# Patient Record
Sex: Male | Born: 1965 | Race: Black or African American | Hispanic: No | Marital: Single | Smoking: Light tobacco smoker
Health system: Southern US, Community
[De-identification: ages and names within clinical notes are randomized; demographics above are authoritative.]

## PROBLEM LIST (undated history)

## (undated) DIAGNOSIS — M5430 Sciatica, unspecified side: Secondary | ICD-10-CM

---

## 2018-11-02 ENCOUNTER — Other Ambulatory Visit: Payer: Self-pay

## 2018-11-02 ENCOUNTER — Emergency Department (HOSPITAL_BASED_OUTPATIENT_CLINIC_OR_DEPARTMENT_OTHER)
Admission: EM | Admit: 2018-11-02 | Discharge: 2018-11-02 | Disposition: A | Discharge status: Home / Self Care | Payer: Self-pay | Attending: Emergency Medicine | Admitting: Emergency Medicine

## 2018-11-02 ENCOUNTER — Encounter (HOSPITAL_BASED_OUTPATIENT_CLINIC_OR_DEPARTMENT_OTHER): Payer: Self-pay | Admitting: Emergency Medicine

## 2018-11-02 DIAGNOSIS — M5431 Sciatica, right side: Secondary | ICD-10-CM | POA: Insufficient documentation

## 2018-11-02 DIAGNOSIS — M5432 Sciatica, left side: Secondary | ICD-10-CM | POA: Insufficient documentation

## 2018-11-02 DIAGNOSIS — Z720 Tobacco use: Secondary | ICD-10-CM | POA: Insufficient documentation

## 2018-11-02 HISTORY — DX: Sciatica, unspecified side: M54.30

## 2018-11-02 MED ORDER — OXYCODONE-ACETAMINOPHEN 5-325 MG PO TABS: 1.0000 | ORAL_TABLET | ORAL | 0 refills | Status: DC | PRN

## 2018-11-02 MED ORDER — KETOROLAC TROMETHAMINE 30 MG/ML IJ SOLN
30.0000 mg | Freq: Once | INTRAMUSCULAR | Status: AC
  Administered 2018-11-02: 30 mg via INTRAMUSCULAR
  Filled 2018-11-02: qty 1

## 2018-11-02 MED ORDER — MELOXICAM 15 MG PO TABS: ORAL_TABLET | ORAL | 0 refills | Status: DC

## 2018-11-02 NOTE — ED Provider Notes (Signed)
[facility] Provider Note: Lowella Dell, MD, FACEP  CSN: [id number] MRN: [id number] ARRIVAL: 11/02/18 at 0607 ROOM: [facility]   CHIEF COMPLAINT  Back Pain   HISTORY OF PRESENT ILLNESS  11/02/18 6:37 AM Joseph Reeves is a 53 y.o. male with a history of sciatica.  He is here with low back pain that began about a week ago.  The onset was gradual but it is acutely worsened over the past 2 days.  He denies any specific injury or lifting that precipitated this.  The pain is located in his lumbar region bilaterally radiating into his buttocks bilaterally.  He rates his pain as a 10 out of 10 at its worst.  Pain is exacerbated with movement but not with palpation.  He denies any associated numbness, weakness, bowel or bladder changes.  He has been taking ibuprofen without adequate relief.   Past Medical History:  Diagnosis Date  . Sciatica     History reviewed. No pertinent surgical history.  History reviewed. No pertinent family history.  Social History   Tobacco Use  . Smoking status: Light Tobacco Smoker  . Smokeless tobacco: Never Used  Substance Use Topics  . Alcohol use: Never    Frequency: Never  . Drug use: Never    Prior to Admission medications   Not on File    Allergies Patient has no known allergies.   REVIEW OF SYSTEMS  Negative except as noted here or in the History of Present Illness.   PHYSICAL EXAMINATION  Initial Vital Signs Blood pressure (!) 143/82, pulse 76, temperature 99 F (37.2 C), temperature source Oral, resp. rate 16, height 6\' 1"  (1.854 m), weight 106.6 kg, SpO2 100 %.  Examination General: Well-developed, well-nourished male in no acute distress; appearance consistent with age of record HENT: normocephalic; atraumatic Eyes: pupils equal, round and reactive to light; extraocular muscles intact; mild arcus senilis bilaterally Neck: supple Heart: regular rate and rhythm Lungs: clear to auscultation bilaterally  Abdomen: soft; nondistended; nontender; bowel sounds present Back: Nontender; positive straight leg raise bilaterally at 35 degrees Extremities: No deformity; full range of motion; pulses normal Neurologic: Awake, alert and oriented; motor function intact in all extremities and symmetric; sensation intact and symmetrical in lower extremities; no saddle anesthesia; no facial droop Skin: Warm and dry Psychiatric: Normal mood and affect   RESULTS  Summary of this visit's results, reviewed by myself:   EKG Interpretation  Date/Time:    Ventricular Rate:    PR Interval:    QRS Duration:   QT Interval:    QTC Calculation:   R Axis:     Text Interpretation:        Laboratory Studies: No results found for this or any previous visit (from the past 24 hour(s)). Imaging Studies: No results found.  ED COURSE and MDM  Nursing notes and initial vitals signs, including pulse oximetry, reviewed.  Vitals:   11/02/18 0619 11/02/18 0622  BP:  (!) 143/82  Pulse:  76  Resp:  16  Temp:  99 F (37.2 C)  TempSrc:  Oral  SpO2:  100%  Weight: 106.6 kg   Height: 6\' 1"  (1.854 m)    Will treat with an NSAID and narcotic and refer to sports medicine if symptoms persist.  Examination and history consistent with acute sciatica.  He has had this before and symptoms were similar.  He has no history of prostate cancer.  PROCEDURES    ED DIAGNOSES     ICD-10-CM  1. Bilateral sciatica  M54.31    M54.32        Joseph Rosser, MD 11/02/18 [phone]

## 2018-11-02 NOTE — ED Triage Notes (Signed)
Patient complains of lower back pain radiating into bilateral legs; more so on right side. States onset 2 days ago; worse this am; Difficulty with ambulation due to pain.

## 2018-11-04 ENCOUNTER — Encounter (HOSPITAL_BASED_OUTPATIENT_CLINIC_OR_DEPARTMENT_OTHER): Payer: Self-pay | Admitting: Emergency Medicine

## 2018-11-04 ENCOUNTER — Other Ambulatory Visit: Payer: Self-pay

## 2018-11-04 ENCOUNTER — Emergency Department (HOSPITAL_BASED_OUTPATIENT_CLINIC_OR_DEPARTMENT_OTHER): Payer: Self-pay

## 2018-11-04 ENCOUNTER — Emergency Department (HOSPITAL_BASED_OUTPATIENT_CLINIC_OR_DEPARTMENT_OTHER)
Admission: EM | Admit: 2018-11-04 | Discharge: 2018-11-04 | Disposition: A | Discharge status: Home / Self Care | Payer: Self-pay | Attending: Emergency Medicine | Admitting: Emergency Medicine

## 2018-11-04 DIAGNOSIS — M5416 Radiculopathy, lumbar region: Secondary | ICD-10-CM | POA: Insufficient documentation

## 2018-11-04 DIAGNOSIS — Z720 Tobacco use: Secondary | ICD-10-CM | POA: Insufficient documentation

## 2018-11-04 DIAGNOSIS — M48061 Spinal stenosis, lumbar region without neurogenic claudication: Secondary | ICD-10-CM | POA: Insufficient documentation

## 2018-11-04 IMAGING — MR MR LUMBAR SPINE W/O CM
4 of 5 series · 24 of 48 positions shown · non-contrast
Comparison: None.

CLINICAL DATA: Severe low back pain with bilateral lower extremity
numbness

EXAM:
MRI LUMBAR SPINE WITHOUT CONTRAST
TECHNIQUE: Multiplanar, multisequence MR imaging of the lumbar spine was
performed. No intravenous contrast was administered.

[Series 2: T1 · sagittal · 4.0mm · 0.88mm/px · 5 of 16 slices shown (1 of 2)]
[im 1/16]
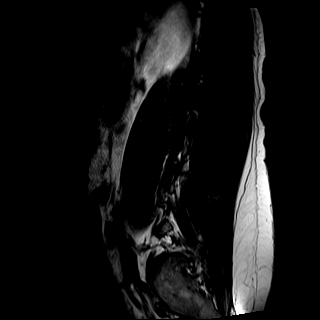
[im 4/16]
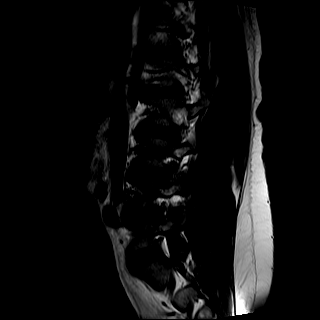
[im 8/16]
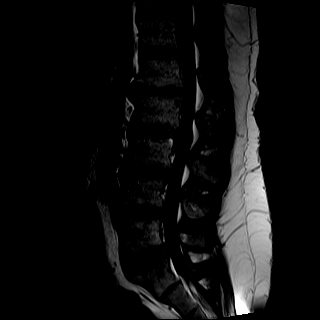
[im 12/16]
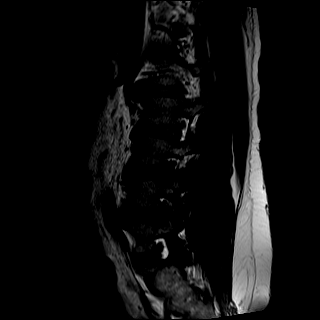
[im 16/16]
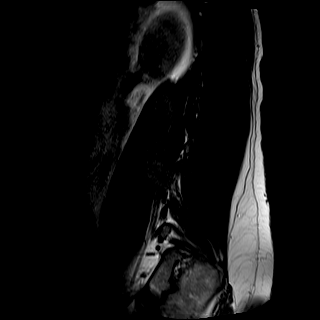

[Series 3: T2 · sagittal · 4.0mm · 0.88mm/px · 5 of 16 slices shown (1 of 2)]
[im 1/16]
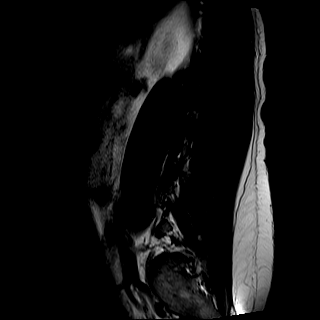
[im 4/16]
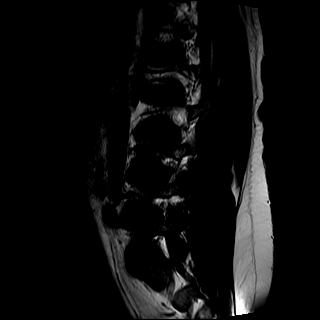
[im 8/16]
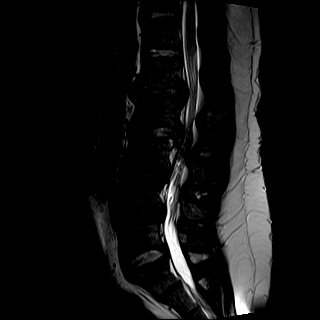
[im 12/16]
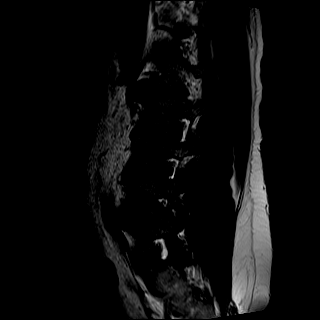
[im 16/16]
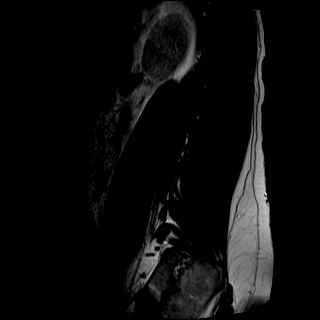

[Series 5: T2 · axial · 4.0mm · 0.39mm/px · z∈[-104,+100]mm · 10 of 44 slices shown (2 of 2)]
[im 3/44]
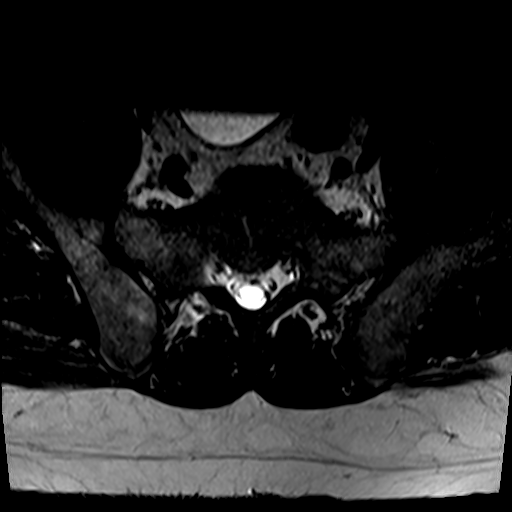
[im 6/44]
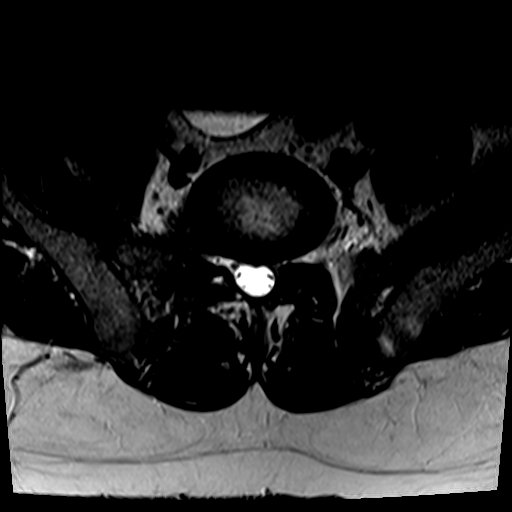
[im 9/44]
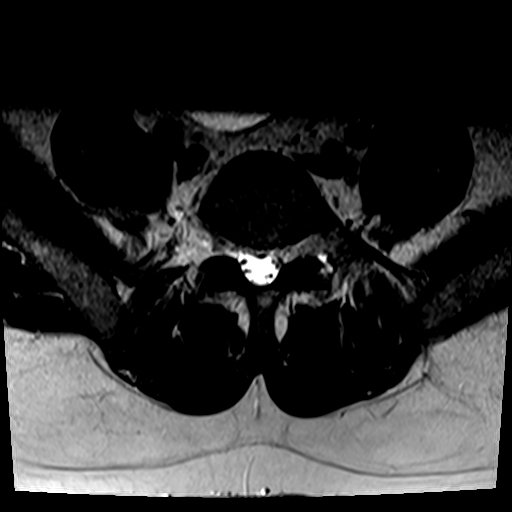
[im 15/44]
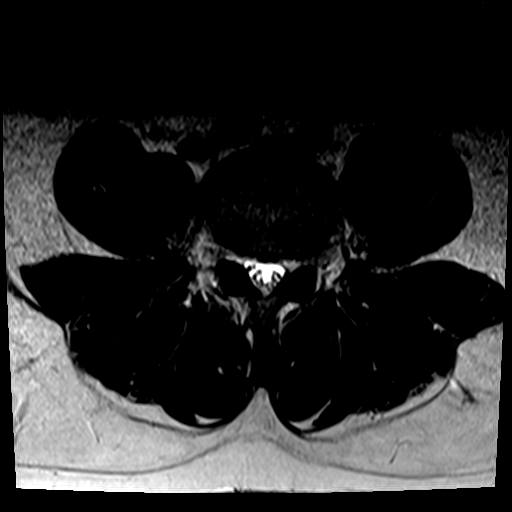
[im 21/44]
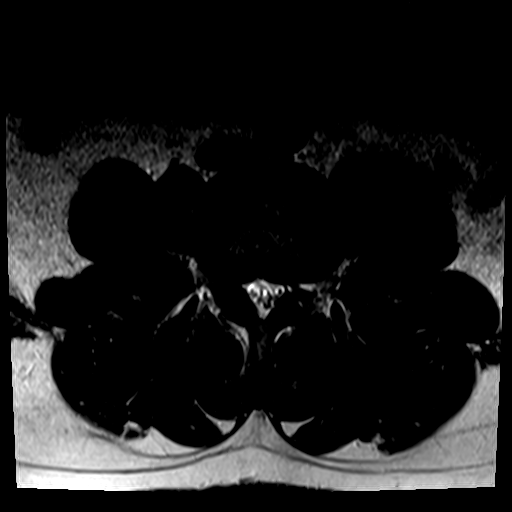
[im 23/44]
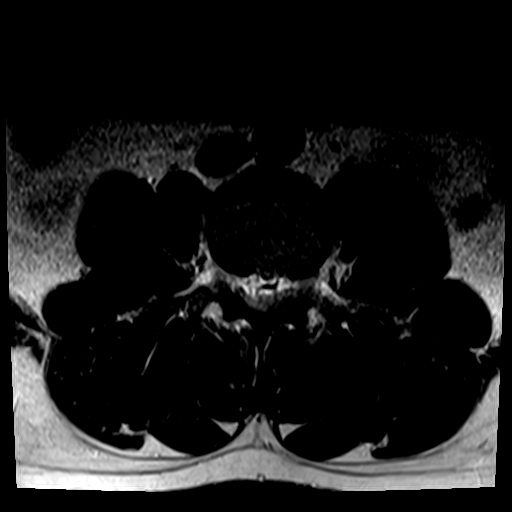
[im 26/44]
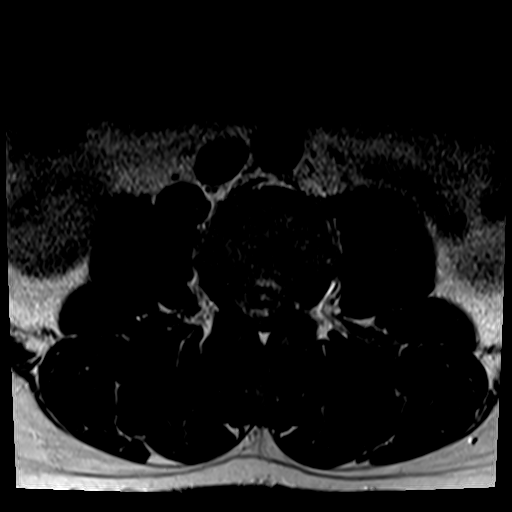
[im 32/44]
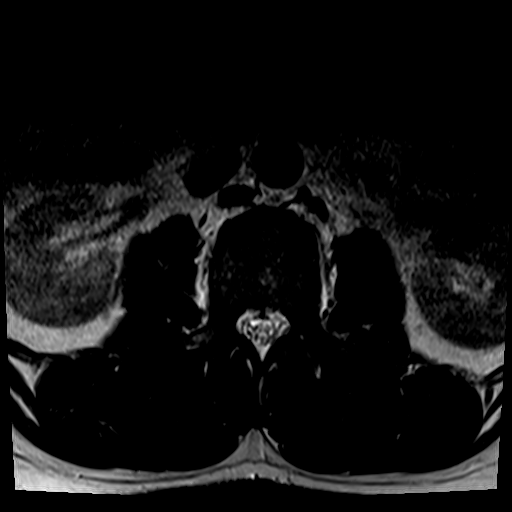
[im 38/44]
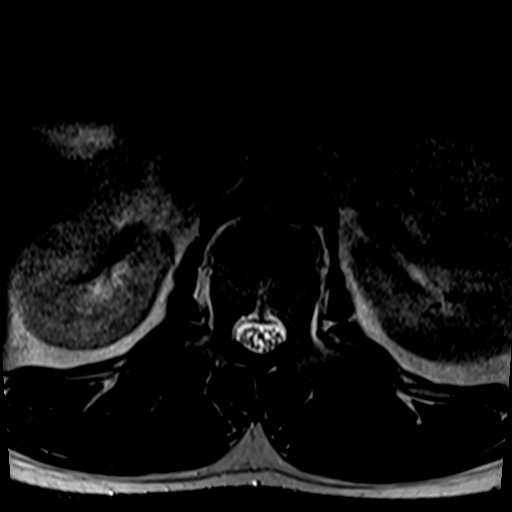
[im 44/44]
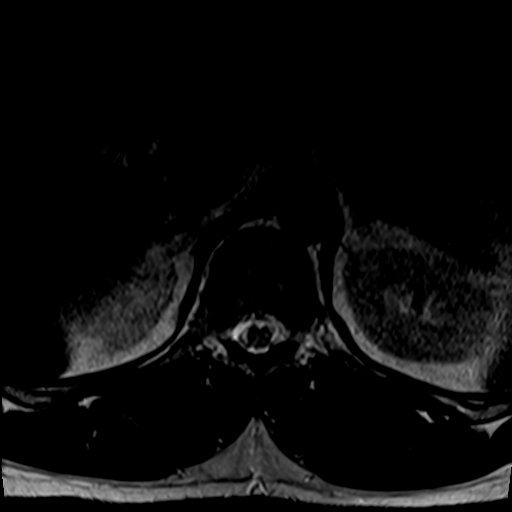

[Series 6: T1 · axial · 4.0mm · 0.39mm/px · z∈[-104,+70]mm · 4 of 44 slices shown (2 of 2)]
[im 3/44]
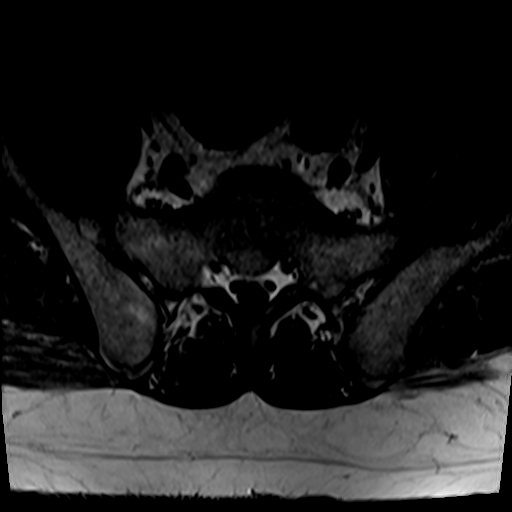
[im 6/44]
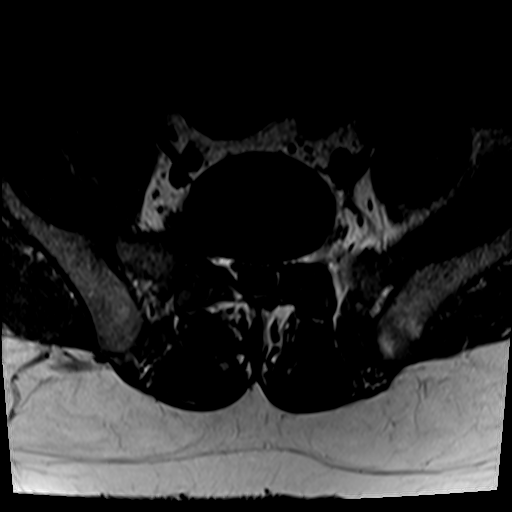
[im 23/44]
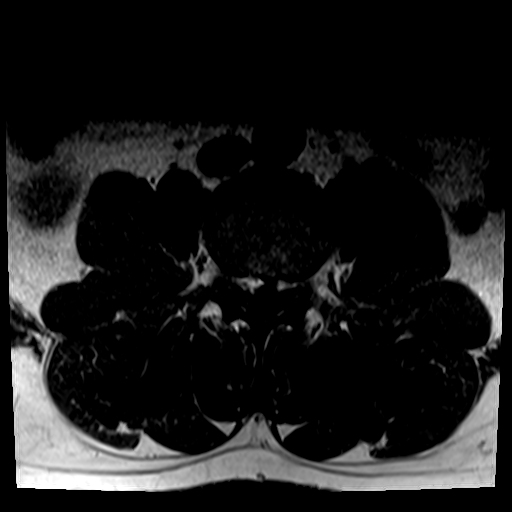
[im 38/44]
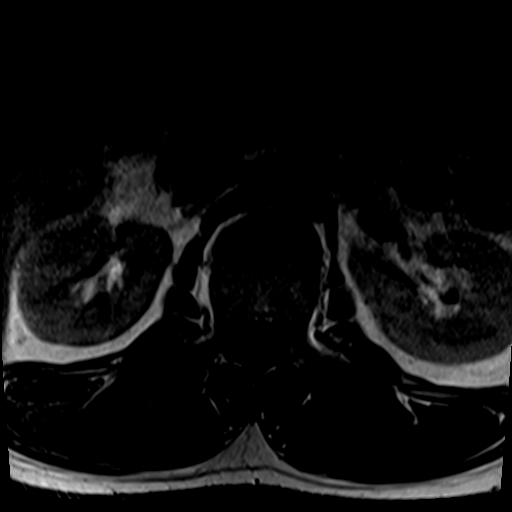

[24 of 48 positions shown; findings below may reference images not displayed]

FINDINGS: Segmentation:  Standard.

Alignment: Straightening of the lumbar lordosis without static
listhesis.

Vertebrae: No fracture, evidence of discitis, or bone lesion. Mild
diffuse intrinsic canal narrowing on the basis of congenitally short
pedicles. Multilevel degenerative endplate spurring.

Conus medullaris and cauda equina: Conus extends to the L1 level.
Conus and cauda equina appear normal.

Paraspinal and other soft tissues: Partially visualized large T2
hyperintense structure within the pelvis, likely a distended urinary
bladder.

Disc levels:

T12-L1: Mild bilateral facet arthrosis. No disc protrusion. No
foraminal or canal stenosis.

L1-L2: Diffuse disc bulge with endplate spurring and mild bilateral
facet arthrosis results in mild canal stenosis and minimal bilateral
foraminal narrowing without evidence of neural impingement.

L2-L3: Central disc extrusion with 6 mm caudal extension.
Degenerative endplate spurring and mild bilateral facet arthrosis
contribute to severe canal stenosis and moderate right foraminal
stenosis.

L3-L4: Diffuse disc bulge with endplate spurring and mild bilateral
facet arthrosis resulting in severe right and moderate left
foraminal stenosis. Mild canal stenosis.

L4-L5: Diffuse disc bulge and degenerative endplate spurring with
mild bilateral facet arthrosis result in severe bilateral foraminal
stenosis with mild canal stenosis.

L5-S1: Small central disc protrusion and mild bilateral facet
arthrosis no significant foraminal or canal stenosis.
IMPRESSION: 1. Congenitally narrow canal with superimposed lumbar spondylosis
resulting in severe canal stenosis at L2-L3 and mild canal stenosis
at L1-2, L3-L4, and L4-L5.
2. Multilevel foraminal stenosis, severe on the right at L3-4 and
bilaterally at L4-5.
3. Partially visualized large T2 hyperintense structure within the
pelvis, likely a distended urinary bladder.

## 2018-11-04 MED ORDER — DIAZEPAM 5 MG PO TABS
5.0000 mg | ORAL_TABLET | Freq: Once | ORAL | Status: AC
  Administered 2018-11-04: 5 mg via ORAL
  Filled 2018-11-04: qty 1

## 2018-11-04 MED ORDER — PREDNISONE 20 MG PO TABS: ORAL_TABLET | ORAL | 0 refills | Status: DC

## 2018-11-04 MED ORDER — PREDNISONE 50 MG PO TABS
60.0000 mg | ORAL_TABLET | Freq: Once | ORAL | Status: AC
  Administered 2018-11-04: 60 mg via ORAL
  Filled 2018-11-04: qty 1

## 2018-11-04 MED ORDER — DIAZEPAM 5 MG PO TABS: 5.0000 mg | ORAL_TABLET | Freq: Four times a day (QID) | ORAL | 0 refills | Status: DC | PRN

## 2018-11-04 MED ORDER — HYDROMORPHONE HCL 1 MG/ML IJ SOLN
1.0000 mg | Freq: Once | INTRAMUSCULAR | Status: AC
  Administered 2018-11-04: 14:00:00 1 mg via INTRAVENOUS
  Filled 2018-11-04: qty 1

## 2018-11-04 NOTE — ED Notes (Signed)
Patient transported to MRI 

## 2018-11-04 NOTE — Discharge Instructions (Signed)
Continue oxycodone for pain as prescribed previously   Take prednisone as prescribed   Take valium for muscle spasms   See neurosurgery for follow up. You may need some procedures to help with your back problems.   Return to ER if you have worse back pain, numbness, weakness, trouble urinating

## 2018-11-04 NOTE — ED Provider Notes (Signed)
  Physical Exam  BP 136/73 (BP Location: Left Arm)   Pulse 67   Temp 98.4 F (36.9 C) (Oral)   Resp 18   Ht 6\' 1"  (1.854 m)   Wt 104.3 kg   SpO2 100%   BMI 30.34 kg/m   Physical Exam  ED Course/Procedures   Clinical Course as of Nov 04 1626  Sat Nov 03, 8448  [id number] 53 year old male with progressive worsening of low back pain radiating to his legs with associated numbness and tingling.  I have put him in for an MRI because he has some concerning symptoms.   [MB]    Clinical Course User Index [MB] Joseph Rasmussen, MD    Procedures  MDM  Care assumed at 3:30 pm. Patient here with worse back pain, numbness and tingling. Sign out pending MRI lumbar spine.   4:29 PM MRi lumbar spine showed multi level stenosis. No cord compression. Patient's pain improved with pain meds. He has oxycodone prescribed already. Will dc home with valium prn and prednisone. Will refer to neurosurgery for further management.    Drenda Freeze, MD 11/04/18 [phone]

## 2018-11-04 NOTE — ED Provider Notes (Signed)
[facility] EMERGENCY DEPARTMENT Provider Note   CSN: [id number] Arrival date & time: 11/04/18  1051     History   Chief Complaint No chief complaint on file.   HPI Joseph Reeves is a 53 y.o. male.  He has no significant past medical history.  He said he started with some low back pain radiating into his legs on Monday and has been getting progressively worse.  He was seen here 2 days ago for same and prescribed some pain medication and NSAIDs.  He says his symptoms are getting worse and he is having twitching in his legs along with numbness in his right foot and more difficulty walking.  He has had no bowel or bladder incontinence but he says he feels numb through that area.  Pins-and-needles in his buttocks.  No fevers or chills no abdominal pain.  Denies any IV drug use and not immune suppressed.  No trauma.     The history is provided by the patient.  Back Pain Location:  Gluteal region Quality:  Burning Radiates to:  L thigh, R thigh and R foot Pain severity:  Moderate Pain is:  Same all the time Onset quality:  Gradual Timing:  Constant Progression:  Worsening Chronicity:  Recurrent Context: not falling, not recent illness and not recent injury   Relieved by:  Nothing Worsened by:  Ambulation Ineffective treatments:  Ibuprofen and narcotics Associated symptoms: leg pain, numbness, paresthesias, perianal numbness and tingling   Associated symptoms: no abdominal pain, no bladder incontinence, no bowel incontinence, no chest pain, no dysuria, no fever and no headaches   Risk factors: no hx of cancer and no steroid use     Past Medical History:  Diagnosis Date  . Sciatica     There are no active problems to display for this patient.   History reviewed. No pertinent surgical history.      Home Medications    Prior to Admission medications   Medication Sig Start Date End Date Taking? Authorizing Provider  meloxicam (MOBIC) 15 MG tablet Take 1  tablet daily for back pain. 11/02/18  Yes Molpus, John, MD  oxyCODONE-acetaminophen (PERCOCET) 5-325 MG tablet Take 1 tablet by mouth every 4 (four) hours as needed for severe pain. 11/02/18  Yes Molpus, John, MD    Family History History reviewed. No pertinent family history.  Social History Social History   Tobacco Use  . Smoking status: Light Tobacco Smoker  . Smokeless tobacco: Never Used  Substance Use Topics  . Alcohol use: Never    Frequency: Never  . Drug use: Never     Allergies   Patient has no known allergies.   Review of Systems Review of Systems  Constitutional: Negative for fever.  HENT: Negative for sore throat.   Eyes: Negative for visual disturbance.  Respiratory: Negative for shortness of breath.   Cardiovascular: Negative for chest pain.  Gastrointestinal: Negative for abdominal pain and bowel incontinence.  Genitourinary: Negative for bladder incontinence and dysuria.  Musculoskeletal: Positive for back pain.  Skin: Negative for rash.  Neurological: Positive for tingling, numbness and paresthesias. Negative for headaches.     Physical Exam Updated Vital Signs BP (!) 143/97 (BP Location: Right Arm)   Pulse 65   Temp 98.8 F (37.1 C) (Oral)   Resp 20   Ht 6\' 1"  (1.854 m)   Wt 104.3 kg   SpO2 97%   BMI 30.34 kg/m   Physical Exam Vitals signs and nursing note reviewed.  Constitutional:  Appearance: He is well-developed.  HENT:     Head: Normocephalic and atraumatic.  Eyes:     Conjunctiva/sclera: Conjunctivae normal.  Neck:     Musculoskeletal: Neck supple.  Cardiovascular:     Rate and Rhythm: Normal rate and regular rhythm.     Heart sounds: No murmur.  Pulmonary:     Effort: Pulmonary effort is normal. No respiratory distress.     Breath sounds: Normal breath sounds.  Abdominal:     Palpations: Abdomen is soft.     Tenderness: There is no abdominal tenderness.  Musculoskeletal: Normal range of motion.  Skin:    General:  Skin is warm and dry.     Capillary Refill: Capillary refill takes less than 2 seconds.  Neurological:     Mental Status: He is alert.     Motor: No weakness.      ED Treatments / Results  Labs (all labs ordered are listed, but only abnormal results are displayed) Labs Reviewed - No data to display  EKG None  Radiology Mr Lumbar Spine Wo Contrast  Result Date: 11/04/2018 CLINICAL DATA:  Severe low back pain with bilateral lower extremity numbness EXAM: MRI LUMBAR SPINE WITHOUT CONTRAST TECHNIQUE: Multiplanar, multisequence MR imaging of the lumbar spine was performed. No intravenous contrast was administered. COMPARISON:  None. FINDINGS: Segmentation:  Standard. Alignment: Straightening of the lumbar lordosis without static listhesis. Vertebrae: No fracture, evidence of discitis, or bone lesion. Mild diffuse intrinsic canal narrowing on the basis of congenitally short pedicles. Multilevel degenerative endplate spurring. Conus medullaris and cauda equina: Conus extends to the L1 level. Conus and cauda equina appear normal. Paraspinal and other soft tissues: Partially visualized large T2 hyperintense structure within the pelvis, likely a distended urinary bladder. Disc levels: T12-L1: Mild bilateral facet arthrosis. No disc protrusion. No foraminal or canal stenosis. L1-L2: Diffuse disc bulge with endplate spurring and mild bilateral facet arthrosis results in mild canal stenosis and minimal bilateral foraminal narrowing without evidence of neural impingement. L2-L3: Central disc extrusion with 6 mm caudal extension. Degenerative endplate spurring and mild bilateral facet arthrosis contribute to severe canal stenosis and moderate right foraminal stenosis. L3-L4: Diffuse disc bulge with endplate spurring and mild bilateral facet arthrosis resulting in severe right and moderate left foraminal stenosis. Mild canal stenosis. L4-L5: Diffuse disc bulge and degenerative endplate spurring with mild  bilateral facet arthrosis result in severe bilateral foraminal stenosis with mild canal stenosis. L5-S1: Small central disc protrusion and mild bilateral facet arthrosis no significant foraminal or canal stenosis. IMPRESSION: 1. 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 2. Multilevel foraminal stenosis, severe on the right at L3-4 and bilaterally at L4-5. 3. Partially visualized large T2 hyperintense structure within the pelvis, likely a distended urinary bladder. Electronically Signed   By: Duanne Guess M.D.   On: 11/04/2018 16:03    Procedures Procedures (including critical care time)  Medications Ordered in ED Medications  diazepam (VALIUM) tablet 5 mg (5 mg Oral Given 11/04/18 1114)  HYDROmorphone (DILAUDID) injection 1 mg (1 mg Intravenous Given 11/04/18 1422)  predniSONE (DELTASONE) tablet 60 mg (60 mg Oral Given 11/04/18 1636)     Initial Impression / Assessment and Plan / ED Course  I have reviewed the triage vital signs and the nursing notes.  Pertinent labs & imaging results that were available during my care of the patient were reviewed by me and considered in my  medical decision making (see chart for details).  Clinical Course as of Nov 03 1701  Sat Nov 04, 2018  [id number] 53 year old male with progressive worsening of low back pain radiating to his legs with associated numbness and tingling.  I have put him in for an MRI because he has some concerning symptoms.   [MB]    Clinical Course User Index [MB] Terrilee FilesButler,  C, MD      Signed out to Dr Silverio LayYao with MRI to followup on. Dispo per results of MRI.   Final Clinical Impressions(s) / ED Diagnoses   Final diagnoses:  Spinal stenosis of lumbar region, unspecified whether neurogenic claudication present  Lumbar radiculopathy    ED Discharge Orders         Ordered    diazepam (VALIUM) 5 MG tablet  Every 6  hours PRN     11/04/18 1631    predniSONE (DELTASONE) 20 MG tablet     11/04/18 1633           Terrilee FilesButler,  C, MD 11/04/18 1705

## 2018-11-04 NOTE — ED Notes (Signed)
ED Provider at bedside. 

## 2018-11-04 NOTE — ED Notes (Signed)
Pt in MRI, family updated

## 2018-11-04 NOTE — ED Triage Notes (Signed)
Pt here with recurring leg cramping and heaviness x 2 days. Muscles in thighs are visibly twitching in triage.

## 2018-11-06 ENCOUNTER — Emergency Department (HOSPITAL_BASED_OUTPATIENT_CLINIC_OR_DEPARTMENT_OTHER): Payer: Self-pay

## 2018-11-06 ENCOUNTER — Other Ambulatory Visit: Payer: Self-pay

## 2018-11-06 ENCOUNTER — Encounter (HOSPITAL_BASED_OUTPATIENT_CLINIC_OR_DEPARTMENT_OTHER): Payer: Self-pay | Admitting: *Deleted

## 2018-11-06 ENCOUNTER — Inpatient Hospital Stay (HOSPITAL_BASED_OUTPATIENT_CLINIC_OR_DEPARTMENT_OTHER)
Admission: EM | Admit: 2018-11-06 | Discharge: 2018-11-13 | DRG: 454 | Disposition: A | Discharge status: Rehabilitation Facility | Payer: Self-pay | Attending: Internal Medicine | Admitting: Internal Medicine

## 2018-11-06 ENCOUNTER — Emergency Department (HOSPITAL_COMMUNITY): Payer: Self-pay

## 2018-11-06 DIAGNOSIS — R202 Paresthesia of skin: Secondary | ICD-10-CM | POA: Diagnosis present

## 2018-11-06 DIAGNOSIS — M5124 Other intervertebral disc displacement, thoracic region: Secondary | ICD-10-CM | POA: Diagnosis present

## 2018-11-06 DIAGNOSIS — Z8249 Family history of ischemic heart disease and other diseases of the circulatory system: Secondary | ICD-10-CM

## 2018-11-06 DIAGNOSIS — R509 Fever, unspecified: Secondary | ICD-10-CM | POA: Diagnosis not present

## 2018-11-06 DIAGNOSIS — R338 Other retention of urine: Secondary | ICD-10-CM | POA: Diagnosis present

## 2018-11-06 DIAGNOSIS — K592 Neurogenic bowel, not elsewhere classified: Secondary | ICD-10-CM

## 2018-11-06 DIAGNOSIS — M48062 Spinal stenosis, lumbar region with neurogenic claudication: Principal | ICD-10-CM | POA: Diagnosis present

## 2018-11-06 DIAGNOSIS — M5186 Other intervertebral disc disorders, lumbar region: Secondary | ICD-10-CM | POA: Diagnosis present

## 2018-11-06 DIAGNOSIS — N312 Flaccid neuropathic bladder, not elsewhere classified: Secondary | ICD-10-CM

## 2018-11-06 DIAGNOSIS — R29898 Other symptoms and signs involving the musculoskeletal system: Secondary | ICD-10-CM | POA: Diagnosis present

## 2018-11-06 DIAGNOSIS — D62 Acute posthemorrhagic anemia: Secondary | ICD-10-CM | POA: Diagnosis not present

## 2018-11-06 DIAGNOSIS — R200 Anesthesia of skin: Secondary | ICD-10-CM | POA: Diagnosis present

## 2018-11-06 DIAGNOSIS — Z419 Encounter for procedure for purposes other than remedying health state, unspecified: Secondary | ICD-10-CM

## 2018-11-06 DIAGNOSIS — G834 Cauda equina syndrome: Secondary | ICD-10-CM

## 2018-11-06 DIAGNOSIS — Z20828 Contact with and (suspected) exposure to other viral communicable diseases: Secondary | ICD-10-CM | POA: Diagnosis present

## 2018-11-06 DIAGNOSIS — M47816 Spondylosis without myelopathy or radiculopathy, lumbar region: Secondary | ICD-10-CM | POA: Diagnosis present

## 2018-11-06 DIAGNOSIS — F17290 Nicotine dependence, other tobacco product, uncomplicated: Secondary | ICD-10-CM | POA: Diagnosis present

## 2018-11-06 DIAGNOSIS — M5126 Other intervertebral disc displacement, lumbar region: Secondary | ICD-10-CM | POA: Diagnosis present

## 2018-11-06 LAB — CBC WITH DIFFERENTIAL/PLATELET
Abs Immature Granulocytes: 0.03 10*3/uL (ref 0.00–0.07)
Basophils Absolute: 0 10*3/uL (ref 0.0–0.1)
Basophils Relative: 0 %
Eosinophils Absolute: 0 10*3/uL (ref 0.0–0.5)
Eosinophils Relative: 0 %
HCT: 39.4 % (ref 39.0–52.0)
Hemoglobin: 12.7 g/dL — ABNORMAL LOW (ref 13.0–17.0)
Immature Granulocytes: 0 %
Lymphocytes Relative: 10 %
Lymphs Abs: 1.4 10*3/uL (ref 0.7–4.0)
MCH: 31.5 pg (ref 26.0–34.0)
MCHC: 32.2 g/dL (ref 30.0–36.0)
MCV: 97.8 fL (ref 80.0–100.0)
Monocytes Absolute: 0.9 10*3/uL (ref 0.1–1.0)
Monocytes Relative: 7 %
Neutro Abs: 11.2 10*3/uL — ABNORMAL HIGH (ref 1.7–7.7)
Neutrophils Relative %: 83 %
Platelets: 261 10*3/uL (ref 150–400)
RBC: 4.03 MIL/uL — ABNORMAL LOW (ref 4.22–5.81)
RDW: 12.6 % (ref 11.5–15.5)
WBC: 13.5 10*3/uL — ABNORMAL HIGH (ref 4.0–10.5)
nRBC: 0 % (ref 0.0–0.2)

## 2018-11-06 LAB — COMPREHENSIVE METABOLIC PANEL
ALT: 22 U/L (ref 0–44)
AST: 26 U/L (ref 15–41)
Albumin: 4.4 g/dL (ref 3.5–5.0)
Alkaline Phosphatase: 35 U/L — ABNORMAL LOW (ref 38–126)
Anion gap: 12 (ref 5–15)
BUN: 27 mg/dL — ABNORMAL HIGH (ref 6–20)
CO2: 27 mmol/L (ref 22–32)
Calcium: 9.1 mg/dL (ref 8.9–10.3)
Chloride: 102 mmol/L (ref 98–111)
Creatinine, Ser: 1.11 mg/dL (ref 0.61–1.24)
GFR calc Af Amer: >60 mL/min (ref >60)
GFR calc non Af Amer: >60 mL/min (ref >60)
Glucose, Bld: 130 mg/dL — ABNORMAL HIGH (ref 70–99)
Potassium: 3.7 mmol/L (ref 3.5–5.1)
Sodium: 141 mmol/L (ref 135–145)
Total Bilirubin: 0.5 mg/dL (ref 0.3–1.2)
Total Protein: 7.7 g/dL (ref 6.5–8.1)

## 2018-11-06 LAB — LIPASE, BLOOD: Lipase: 22 U/L (ref 11–51)

## 2018-11-06 LAB — URINALYSIS, ROUTINE W REFLEX MICROSCOPIC
Bilirubin Urine: NEGATIVE
Glucose, UA: NEGATIVE mg/dL
Ketones, ur: NEGATIVE mg/dL
Leukocytes,Ua: NEGATIVE
Nitrite: NEGATIVE
Protein, ur: NEGATIVE mg/dL
Specific Gravity, Urine: >1.03 — ABNORMAL HIGH (ref 1.005–1.030)
pH: 5.5 (ref 5.0–8.0)

## 2018-11-06 LAB — RAPID URINE DRUG SCREEN, HOSP PERFORMED
Amphetamines: NOT DETECTED
Barbiturates: NOT DETECTED
Benzodiazepines: POSITIVE — AB
Cocaine: NOT DETECTED
Opiates: NOT DETECTED
Tetrahydrocannabinol: NOT DETECTED

## 2018-11-06 LAB — SEDIMENTATION RATE: Sed Rate: 15 mm/hr (ref 0–16)

## 2018-11-06 LAB — URINALYSIS, MICROSCOPIC (REFLEX)

## 2018-11-06 LAB — C-REACTIVE PROTEIN: CRP: <0.8 mg/dL (ref <1.0)

## 2018-11-06 LAB — HIV ANTIBODY (ROUTINE TESTING W REFLEX): HIV Screen 4th Generation wRfx: NONREACTIVE

## 2018-11-06 LAB — SARS CORONAVIRUS 2 BY RT PCR (HOSPITAL ORDER, PERFORMED IN ~~LOC~~ HOSPITAL LAB): SARS Coronavirus 2: NEGATIVE

## 2018-11-06 IMAGING — DX DG CHEST 2V
3 series · 3 of 3 positions shown · non-contrast
Comparison: None

CLINICAL DATA: Leg weakness, light smoker

EXAM:
CHEST - 2 VIEW

[chest lat (1 of 2)]
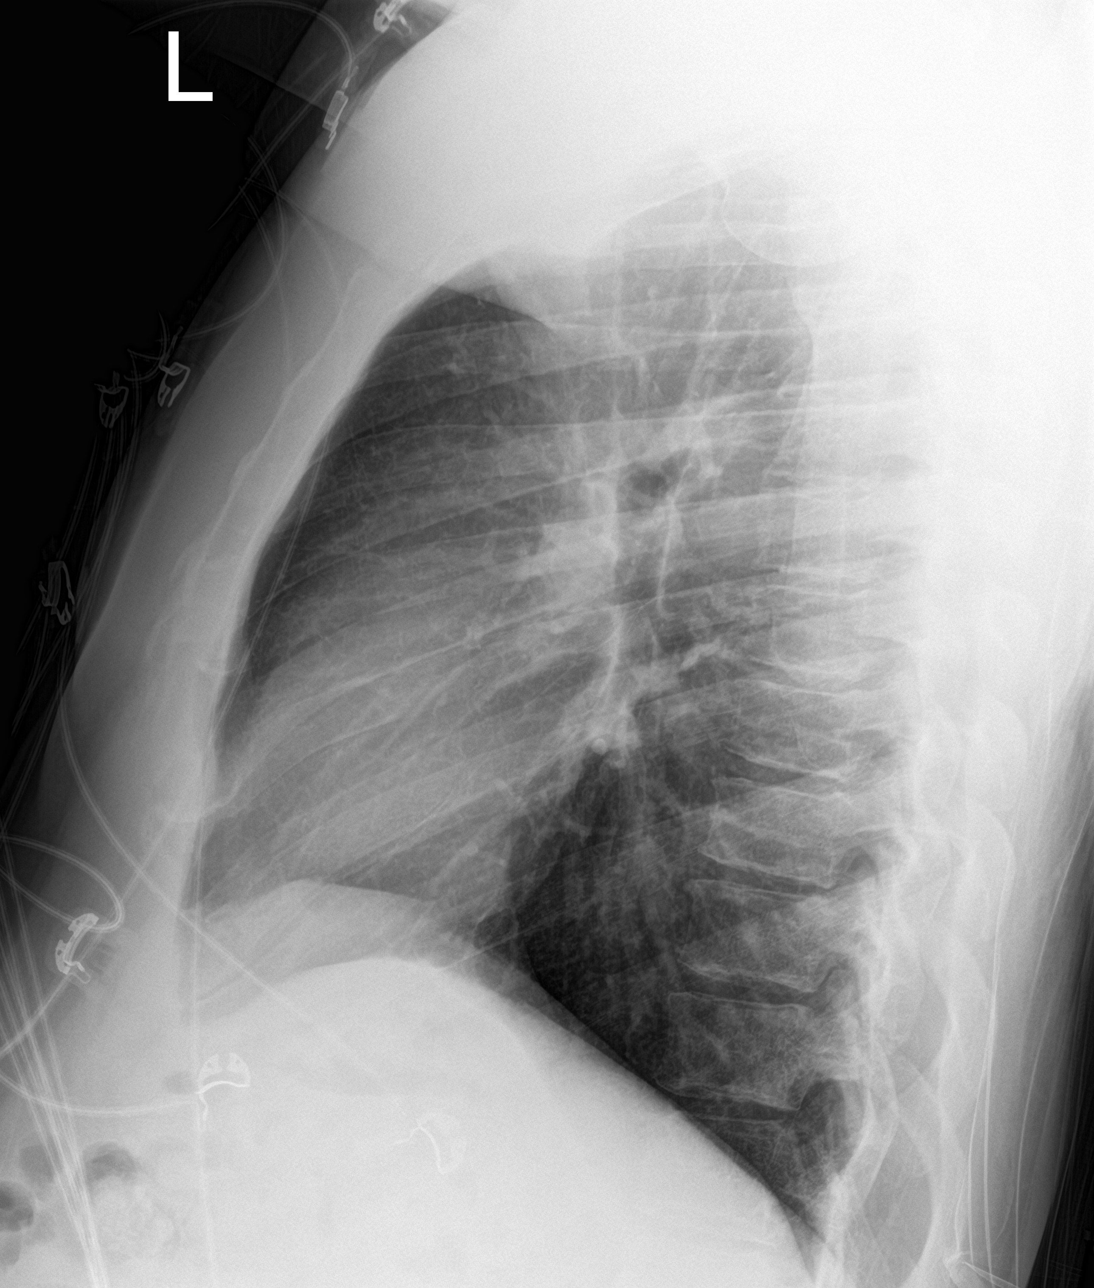

[chest ap strecther]
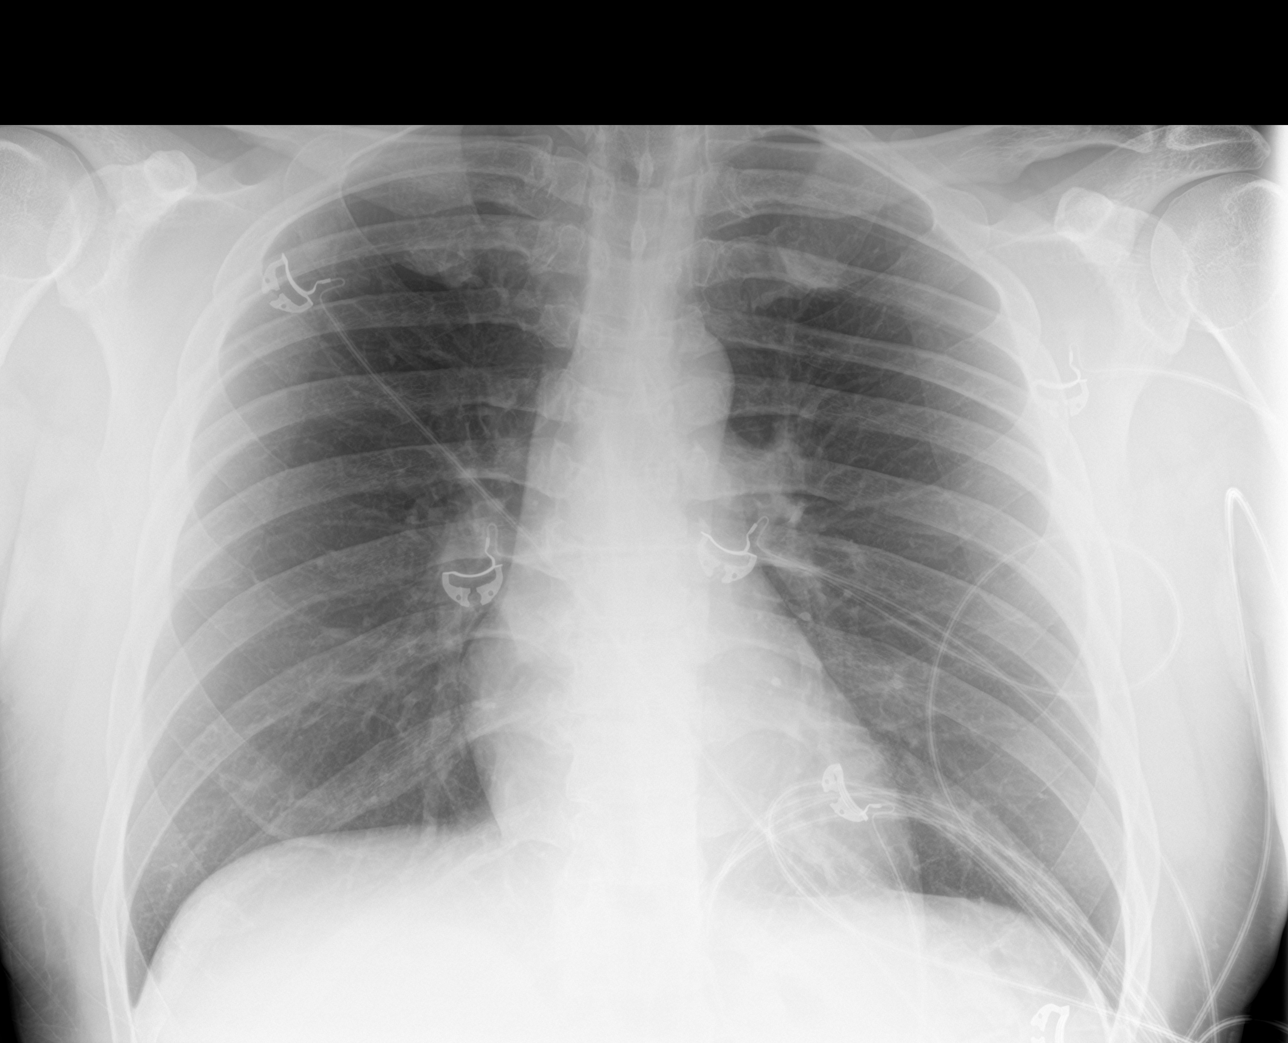

[chest lat (2 of 2)]
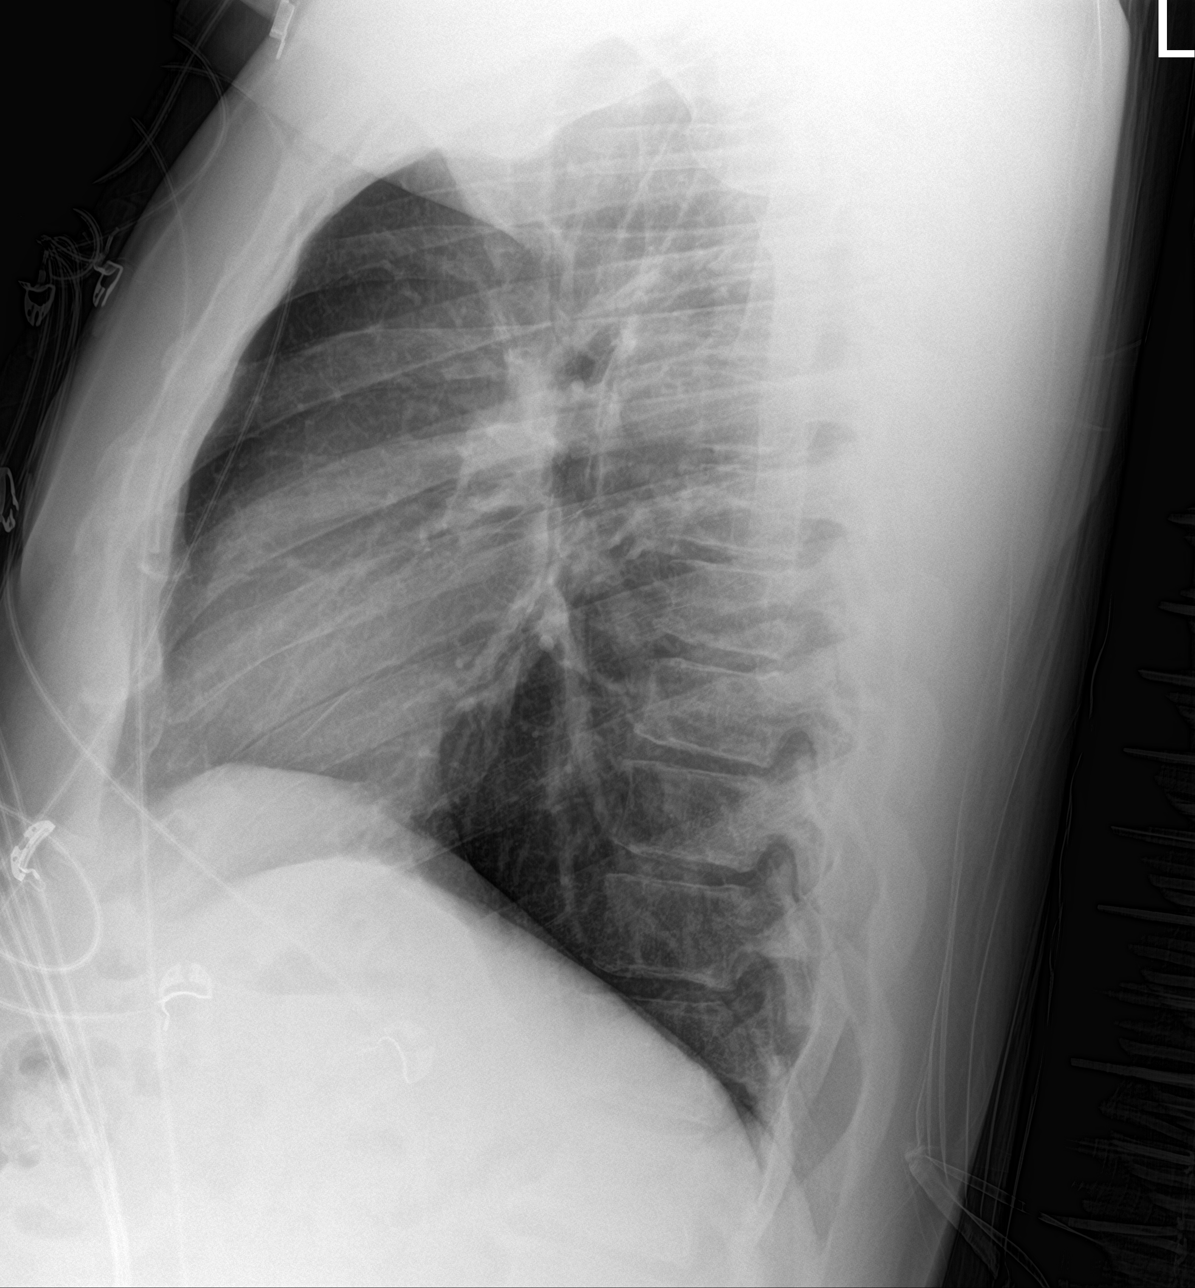

[3 of 3 positions shown; findings below may reference images not displayed]

FINDINGS: Normal heart size, mediastinal contours, and pulmonary vascularity.

Lungs clear.

No pleural effusion or pneumothorax.

Bones unremarkable.
IMPRESSION: Normal exam.  A

## 2018-11-06 IMAGING — CT CT HEAD W/O CM
3 series · 15 of 47 positions shown, 18 images · non-contrast
Comparison: MRI [DATE]

CLINICAL DATA: Lower extremity weakness

EXAM:
CT HEAD WITHOUT CONTRAST
TECHNIQUE: Contiguous axial images were obtained from the base of the skull
through the vertex without intravenous contrast.

[Series 2: head wo · axial · 0.46mm/px · z∈[+834,+960]mm · 9 of 31 slices shown, 12 images]
[im 3/31  brain]
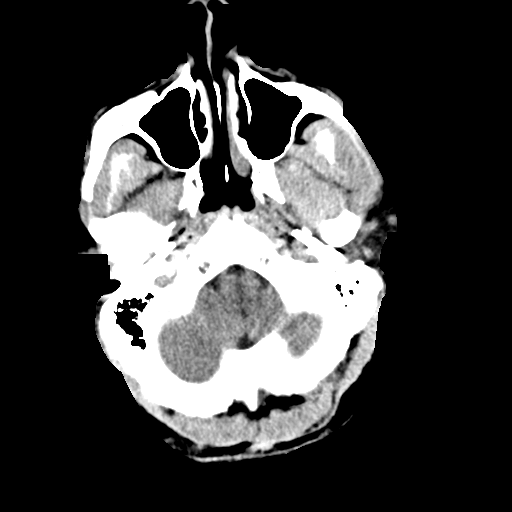
[im 3/31  bone]
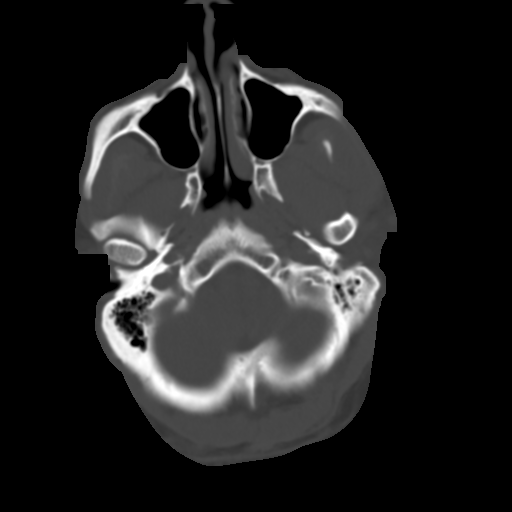
[im 6/31  brain]
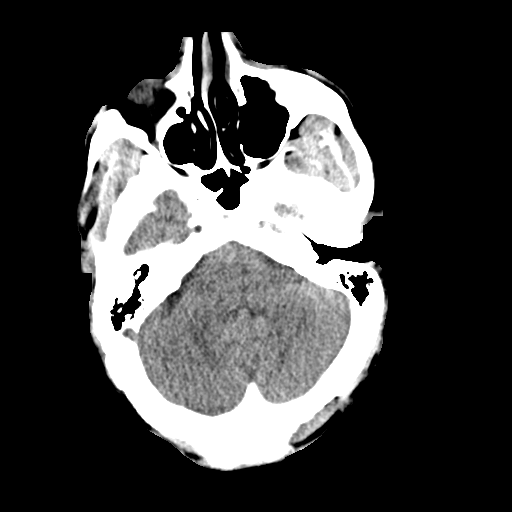
[im 9/31  brain]
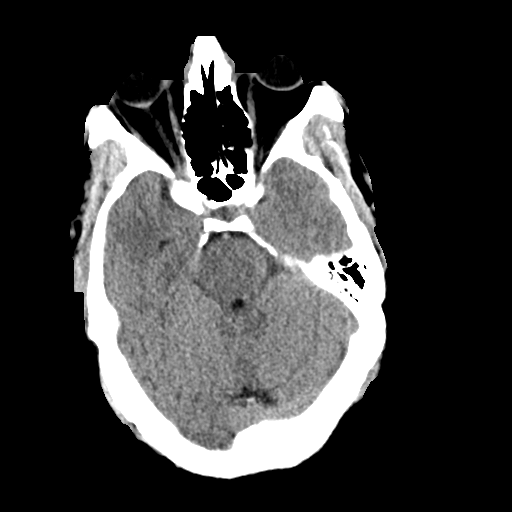
[im 12/31  brain]
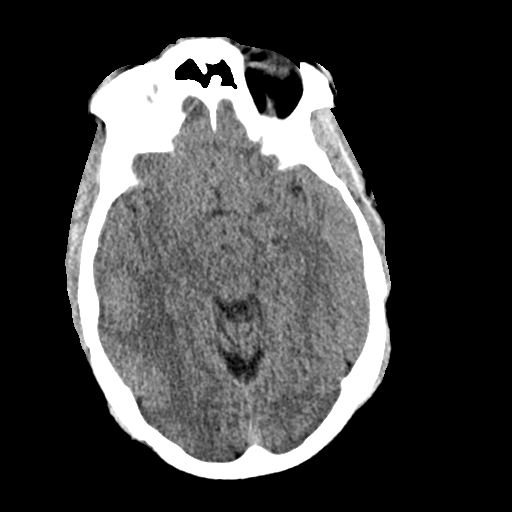
[im 16/31  brain]
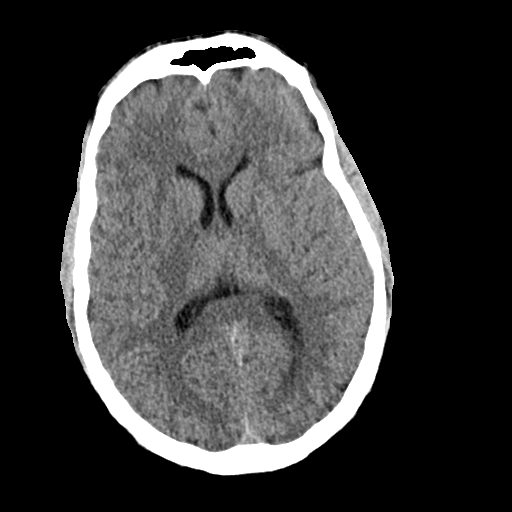
[im 16/31  bone]
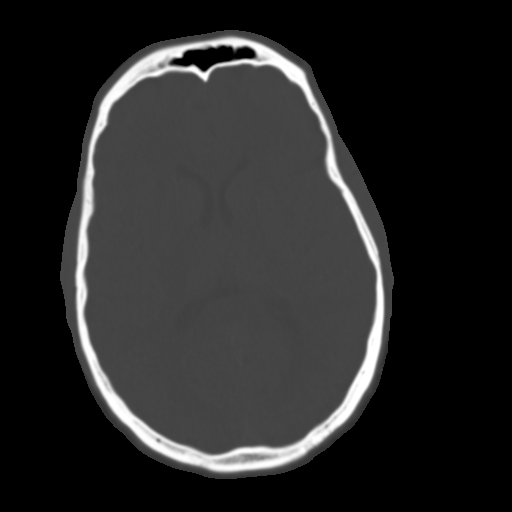
[im 19/31  brain]
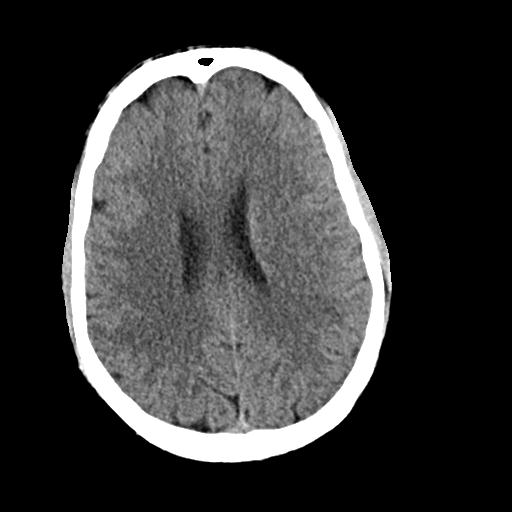
[im 22/31  brain]
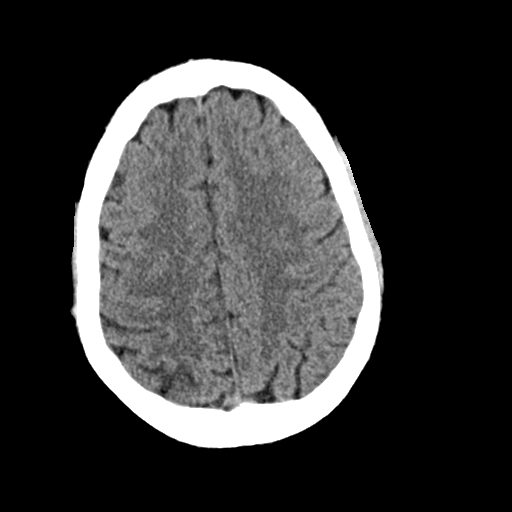
[im 25/31  brain]
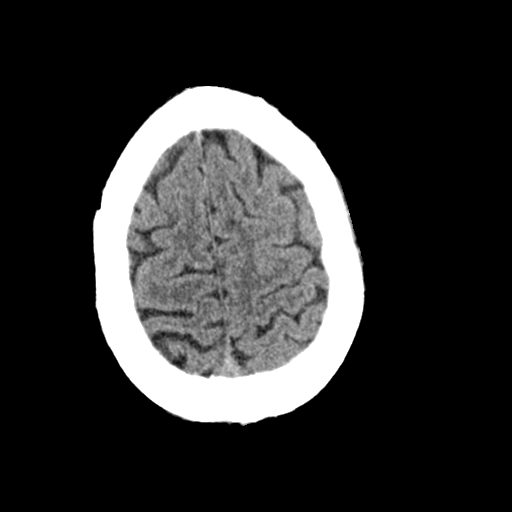
[im 28/31  brain]
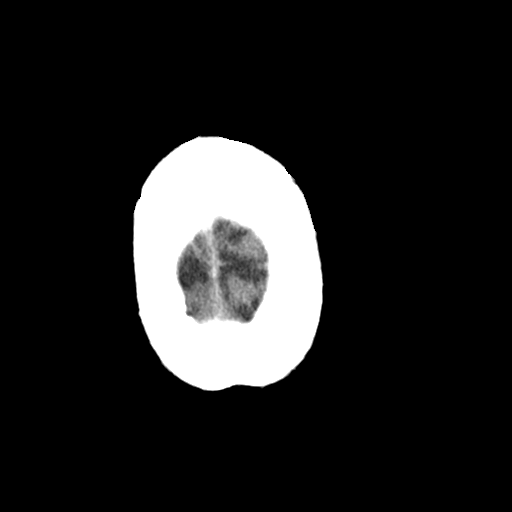
[im 28/31  bone]
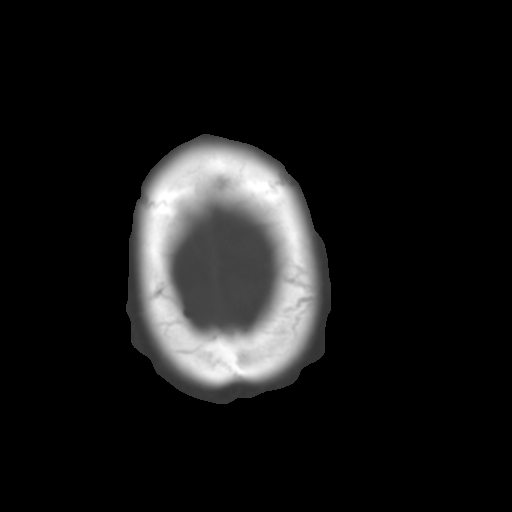

[Series 4: coronal soft · coronal · 0.33mm/px · 3 of 76 slices shown]
[im 26/76  brain]
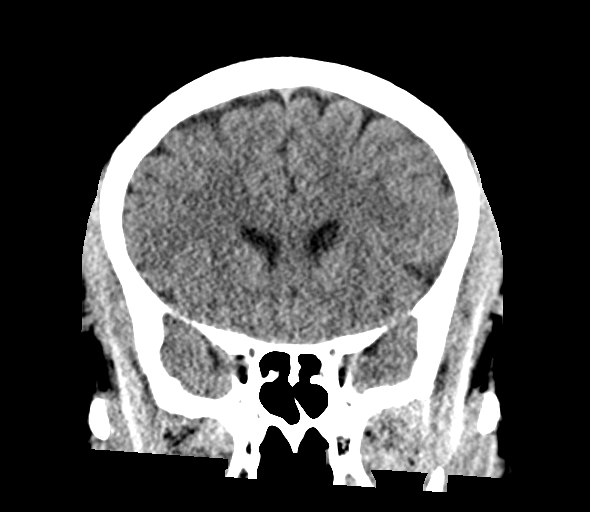
[im 34/76  brain]
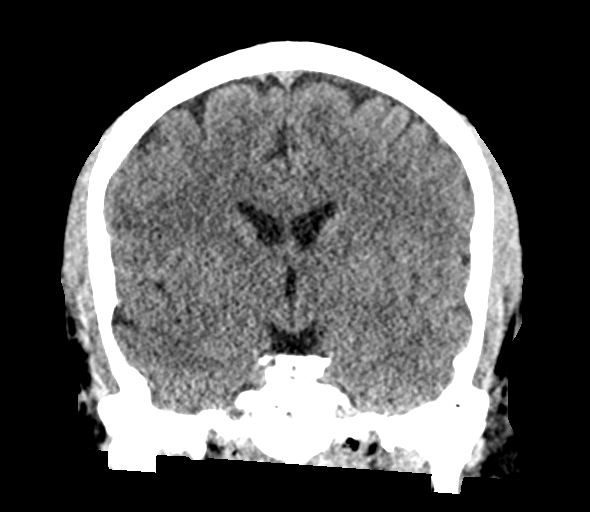
[im 42/76  brain]
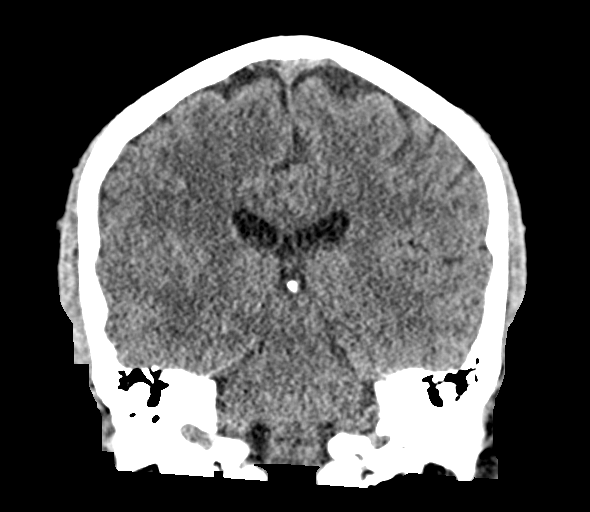

[Series 5: sag soft · sagittal · 0.30mm/px · 3 of 63 slices shown]
[im 21/63  brain]
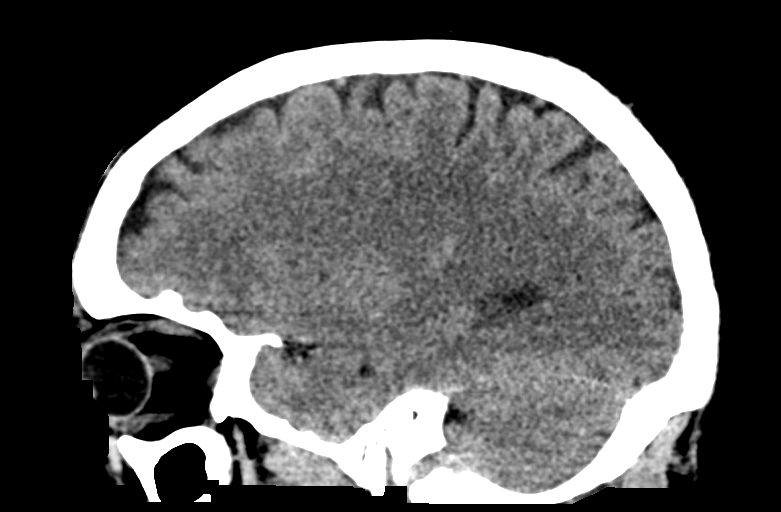
[im 32/63  brain]
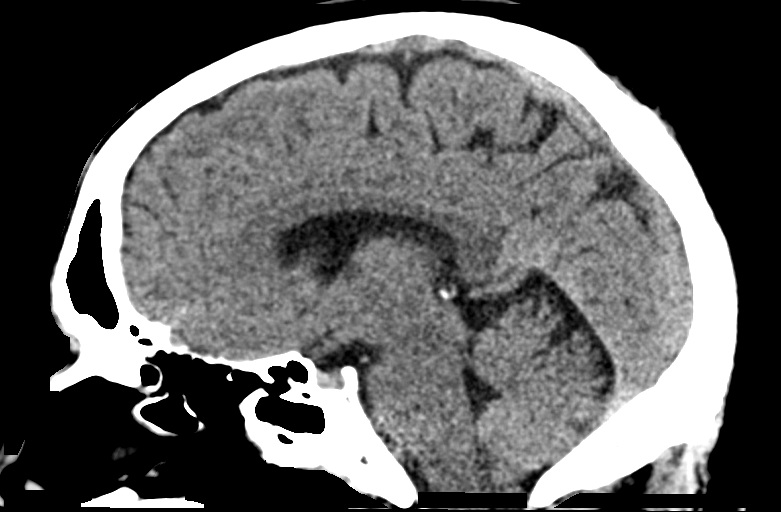
[im 42/63  brain]
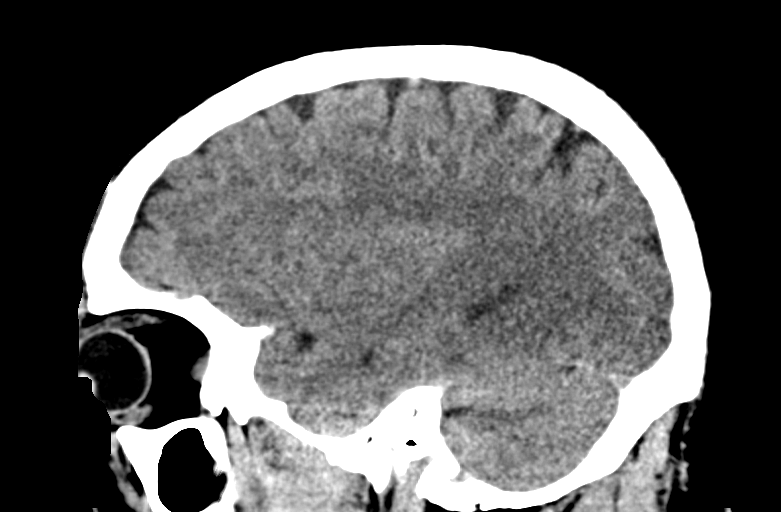

[15 of 47 positions shown; findings below may reference images not displayed]

FINDINGS: Brain: No acute territorial infarction, hemorrhage or intracranial
mass. The ventricles are nonenlarged.

Vascular: No hyperdense vessel or unexpected calcification.

Skull: Normal. Negative for fracture or focal lesion.

Sinuses/Orbits: No acute finding.

Other: None
IMPRESSION: Negative non contrasted CT appearance of the brain.

## 2018-11-06 MED ORDER — LORAZEPAM 2 MG/ML IJ SOLN
1.0000 mg | Freq: Once | INTRAMUSCULAR | Status: AC
  Administered 2018-11-06: 1 mg via INTRAVENOUS
  Filled 2018-11-06: qty 1

## 2018-11-06 MED ORDER — HYDROMORPHONE HCL 1 MG/ML IJ SOLN
1.0000 mg | Freq: Once | INTRAMUSCULAR | Status: AC
  Administered 2018-11-06: 1 mg via INTRAVENOUS
  Filled 2018-11-06: qty 1

## 2018-11-06 MED ORDER — MORPHINE SULFATE (PF) 4 MG/ML IV SOLN
4.0000 mg | Freq: Once | INTRAVENOUS | Status: AC
  Administered 2018-11-06: 21:00:00 4 mg via INTRAVENOUS
  Filled 2018-11-06: qty 1

## 2018-11-06 MED ORDER — KETOROLAC TROMETHAMINE 30 MG/ML IJ SOLN
15.0000 mg | Freq: Once | INTRAMUSCULAR | Status: AC
  Administered 2018-11-06: 23:00:00 15 mg via INTRAVENOUS
  Filled 2018-11-06: qty 1

## 2018-11-06 MED ORDER — HALOPERIDOL LACTATE 5 MG/ML IJ SOLN
2.0000 mg | Freq: Once | INTRAMUSCULAR | Status: AC
  Administered 2018-11-06: 2 mg via INTRAVENOUS
  Filled 2018-11-06: qty 1

## 2018-11-06 MED ORDER — HALOPERIDOL LACTATE 5 MG/ML IJ SOLN: 1.0000 mg | Freq: Once | INTRAMUSCULAR | Status: DC

## 2018-11-06 NOTE — ED Notes (Signed)
No vaccines no tick bites per Pt. Nothing has changed in Pt.

## 2018-11-06 NOTE — ED Provider Notes (Signed)
[facility] EMERGENCY DEPARTMENT Provider Note   CSN: [id number] Arrival date & time: 11/06/18  1339     History   Chief Complaint Chief Complaint  Patient presents with   Back Pain    HPI Joseph Reeves is a 53 y.o. male with no significant past medical history who presents today for evaluation of lower back pain.  He states that over that time it has been getting progressively worse.  He was seen here 2 days ago on 11/04/2018 and 2 days before that on 11/02/2018.  At that time he had reported the pain had been worsening along with numbness in his right foot and difficulty walking with twitching in his legs.  He reports that since his last visit he has become unable to walk due to weakness.  He states that the pain has started it has become less of a concern and he is mostly concerned about the weakness.  States that he has been unable to walk to the bathroom and had to crawl and pull himself across the floor.  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  He denies any IV drug use and is not immunosuppressed.  He denies any abdominal pain.  He denies any tick bites, recent vaccines, illness or other abnormalities.  He denies striking his head or passing out.  He does not have a history of similar.  He denies any headaches or neck pain.  No recent fevers.  He denies any changes to bowel or bladder function.  No trauma.        HPI  Past Medical History:  Diagnosis Date   Sciatica     There are no active problems to display for this patient.   History reviewed. No pertinent surgical history.      Home Medications    Prior to Admission medications   Medication Sig Start Date End Date Taking? Authorizing Provider  diazepam (VALIUM) 5 MG tablet Take 1 tablet (5 mg total) by mouth every 6 (six) hours as needed  for anxiety (spasms). 11/04/18   Drenda Freeze, MD  meloxicam (MOBIC) 15 MG tablet Take 1 tablet daily for back pain. 11/02/18   Molpus, John, MD  oxyCODONE-acetaminophen (PERCOCET) 5-325 MG tablet Take 1 tablet by mouth every 4 (four) hours as needed for severe pain. 11/02/18   Molpus, Jenny Reichmann, MD  predniSONE (DELTASONE) 20 MG tablet Take 60 mg daily x 2 days then 40 mg daily x 2 days then 20 mg daily x 2 days 11/04/18   Drenda Freeze, MD    Family History No family history on file.  Social History Social History   Tobacco Use   Smoking status: Light Tobacco Smoker   Smokeless tobacco: Never Used  Substance Use Topics   Alcohol use: Never    Frequency: Never   Drug use: Never     Allergies   Patient has no known allergies.   Review of Systems Review of Systems  Constitutional: Negative for chills and fever.  HENT: Negative for congestion, ear pain, postnasal drip and sore throat.   Eyes: Negative for visual disturbance.  Respiratory: Negative for cough, chest tightness and shortness of breath.   Cardiovascular: Negative for chest pain.  Gastrointestinal: Negative for abdominal pain, diarrhea, nausea and vomiting.  Genitourinary: Negative for difficulty urinating, dysuria, frequency, hematuria and urgency.  Musculoskeletal: Positive for  back pain. Negative for neck pain.  Skin: Negative for color change and rash.  Neurological: Positive for weakness and numbness. Negative for dizziness, syncope and headaches.  Psychiatric/Behavioral: Negative for confusion.  All other systems reviewed and are negative.    Physical Exam Updated Vital Signs BP (!) 154/62 (BP Location: Left Arm)    Pulse 65    Temp 98.5 F (36.9 C) (Oral)    Resp 20    Ht 6' 1"  (1.854 m)    Wt 104.3 kg    SpO2 99%    BMI 30.34 kg/m   Physical Exam Vitals signs and nursing note reviewed.  Constitutional:      General: He is not in acute distress.    Appearance: He is well-developed.  HENT:      Head: Normocephalic and atraumatic.     Mouth/Throat:     Pharynx: Oropharynx is clear.  Eyes:     Conjunctiva/sclera: Conjunctivae normal.  Neck:     Musculoskeletal: Normal range of motion and neck supple. No neck rigidity or muscular tenderness.  Cardiovascular:     Rate and Rhythm: Normal rate and regular rhythm.     Pulses: Normal pulses.     Heart sounds: No murmur.     Comments: 2+ DP/PT pulses bilaterally. Pulmonary:     Effort: Pulmonary effort is normal. No respiratory distress.     Breath sounds: Normal breath sounds.  Abdominal:     General: Abdomen is flat. Bowel sounds are normal. There is no distension.     Palpations: Abdomen is soft.     Tenderness: There is no abdominal tenderness.  Skin:    General: Skin is warm and dry.  Neurological:     Mental Status: He is alert and oriented to person, place, and time.     Deep Tendon Reflexes:     Reflex Scores:      Patellar reflexes are 0 on the right side and 0 on the left side.    Comments: Mental Status:  Alert, oriented, thought content appropriate, able to give a coherent history. Speech fluent without evidence of aphasia. Able to follow 2 step commands without difficulty.  Cranial Nerves:  II:  Peripheral visual fields grossly normal, pupils equal, round, reactive to light III,IV, VI: ptosis not present, extra-ocular motions intact bilaterally  V,VII: smile symmetric, facial light touch sensation equal VIII: hearing grossly normal to voice  X: uvula elevates symmetrically  XI: bilateral shoulder shrug symmetric and strong XII: midline tongue extension without fassiculations CV: distal pulses palpable throughout  Motor: 5/5 strength bilateral upper extremities. Left leg has 4/5 strength through leg raise, flexion extension at hip, knee, and ankle dorsiflexion plantar flexion. Right leg has 3/5 strength through hip flexion extension, knee flexion extension, and ankle dorsiflexion/plantarflexion. He is  generalized decreased station to light touch below the knees, right leg worse than left.   Sensation intact to bilateral upper legs to light touch. He is unable to stand without significant support due to weakness in his legs. Unable to test gait.   Psychiatric:        Mood and Affect: Mood normal.        Behavior: Behavior normal.      ED Treatments / Results  Labs (all labs ordered are listed, but only abnormal results are displayed) Labs Reviewed  COMPREHENSIVE METABOLIC PANEL - Abnormal; Notable for the following components:      Result Value   Glucose, Bld 130 (*)    BUN  27 (*)    Alkaline Phosphatase 35 (*)    All other components within normal limits  CBC WITH DIFFERENTIAL/PLATELET - Abnormal; Notable for the following components:   WBC 13.5 (*)    RBC 4.03 (*)    Hemoglobin 12.7 (*)    Neutro Abs 11.2 (*)    All other components within normal limits  RAPID URINE DRUG SCREEN, HOSP PERFORMED - Abnormal; Notable for the following components:   Benzodiazepines POSITIVE (*)    All other components within normal limits  URINALYSIS, ROUTINE W REFLEX MICROSCOPIC - Abnormal; Notable for the following components:   Specific Gravity, Urine >1.030 (*)    Hgb urine dipstick TRACE (*)    All other components within normal limits  URINALYSIS, MICROSCOPIC (REFLEX) - Abnormal; Notable for the following components:   Bacteria, UA RARE (*)    All other components within normal limits  SARS CORONAVIRUS 2 BY RT PCR (HOSPITAL ORDER, [place] [facility])  LIPASE, BLOOD  SEDIMENTATION RATE  HIV ANTIBODY (ROUTINE TESTING W REFLEX)  B. BURGDORFI ANTIBODIES  C-REACTIVE PROTEIN  VITAMIN B1    EKG None  Radiology Dg Chest 2 View  Result Date: 11/06/2018 CLINICAL DATA:  Leg weakness, light smoker EXAM: CHEST - 2 VIEW COMPARISON:  None FINDINGS: Normal heart size, mediastinal contours, and pulmonary vascularity. Lungs clear. No pleural effusion or pneumothorax. Bones  unremarkable. IMPRESSION: Normal exam.  A Electronically Signed   By: Lavonia Dana M.D.   On: 11/06/2018 18:18   Ct Head Wo Contrast  Result Date: 11/06/2018 CLINICAL DATA:  Lower extremity weakness EXAM: CT HEAD WITHOUT CONTRAST TECHNIQUE: Contiguous axial images were obtained from the base of the skull through the vertex without intravenous contrast. COMPARISON:  MRI 11/04/2018 FINDINGS: Brain: No acute territorial infarction, hemorrhage or intracranial mass. The ventricles are nonenlarged. Vascular: No hyperdense vessel or unexpected calcification. Skull: Normal. Negative for fracture or focal lesion. Sinuses/Orbits: No acute finding. Other: None IMPRESSION: Negative non contrasted CT appearance of the brain. Electronically Signed   By: Donavan Foil M.D.   On: 11/06/2018 17:53    Procedures Procedures (including critical care time)  Medications Ordered in ED Medications - No data to display   Initial Impression / Assessment and Plan / ED Course  I have reviewed the triage vital signs and the nursing notes.  Pertinent labs & imaging results that were available during my care of the patient were reviewed by me and considered in my medical decision making (see chart for details).  Clinical Course as of Nov 05 1921  Mon Nov 06, 2018  1648 Spoke with Dr. Cheral Marker.  He states patient needs a thoracic spine MRI with and without contrast.  He does not need a repeat lumbar MRI.  No indication for C-spine or head imaging at this time.He states patient does not need a lumbar puncture in addition.   [EH]  1711 I spoke with Dr. Ronnald Nian at [facility] emergency room.  He accepts the patient in transfer.   [EH]    Clinical Course User Index [EH] Lorin Glass, PA-C      Patient presents today for evaluation of bilateral leg weakness and inability to stand.  He has been seen here twice in the past week with a MR lumbar spine showing no evidence of cord compression. Since his last visit 2  days ago his symptoms have progressed to inability to walk and stand due to weakness.  He reports that the pain is  not what is limiting him it is specifically the weakness.  On exam he has 3/5 strength in the right leg with 4/5 strength in the left leg.  Unable to elicit patellar reflexes bilaterally.   He has decreased sensation to light touch over bilateral legs from the knees down.  Labs ordered.    I spoke with Dr. Cheral Marker from neurology who states that patient needs a MR with and without contrast of his T-spine and a lumbar puncture. I spoke with lab at [facility].  They report that here they are unable to do cell count or CSF testing beyond glucose and protein and but the sample would have to be courier to [facility].  CT head Noncon ordered in anticipation of LP.  CT head without evidence of acute abnormalities.  Labs are reviewed showing a leukocytosis at 13.5.  Mild anemia 12.7 with neutrophils of 11.2. CMP does not have significant electrolyte derangements. UDS is positive for benzodiazepines, however he has recently been given a prescription for these for the muscle spasms.  Urine with rare bacteria and trace blood. ESR is not significantly elevated.  Patient will be taken to [facility] by [facility].  Before LP could be performed patient was ready for transfer. I think it is most appropriate for him to get his lumbar puncture at [facility] as the majority of his LP labs would have to be sent by courier over to [facility].  Patient remained hemodynamically stable while in my care.   Final Clinical Impressions(s) / ED Diagnoses   Final diagnoses:  Bilateral leg weakness    ED Discharge Orders    None       Ollen Gross 11/06/18 1927    Tegeler, Gwenyth Allegra, MD 11/07/18 [phone]

## 2018-11-06 NOTE — ED Notes (Signed)
Pt. Reports he feels strong in his torso and has total weakness when he tries to walk/  [facility] and RN tried to stand Pt. At the bedside.  Pt. Unable to stand and try to take even one step.  Pt. Reports his legs do not feel like they belong to him.  Pt. In no pain at present time.   Pt. Able to use his arms and is able to lift the Left leg into the bed but has to use his arms to put the R leg into the bed.

## 2018-11-06 NOTE — ED Provider Notes (Signed)
[facility] EMERGENCY DEPARTMENT Provider Note   CSN: [id number] Arrival date & time: 11/06/18  1339     History   Chief Complaint Chief Complaint  Patient presents with   Back Pain    HPI Joseph Reeves is a 53 y.o. male who presents with progressively worsening back pain and numbness and tingling and weakness to his lower extremities.  Patient is a transfer from [facility] for thoracic MRI and lumbar puncture.  Patient had a lumbar MRI without contrast few days ago which showed some congenital canal narrowing.  Patient denies any difficulty urinating, although he is having to crawl to the bathroom as he feels like his weakness is not allowing him to walk.  Patient denies any fever, chest pain, shortness of breath, abdominal pain, nausea, vomiting, saddle anesthesia.  No history of IVDU.  Lyme titers taken at [facility] as well as vitamin B-1, HIV, and sed rate and CRP.     HPI  Past Medical History:  Diagnosis Date   Sciatica     Patient Active Problem List   Diagnosis Date Noted   Leg weakness 11/07/2018   Numbness and tingling of both legs 11/07/2018    History reviewed. No pertinent surgical history.      Home Medications    Prior to Admission medications   Medication Sig Start Date End Date Taking? Authorizing Provider  diazepam (VALIUM) 5 MG tablet Take 1 tablet (5 mg total) by mouth every 6 (six) hours as needed for anxiety (spasms). 11/04/18  Yes Charlynne Pander, MD  meloxicam (MOBIC) 15 MG tablet Take 1 tablet daily for back pain. 11/02/18  Yes Molpus, John, MD  oxyCODONE-acetaminophen (PERCOCET) 5-325 MG tablet Take 1 tablet by mouth every 4 (four) hours as needed for severe pain. 11/02/18  Yes Molpus, John, MD  predniSONE (DELTASONE) 20 MG tablet Take 60 mg daily x 2 days then 40 mg daily x 2 days then 20 mg daily x 2 days Patient taking differently: Take 20-60 mg by mouth See admin instructions. Take 3  tablets for 2 days then take 2 tablets for 2 days then take 1 tablet for 2 days 11/04/18  Yes Charlynne Pander, MD    Family History Family History  Problem Relation Age of Onset   Hypertension Father    Heart failure Father     Social History Social History   Tobacco Use   Smoking status: Light Tobacco Smoker    Types: Cigars   Smokeless tobacco: Never Used  Substance Use Topics   Alcohol use: Yes    Frequency: Never    Comment: occasional   Drug use: Never     Allergies   Patient has no known allergies.   Review of Systems Review of Systems  Constitutional: Negative for chills and fever.  HENT: Negative for facial swelling and sore throat.   Respiratory: Negative for shortness of breath.   Cardiovascular: Negative for chest pain.  Gastrointestinal: Negative for abdominal pain, nausea and vomiting.  Genitourinary: Negative for dysuria.  Musculoskeletal: Positive for back pain.  Skin: Negative for rash and wound.  Neurological: Positive for weakness and numbness. Negative for headaches.  Psychiatric/Behavioral: The patient is not nervous/anxious.      Physical Exam Updated Vital Signs BP (!) 126/58 (BP Location: Right Arm)    Pulse 69    Temp 98.1 F (36.7 C) (Oral)    Resp 18    Ht  (1.854 m)  Wt 104.3 kg    SpO2 99%    BMI 30.34 kg/m   Physical Exam Vitals signs and nursing note reviewed.  Constitutional:      General: He is not in acute distress.    Appearance: He is well-developed. He is not diaphoretic.  HENT:     Head: Normocephalic and atraumatic.     Mouth/Throat:     Pharynx: No oropharyngeal exudate.  Eyes:     General: No scleral icterus.       Right eye: No discharge.        Left eye: No discharge.     Conjunctiva/sclera: Conjunctivae normal.     Pupils: Pupils are equal, round, and reactive to light.  Neck:     Musculoskeletal: Normal range of motion and neck supple.     Thyroid: No thyromegaly.  Cardiovascular:     Rate  and Rhythm: Normal rate and regular rhythm.     Heart sounds: Normal heart sounds. No murmur. No friction rub. No gallop.   Pulmonary:     Effort: Pulmonary effort is normal. No respiratory distress.     Breath sounds: Normal breath sounds. No stridor. No wheezing or rales.  Abdominal:     General: Bowel sounds are normal. There is no distension.     Palpations: Abdomen is soft.     Tenderness: There is no abdominal tenderness. There is no guarding or rebound.  Musculoskeletal:     Comments: No midline thoracic or lumbar tenderness  Lymphadenopathy:     Cervical: No cervical adenopathy.  Skin:    General: Skin is warm and dry.     Coloration: Skin is not pale.     Findings: No rash.  Neurological:     Mental Status: He is alert.     Coordination: Coordination normal.     Comments: 5/5 strength on the left lower extremity, 3/5 strength on the right; decreased sensation on the right lower extremity Equal bilateral grip strength and 5/5 strength to bilateral upper extremities      ED Treatments / Results  Labs (all labs ordered are listed, but only abnormal results are displayed) Labs Reviewed  COMPREHENSIVE METABOLIC PANEL - Abnormal; Notable for the following components:      Result Value   Glucose, Bld 130 (*)    BUN 27 (*)    Alkaline Phosphatase 35 (*)    All other components within normal limits  CBC WITH DIFFERENTIAL/PLATELET - Abnormal; Notable for the following components:   WBC 13.5 (*)    RBC 4.03 (*)    Hemoglobin 12.7 (*)    Neutro Abs 11.2 (*)    All other components within normal limits  RAPID URINE DRUG SCREEN, HOSP PERFORMED - Abnormal; Notable for the following components:   Benzodiazepines POSITIVE (*)    All other components within normal limits  URINALYSIS, ROUTINE W REFLEX MICROSCOPIC - Abnormal; Notable for the following components:   Specific Gravity, Urine >1.030 (*)    Hgb urine dipstick TRACE (*)    All other components within normal limits    URINALYSIS, MICROSCOPIC (REFLEX) - Abnormal; Notable for the following components:   Bacteria, UA RARE (*)    All other components within normal limits  CSF CELL COUNT WITH DIFFERENTIAL - Abnormal; Notable for the following components:   Color, CSF RED (*)    Appearance, CSF CLOUDY (*)    Segmented Neutrophils-CSF 61 (*)    Lymphs, CSF 25 (*)    Monocyte-Macrophage-Spinal Fluid 14 (*)  All other components within normal limits  CSF CELL COUNT WITH DIFFERENTIAL - Abnormal; Notable for the following components:   Color, CSF RED (*)    Appearance, CSF CLOUDY (*)    Segmented Neutrophils-CSF 63 (*)    Lymphs, CSF 34 (*)    Monocyte-Macrophage-Spinal Fluid 3 (*)    All other components within normal limits  PROTEIN AND GLUCOSE, CSF - Abnormal; Notable for the following components:   Total  Protein, CSF >600 (*)    All other components within normal limits  CK TOTAL AND CKMB (NOT AT [facility]) - Abnormal; Notable for the following components:   Total CK 623 (*)    All other components within normal limits  CBC - Abnormal; Notable for the following components:   RBC 4.03 (*)    MCV 108.4 (*)    All other components within normal limits  COMPREHENSIVE METABOLIC PANEL - Abnormal; Notable for the following components:   CO2 21 (*)    Glucose, Bld 111 (*)    BUN 25 (*)    Alkaline Phosphatase 29 (*)    All other components within normal limits  IRON AND TIBC - Abnormal; Notable for the following components:   Saturation Ratios 59 (*)    All other components within normal limits  SARS CORONAVIRUS 2 BY RT PCR (HOSPITAL ORDER, [place] [facility])  CSF CULTURE  LIPASE, BLOOD  HIV ANTIBODY (ROUTINE TESTING W REFLEX)  B. BURGDORFI ANTIBODIES  SEDIMENTATION RATE  C-REACTIVE PROTEIN  TSH  VITAMIN B12  FERRITIN  VITAMIN B1  VDRL, CSF  ANA  PROTEIN ELECTROPHORESIS, SERUM  UPEP/UIFE/LIGHT CHAINS/TP, 24-HR UR  FOLATE RBC  COMPREHENSIVE METABOLIC PANEL  CBC     EKG None  Radiology Dg Chest 2 View  Result Date: 11/06/2018 CLINICAL DATA:  Leg weakness, light smoker EXAM: CHEST - 2 VIEW COMPARISON:  None FINDINGS: Normal heart size, mediastinal contours, and pulmonary vascularity. Lungs clear. No pleural effusion or pneumothorax. Bones unremarkable. IMPRESSION: Normal exam.  A Electronically Signed   By: Lavonia Dana M.D.   On: 11/06/2018 18:18   Ct Head Wo Contrast  Result Date: 11/06/2018 CLINICAL DATA:  Lower extremity weakness EXAM: CT HEAD WITHOUT CONTRAST TECHNIQUE: Contiguous axial images were obtained from the base of the skull through the vertex without intravenous contrast. COMPARISON:  MRI 11/04/2018 FINDINGS: Brain: No acute territorial infarction, hemorrhage or intracranial mass. The ventricles are nonenlarged. Vascular: No hyperdense vessel or unexpected calcification. Skull: Normal. Negative for fracture or focal lesion. Sinuses/Orbits: No acute finding. Other: None IMPRESSION: Negative non contrasted CT appearance of the brain. Electronically Signed   By: Donavan Foil M.D.   On: 11/06/2018 17:53   Mr Jeri Cos TI Contrast  Result Date: 11/07/2018 CLINICAL DATA:  Bilateral lower extremity weakness. EXAM: MRI HEAD WITHOUT AND WITH CONTRAST TECHNIQUE: Multiplanar, multiecho pulse sequences of the brain and surrounding structures were obtained without and with intravenous contrast. CONTRAST:  29mL GADAVIST GADOBUTROL 1 MMOL/ML IV SOLN COMPARISON:  Head CT yesterday FINDINGS: Brain: The brain has a normal appearance without evidence of malformation, atrophy, old or acute small or large vessel infarction, mass lesion, hemorrhage, hydrocephalus or extra-axial collection. Vascular: Major vessels at the base of the brain show flow. Venous sinuses appear patent. Skull and upper cervical spine: Normal. Sinuses/Orbits: Mucosal thickening of the right maxillary sinus. Other sinuses clear. Other: Fluid in the posterior nasopharynx, reason not certain.  IMPRESSION: Normal brain MRI. No abnormality seen to explain the clinical presentation.  Electronically Signed   By: Paulina Fusi M.D.   On: 11/07/2018 17:40   Mr Thoracic Spine W Wo Contrast  Result Date: 11/07/2018 CLINICAL DATA:  Numbness, tingling and paresthesia of the extremities. EXAM: MRI THORACIC WITHOUT AND WITH CONTRAST TECHNIQUE: Multiplanar and multiecho pulse sequences of the thoracic spine were obtained without and with intravenous contrast. CONTRAST:  96mL GADAVIST GADOBUTROL 1 MMOL/ML IV SOLN COMPARISON:  None. FINDINGS: MRI THORACIC SPINE FINDINGS Alignment:  Normal Vertebrae: Normal Cord:  No primary cord lesion.  See below regarding stenosis. Paraspinal and other soft tissues: Negative Disc levels: No abnormality from T1-2 through T4-5. T5-6: Minor left-sided disc bulge.  No compressive stenosis. T6-7: Normal. T7-8: Central to slightly right-sided disc herniation, narrowing the ventral subarachnoid space but not compressing the cord. No compressive foraminal narrowing. T8-9: Normal interspace. T9-10: Minor left-sided disc bulge.  No compressive stenosis. T10-11: Right paracentral disc bulge.  No compressive stenosis. T11-12 and T12-L1: Normal. IMPRESSION: Degenerative changes as outlined above. The only potentially symptomatic finding is at T7-8 where there is a central to right-sided disc herniation. This narrows the ventral subarachnoid space and contacts the ventral cord, but ample subarachnoid spaces present dorsal to the cord in there does not appear to be any cord compression or abnormal cord signal. None the less, this finding could be symptomatic. Electronically Signed   By: Paulina Fusi M.D.   On: 11/07/2018 17:45   Mr Lumbar Spine W Wo Contrast  Result Date: 11/07/2018 CLINICAL DATA:  Lower extremity numbness. EXAM: MRI LUMBAR SPINE WITHOUT AND WITH CONTRAST TECHNIQUE: Multiplanar and multiecho pulse sequences of the lumbar spine were obtained without and with intravenous  contrast. CONTRAST:  51mL GADAVIST GADOBUTROL 1 MMOL/ML IV SOLN COMPARISON:  11/04/2018 FINDINGS: Segmentation:  5 lumbar type vertebral bodies. Alignment:  Straightening of the normal lumbar lordosis. Vertebrae:  No fracture or primary bone lesion. Conus medullaris and cauda equina: Conus extends to the T12-L1 level. Conus and cauda equina appear normal. Paraspinal and other soft tissues: Distended bladder Disc levels: L1-2: Circumferential bulging of the disc. Mild stenosis without visible neural compression. L2-3: Broad-based disc herniation with caudal migration behind upper L3. Severe spinal stenosis with effacement of the subarachnoid space and likely nerve compression. L3-4: Bulging of the disc. Mild stenosis of both lateral recesses and foramina without definite neural compression. L4-5: Bulging of the disc more towards the left. Mild stenosis of both lateral recesses. Left foraminal narrowing with some potential for neural compression. L5-S1: Disc bulge. Mild facet hypertrophy. No compressive stenosis. 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 This is quite likely the significant lesion in this case. Disc bulges at L1-2, L3-4 and L4-5 as noted above with lateral recess and foraminal stenoses but no definite neural compression. Electronically Signed   By: Paulina Fusi M.D.   On: 11/07/2018 17:51    Procedures Procedures (including critical care time)  Medications Ordered in ED Medications  HYDROmorphone (DILAUDID) injection 1 mg ( Intravenous MAR Unhold 11/07/18 1828)  0.9 %  sodium chloride infusion (has no administration in time range)  acetaminophen (TYLENOL) tablet 650 mg ( Oral MAR Unhold 11/07/18 1828)    Or  acetaminophen (TYLENOL) suppository 650 mg ( Rectal MAR Unhold 11/07/18 1828)  diazepam (VALIUM) tablet 5 mg ( Oral MAR Unhold 11/07/18 1828)   oxyCODONE-acetaminophen (PERCOCET/ROXICET) 5-325 MG per tablet 1 tablet (1 tablet Oral Given 11/07/18 1933)  meloxicam (MOBIC) tablet 15 mg ( Oral MAR Unhold 11/07/18 1828)  lactated ringers infusion (has no administration in time range)  HYDROmorphone (DILAUDID) injection 1 mg (has no administration in time range)  morphine 4 MG/ML injection 4 mg (4 mg Intravenous Given 11/06/18 2108)  HYDROmorphone (DILAUDID) injection 1 mg (1 mg Intravenous Given 11/06/18 2147)  HYDROmorphone (DILAUDID) injection 1 mg (1 mg Intravenous Given 11/06/18 2345)  LORazepam (ATIVAN) injection 1 mg (1 mg Intravenous Given 11/06/18 2343)  haloperidol lactate (HALDOL) injection 2 mg (2 mg Intravenous Given 11/06/18 2300)  ketorolac (TORADOL) 30 MG/ML injection 15 mg (15 mg Intravenous Given 11/06/18 2300)  HYDROmorphone (DILAUDID) injection 1 mg (1 mg Intravenous Given 11/07/18 0431)  lidocaine (PF) (XYLOCAINE) 1 % injection 5 mL (5 mLs Other Given by Other 11/07/18 0308)  gadobutrol (GADAVIST) 1 MMOL/ML injection 10 mL (10 mLs Intravenous Contrast Given 11/07/18 1718)  fentaNYL (SUBLIMAZE) 100 MCG/2ML injection (  Duplicate 11/07/18 1735)     Initial Impression / Assessment and Plan / ED Course  I have reviewed the triage vital signs and the nursing notes.  Pertinent labs & imaging results that were available during my care of the patient were reviewed by me and considered in my medical decision making (see chart for details).  Clinical Course as of Nov 06 2117  Mon Nov 06, 2018  1648 Spoke with Dr. Otelia LimesLindzen.  He states patient needs a thoracic spine MRI with and without contrast.  He does not need a repeat lumbar MRI.  No indication for C-spine or head imaging at this time.He states patient does not need a lumbar puncture in addition.   [EH]  1711 [facility].  He accepts the patient in transfer.   [EH]    Clinical Course User Index [EH] Cristina GongHammond, Elizabeth W, PA-C        Patient [facility] for MRI of his thoracic and lumbar spine as well as lumbar puncture for progressive leg weakness and back pain.  Patient unable to tolerate MRI despite initial pain control.  Attempt to control and antianxiety with Ativan, Haldol, Dilaudid, per recommendation by Dr. Juleen ChinaKohut.  At shift change, care transferred to Dr. Elesa MassedWard for follow-up of MRI and possibly LP if MRI findings are not diagnostic.  Patient also guided by my attending, Dr. Juleen ChinaKohut, management.  Final Clinical Impressions(s) / ED Diagnoses   Final diagnoses:  Bilateral leg weakness  Neurogenic claudication due to lumbar spinal stenosis    ED Discharge Orders    None       Verdis PrimeLaw, Iyauna Sing M, PA-C 11/07/18 2119    Raeford RazorKohut, Stephen, MD 11/08/18 1521

## 2018-11-06 NOTE — ED Notes (Signed)
Pt coming from [place] after being seen there for spinal stenosis and sciatica. Pt having right sided weakness and sensation in BLE (especially the right side). No issues with bowel or bladder. All vitals WDL with EMS. Pt is A&O x4.

## 2018-11-06 NOTE — Progress Notes (Signed)
Pt cannot lay flat on MR scan table for any amount of time. Pt given multiple pain meds. RN aware.

## 2018-11-06 NOTE — ED Notes (Signed)
Was called into MRI to give pt pain meds

## 2018-11-06 NOTE — ED Provider Notes (Addendum)
11:30 PM  Assumed care from Dr. Juleen China and Glenford Bayley PA.  Patient here with back pain with bilateral lower extremity numbness and weakness.  Plan is for MRI with and without contrast of the T and L-spine.  Lumbar puncture will need to be performed after MRI.   Neuro has been consulted.  1:25 AM  Pt unable to lie flat on his back.  Given multiple medications to help with pain and to sedate patient appropriately before MRI.  Discussed this with neurology, Dr. Wilford Corner.  He recommends going ahead and performing the lumbar puncture to rule out Guillain-Barr.  If cell count and protein are normal, will admit to medicine for PT/OT and nonemergent neurosurgical evaluation as well as possible MRIs under anesthesia.  3:25 AM  LP performed.  Was a traumatic tap.  Patient tolerated well.  Discussed with Dr. Wilford Corner with neurology who will follow up on CSF results.  Would like these results back prior to admission.  Patient is able to lie flat now.  We will try to send him back for MRI of the T and L-spine with and without contrast.  Neuro recommended non-emergent NSG evaluation as well.  3:30 AM  Discussed with Kathryne Eriksson, PA on-call for neurosurgery.  He will see patient in consultation.  4:55 AM  D/w Dr. Wilford Corner.  CSF studies still pending.  Gram stain is negative.  Lab working on obtaining protein, glucose and cell count.  This seems more likely to be new neurogenic claudication due to his spinal stenosis.  Will admit to medicine for pain control, PT/OT and further neurosurgical evaluation.  Neurology to follow up on CSF results.  5:14 AM Discussed patient's case with hospitalist, Dr. Selena Batten.  I have recommended admission and patient (and family if present) agree with this plan. Admitting physician will place admission orders.   I reviewed all nursing notes, vitals, pertinent previous records, EKGs, lab and urine results, imaging (as available).    CRITICAL CARE Performed by: Rochele Raring   Total critical care  time: 45 minutes  Critical care time was exclusive of separately billable procedures and treating other patients.  Critical care was necessary to treat or prevent imminent or life-threatening deterioration.  Critical care was time spent personally by me on the following activities: development of treatment plan with patient and/or surrogate as well as nursing, discussions with consultants, evaluation of patient's response to treatment, examination of patient, obtaining history from patient or surrogate, ordering and performing treatments and interventions, ordering and review of laboratory studies, ordering and review of radiographic studies, pulse oximetry and re-evaluation of patient's condition.   .Lumbar Puncture  Date/Time: 11/07/2018 3:00 AM Performed by: Ward, Layla Maw, DO Authorized by: Ward, Layla Maw, DO   Consent:    Consent obtained:  Written   Consent given by:  Patient   Risks discussed:  Bleeding, headache, infection, pain, repeat procedure and nerve damage Pre-procedure details:    Procedure purpose:  Diagnostic   Preparation: Patient was prepped and draped in usual sterile fashion   Anesthesia (see MAR for exact dosages):    Anesthesia method:  Local infiltration   Local anesthetic:  Lidocaine 1% w/o epi Procedure details:    Lumbar space:  L3-L4 interspace   Patient position:  Sitting   Needle gauge:  18   Needle type:  Spinal needle - Quincke tip   Needle length (in):  3.5   Ultrasound guidance: no     Number of attempts:  2   Fluid  appearance:  Blood-tinged   Tubes of fluid:  4   Total volume (ml):  6 Post-procedure:    Puncture site:  Adhesive bandage applied and direct pressure applied   Patient tolerance of procedure:  Tolerated well, no immediate complications      Ward, Delice Bison, DO 11/07/18 0514    Ward, Delice Bison, DO 11/07/18 1006

## 2018-11-06 NOTE — ED Triage Notes (Signed)
Lower back pain. No injury. States he is visiting and wants to see if he can get a steroid injection.

## 2018-11-06 NOTE — ED Provider Notes (Signed)
Transfer from [facility] for MRI and then LP. Unfortunately, could not tolerate MRI initially. Will further medicate and try again. Discussed LP procedure with patient with his sister present. No additional questions currently.    Virgel Manifold, MD 11/06/18 2226

## 2018-11-06 NOTE — ED Notes (Signed)
Was called back to MRI to give meds due to pt being unable to lay on MRI table because of back pain. PA advised and med order was placed

## 2018-11-06 NOTE — ED Notes (Signed)
Pt. Did go for acupuncture today with no changes and decided to come to ED for relief to drive back home to [place]

## 2018-11-07 ENCOUNTER — Observation Stay (HOSPITAL_COMMUNITY): Payer: Self-pay

## 2018-11-07 ENCOUNTER — Other Ambulatory Visit (HOSPITAL_COMMUNITY): Payer: Self-pay

## 2018-11-07 ENCOUNTER — Observation Stay (HOSPITAL_COMMUNITY): Payer: Self-pay | Admitting: Certified Registered"

## 2018-11-07 ENCOUNTER — Encounter (HOSPITAL_COMMUNITY): Payer: Self-pay | Admitting: Internal Medicine

## 2018-11-07 ENCOUNTER — Encounter (HOSPITAL_COMMUNITY)
Admission: EM | Disposition: A | Discharge status: Rehabilitation Facility | Payer: Self-pay | Source: Home / Self Care | Attending: Internal Medicine

## 2018-11-07 DIAGNOSIS — R29898 Other symptoms and signs involving the musculoskeletal system: Secondary | ICD-10-CM | POA: Diagnosis present

## 2018-11-07 DIAGNOSIS — R200 Anesthesia of skin: Secondary | ICD-10-CM | POA: Diagnosis present

## 2018-11-07 DIAGNOSIS — R202 Paresthesia of skin: Secondary | ICD-10-CM

## 2018-11-07 HISTORY — PX: RADIOLOGY WITH ANESTHESIA: SHX6223

## 2018-11-07 LAB — COMPREHENSIVE METABOLIC PANEL
ALT: 19 U/L (ref 0–44)
AST: 21 U/L (ref 15–41)
Albumin: 3.8 g/dL (ref 3.5–5.0)
Alkaline Phosphatase: 29 U/L — ABNORMAL LOW (ref 38–126)
Anion gap: 13 (ref 5–15)
BUN: 25 mg/dL — ABNORMAL HIGH (ref 6–20)
CO2: 21 mmol/L — ABNORMAL LOW (ref 22–32)
Calcium: 9 mg/dL (ref 8.9–10.3)
Chloride: 109 mmol/L (ref 98–111)
Creatinine, Ser: 1.11 mg/dL (ref 0.61–1.24)
GFR calc Af Amer: >60 mL/min (ref >60)
GFR calc non Af Amer: >60 mL/min (ref >60)
Glucose, Bld: 111 mg/dL — ABNORMAL HIGH (ref 70–99)
Potassium: 3.9 mmol/L (ref 3.5–5.1)
Sodium: 143 mmol/L (ref 135–145)
Total Bilirubin: UNDETERMINED mg/dL (ref 0.3–1.2)
Total Protein: 6.7 g/dL (ref 6.5–8.1)

## 2018-11-07 LAB — CK TOTAL AND CKMB (NOT AT ARMC)
CK, MB: 3.6 ng/mL (ref 0.5–5.0)
Relative Index: 0.6 (ref 0.0–2.5)
Total CK: 623 U/L — ABNORMAL HIGH (ref 49–397)

## 2018-11-07 LAB — CSF CELL COUNT WITH DIFFERENTIAL
Eosinophils, CSF: 0 % (ref 0–1)
Eosinophils, CSF: 0 % (ref 0–1)
Lymphs, CSF: 25 % — ABNORMAL LOW (ref 40–80)
Lymphs, CSF: 34 % — ABNORMAL LOW (ref 40–80)
Monocyte-Macrophage-Spinal Fluid: 14 % — ABNORMAL LOW (ref 15–45)
Monocyte-Macrophage-Spinal Fluid: 3 % — ABNORMAL LOW (ref 15–45)
RBC Count, CSF: UNDETERMINED /mm3
RBC Count, CSF: UNDETERMINED /mm3
Segmented Neutrophils-CSF: 61 % — ABNORMAL HIGH (ref 0–6)
Segmented Neutrophils-CSF: 63 % — ABNORMAL HIGH (ref 0–6)
Tube #: 1
Tube #: 4
WBC, CSF: UNDETERMINED /mm3 (ref 0–5)
WBC, CSF: UNDETERMINED /mm3 (ref 0–5)

## 2018-11-07 LAB — FERRITIN: Ferritin: 258 ng/mL (ref 24–336)

## 2018-11-07 LAB — CBC
HCT: 43.7 % (ref 39.0–52.0)
Hemoglobin: 13.4 g/dL (ref 13.0–17.0)
MCH: 33.3 pg (ref 26.0–34.0)
MCHC: 30.7 g/dL (ref 30.0–36.0)
MCV: 108.4 fL — ABNORMAL HIGH (ref 80.0–100.0)
Platelets: 236 10*3/uL (ref 150–400)
RBC: 4.03 MIL/uL — ABNORMAL LOW (ref 4.22–5.81)
RDW: 12.7 % (ref 11.5–15.5)
WBC: 9.2 10*3/uL (ref 4.0–10.5)
nRBC: 0 % (ref 0.0–0.2)

## 2018-11-07 LAB — IRON AND TIBC
Iron: 179 ug/dL (ref 45–182)
Saturation Ratios: 59 % — ABNORMAL HIGH (ref 17.9–39.5)
TIBC: 304 ug/dL (ref 250–450)
UIBC: 125 ug/dL

## 2018-11-07 LAB — TSH: TSH: 1.577 u[IU]/mL (ref 0.350–4.500)

## 2018-11-07 LAB — PROTEIN AND GLUCOSE, CSF
Glucose, CSF: 66 mg/dL (ref 40–70)
Total  Protein, CSF: >600 mg/dL — ABNORMAL HIGH (ref 15–45)

## 2018-11-07 LAB — B. BURGDORFI ANTIBODIES: B burgdorferi Ab IgG+IgM: <0.91 {ISR} (ref 0.00–0.90)

## 2018-11-07 LAB — VITAMIN B12: Vitamin B-12: 270 pg/mL (ref 180–914)

## 2018-11-07 IMAGING — MR MR HEAD WO/W CM
9 of 12 series · 34 of 48 positions shown · IV contrast (gadavist)
Comparison: Head CT yesterday

CLINICAL DATA: Bilateral lower extremity weakness.

EXAM:
MRI HEAD WITHOUT AND WITH CONTRAST
TECHNIQUE: Multiplanar, multiecho pulse sequences of the brain and surrounding
structures were obtained without and with intravenous contrast.
CONTRAST:  10mL GADAVIST GADOBUTROL 1 MMOL/ML IV SOLN

[Series 3: DWI · axial · 3.0mm · 1.09mm/px · z∈[-72,+88]mm · 8 of 110 slices shown (1 of 4)]
[im 1/110]
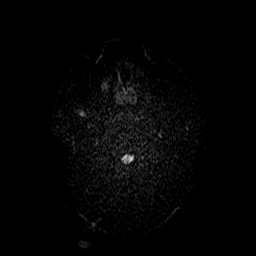
[im 13/110]
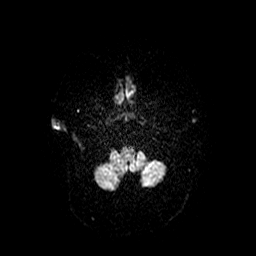
[im 37/110]
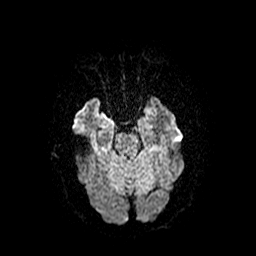
[im 49/110]
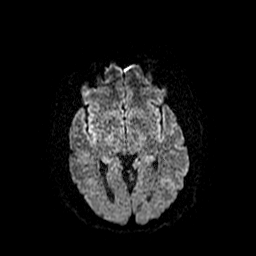
[im 61/110]
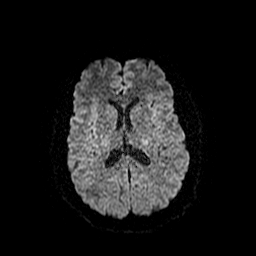
[im 73/110]
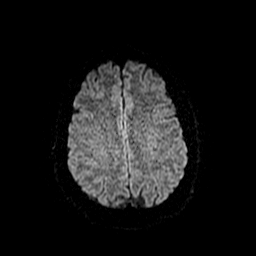
[im 97/110]
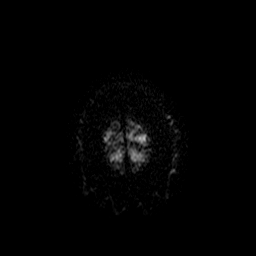
[im 110/110]
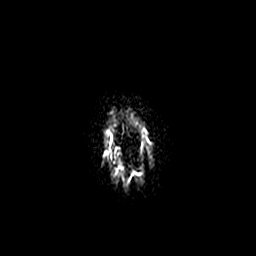

[Series 4: FLAIR · axial · 3.0mm · 0.45mm/px · z∈[-67,+87]mm · 3 of 27 slices shown]
[im 1/27]
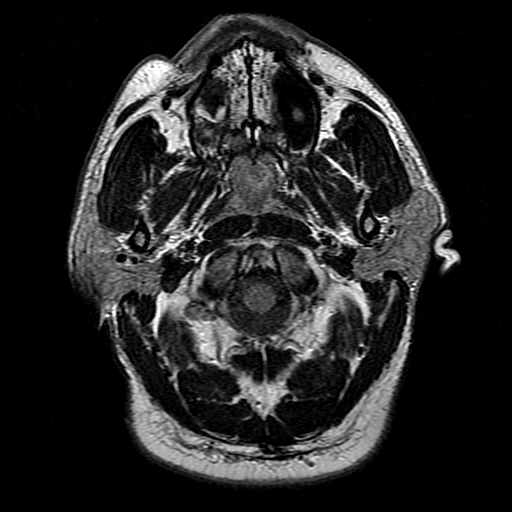
[im 14/27]
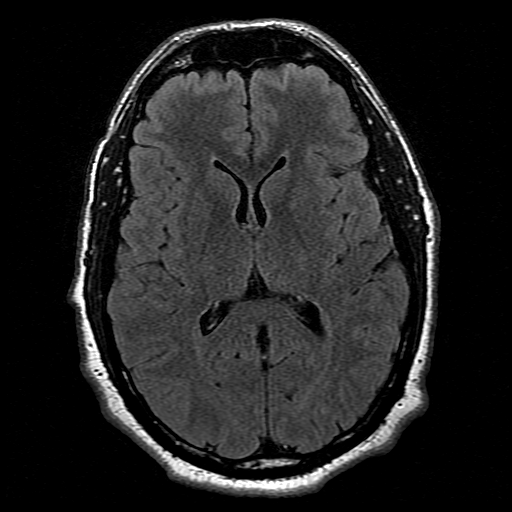
[im 27/27]
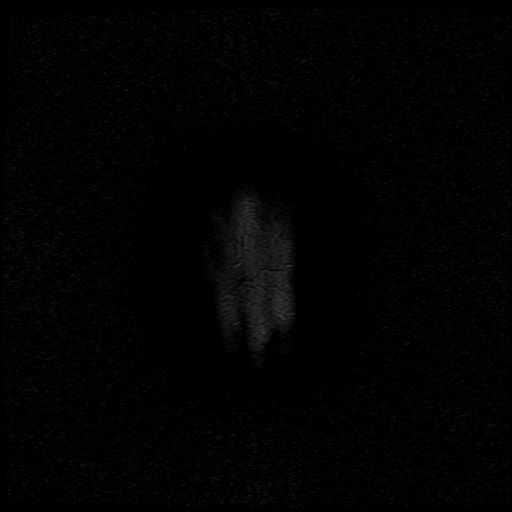

[Series 5: DWI · coronal · 5.0mm · 1.09mm/px · 6 of 78 slices shown (2 of 4)]
[im 1/78]
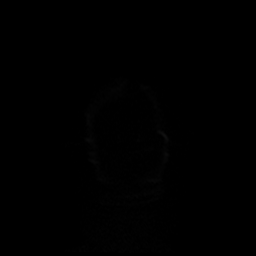
[im 16/78]
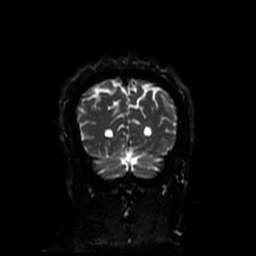
[im 31/78]
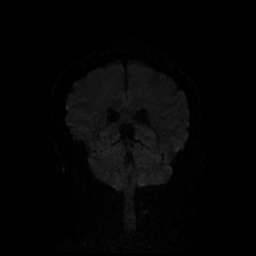
[im 47/78]
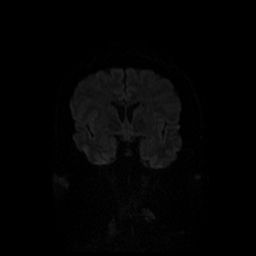
[im 62/78]
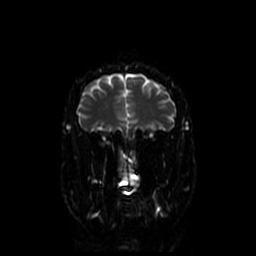
[im 78/78]
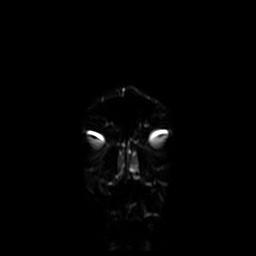

[Series 8: T2 · axial · 5.0mm · 0.43mm/px · z∈[-67,+87]mm · 2 of 27 slices shown]
[im 1/27]
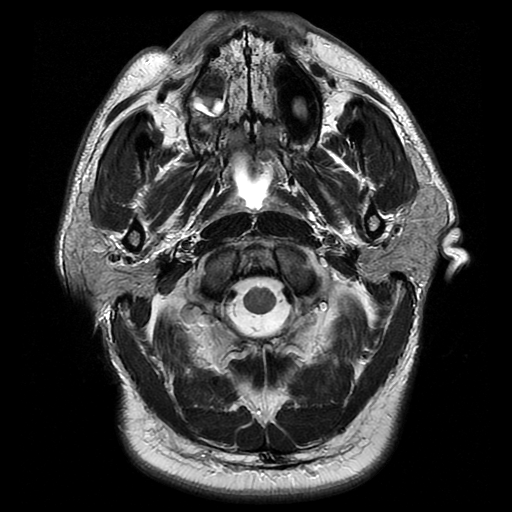
[im 27/27]
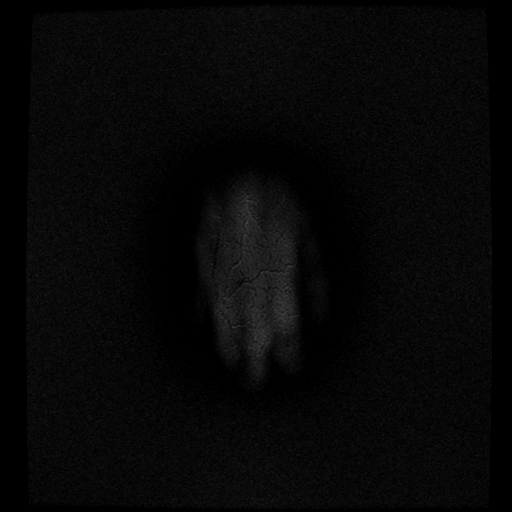

[Series 33: T2 post-contrast · coronal · 5.0mm · 0.39mm/px · 2 of 30 slices shown]
[im 1/30]
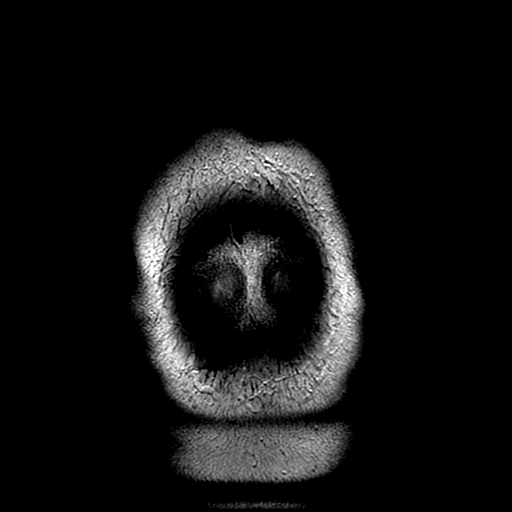
[im 30/30]
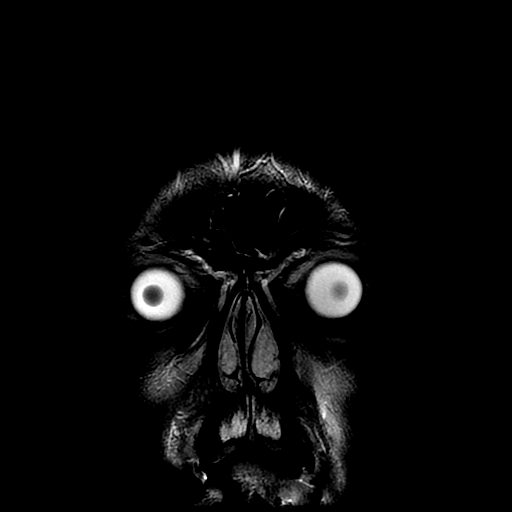

[Series 34: T1 post-contrast · axial · 3.0mm · 0.47mm/px · z∈[-68,+84]mm · 4 of 52 slices shown (1 of 2)]
[im 1/52]
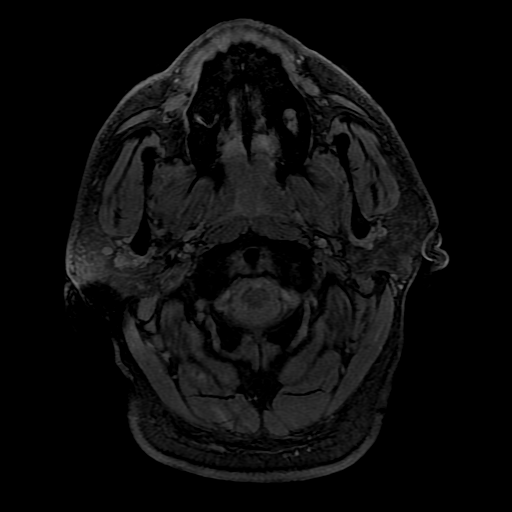
[im 18/52]
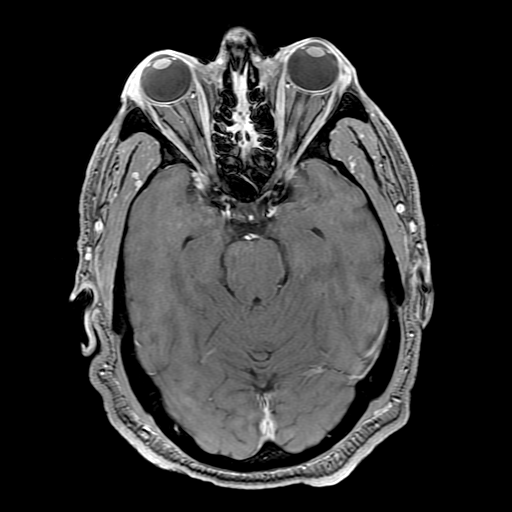
[im 35/52]
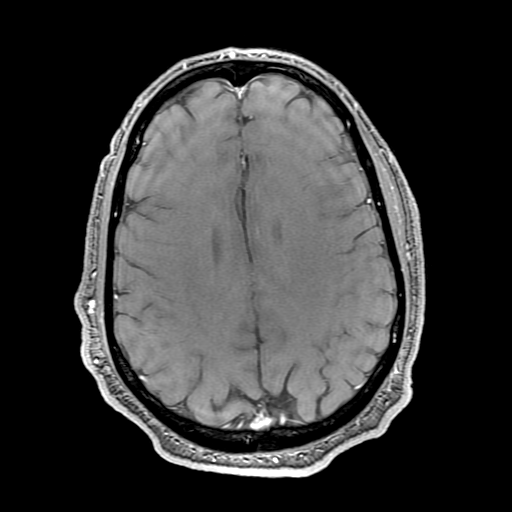
[im 52/52]
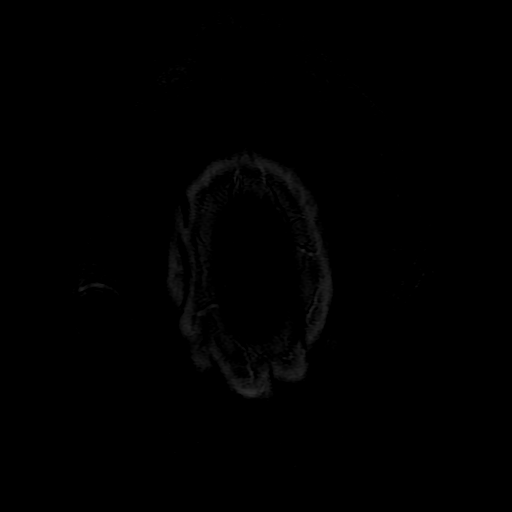

[Series 35: T1 post-contrast · coronal · 5.0mm · 0.39mm/px · 2 of 30 slices shown (2 of 2)]
[im 1/30]
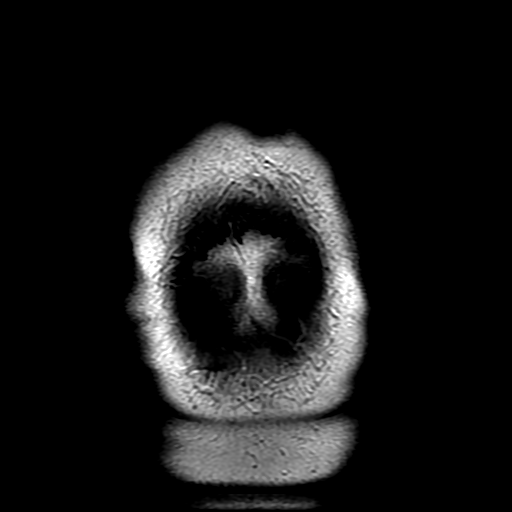
[im 30/30]
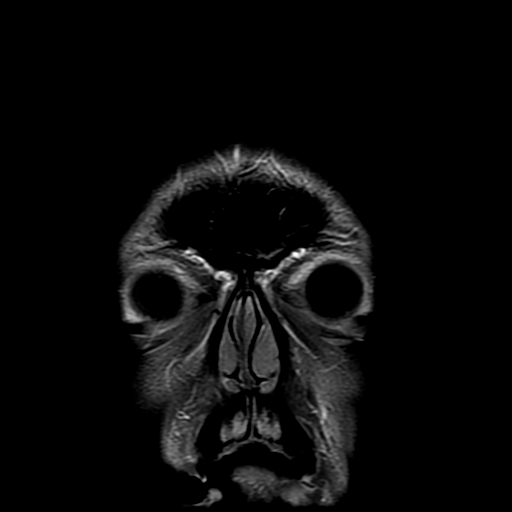

[Series 300: DWI · axial · 3.0mm · 1.09mm/px · z∈[-72,+88]mm · 4 of 55 slices shown (3 of 4)]
[im 1/55]
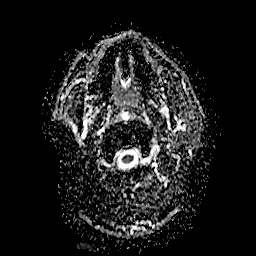
[im 19/55]
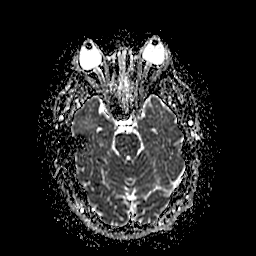
[im 37/55]
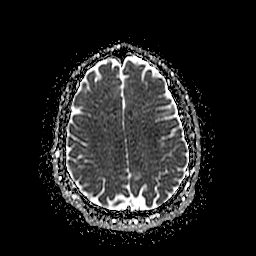
[im 55/55]
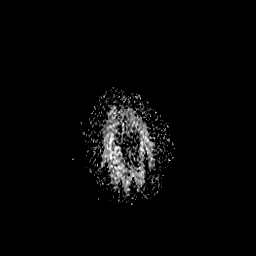

[Series 500: DWI · coronal · 5.0mm · 1.09mm/px · 3 of 39 slices shown (4 of 4)]
[im 1/39]
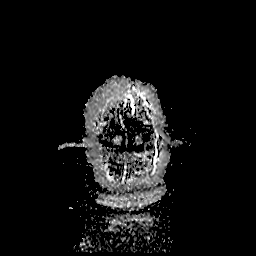
[im 20/39]
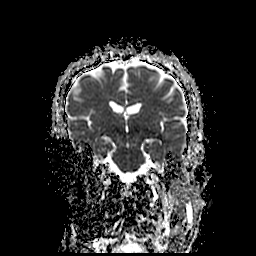
[im 39/39]
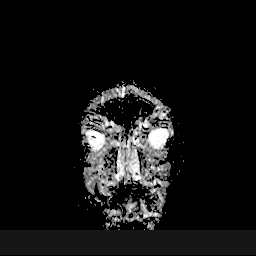

[34 of 48 positions shown; findings below may reference images not displayed]

FINDINGS: Brain: The brain has a normal appearance without evidence of
malformation, atrophy, old or acute small or large vessel
infarction, mass lesion, hemorrhage, hydrocephalus or extra-axial
collection.

Vascular: Major vessels at the base of the brain show flow. Venous
sinuses appear patent.

Skull and upper cervical spine: Normal.

Sinuses/Orbits: Mucosal thickening of the right maxillary sinus.
Other sinuses clear.

Other: Fluid in the posterior nasopharynx, reason not certain.
IMPRESSION: Normal brain MRI. No abnormality seen to explain the clinical
presentation.

## 2018-11-07 IMAGING — MR MR THORACIC SPINE WO/W CM
6 of 16 series · 19 of 48 positions shown · IV contrast (20    MULTI)
Comparison: None.

CLINICAL DATA: Numbness, tingling and paresthesia of the
extremities.

EXAM:
MRI THORACIC WITHOUT AND WITH CONTRAST
TECHNIQUE: Multiplanar and multiecho pulse sequences of the thoracic spine were
obtained without and with intravenous contrast.
CONTRAST:  10mL GADAVIST GADOBUTROL 1 MMOL/ML IV SOLN

[Series 16: T2 · sagittal · 3.0mm · 0.62mm/px · 2 of 18 slices shown (1 of 4)]
[im 1/18]
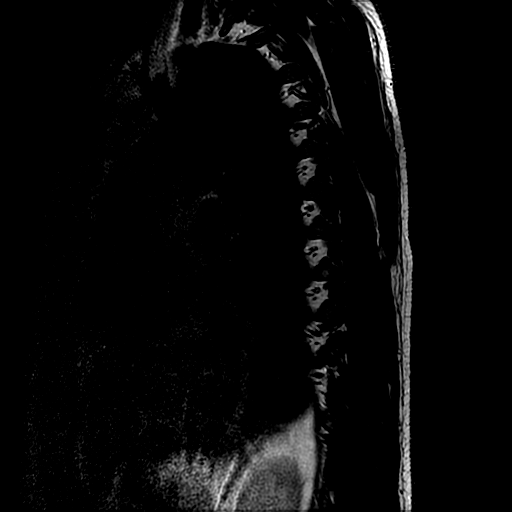
[im 18/18]
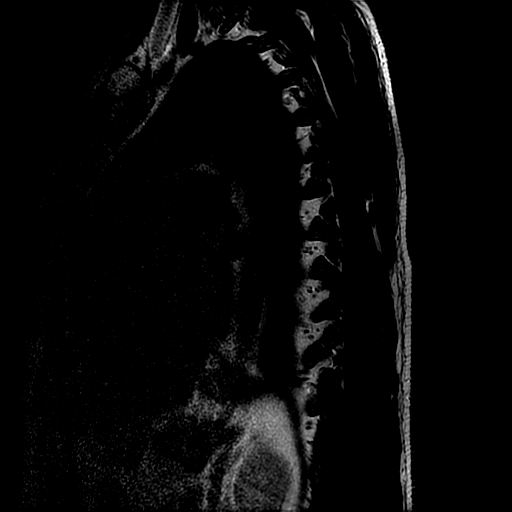

[Series 17: T1 · sagittal · 3.0mm · 0.62mm/px · 2 of 18 slices shown (1 of 2)]
[im 1/18]
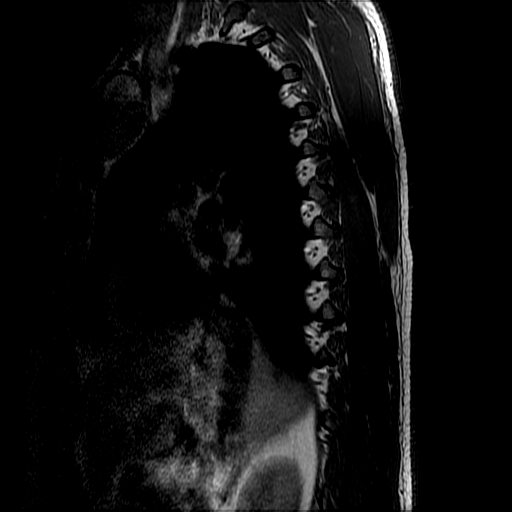
[im 18/18]
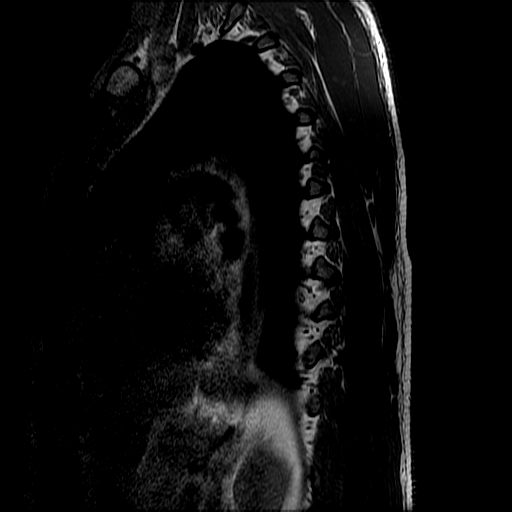

[Series 18: T2 · axial · 4.0mm · 0.43mm/px · z∈[-497,-261]mm · 4 of 36 slices shown (2 of 4)]
[im 1/36]
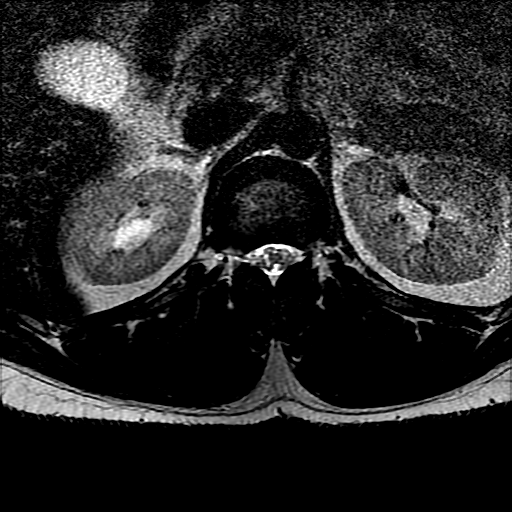
[im 12/36]
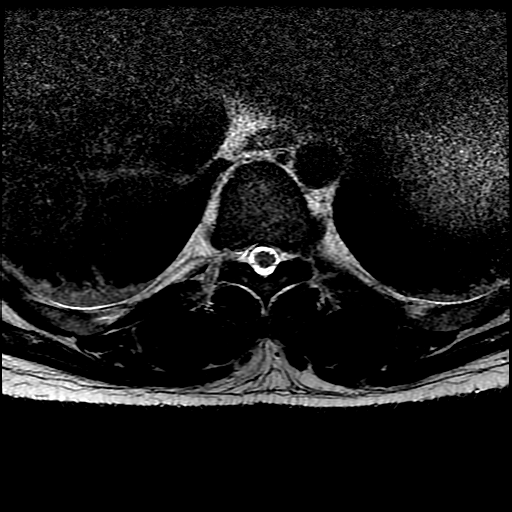
[im 24/36]
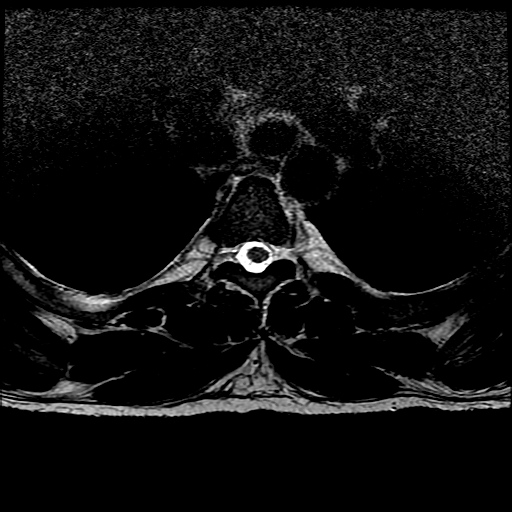
[im 36/36]
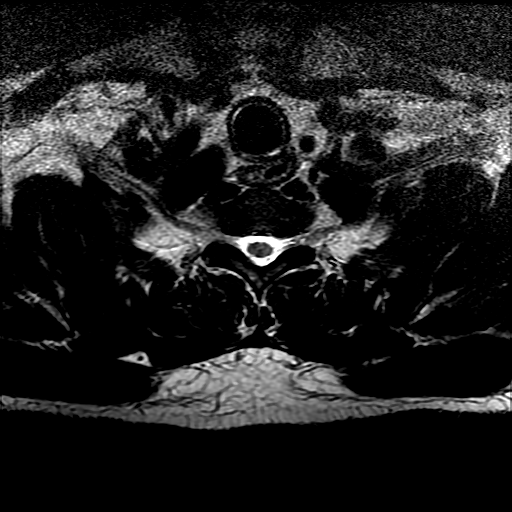

[Series 20: T1 · axial · non-contrast · 4.0mm · 0.43mm/px · z∈[-497,-261]mm · 4 of 36 slices shown (2 of 2)]
[im 1/36]
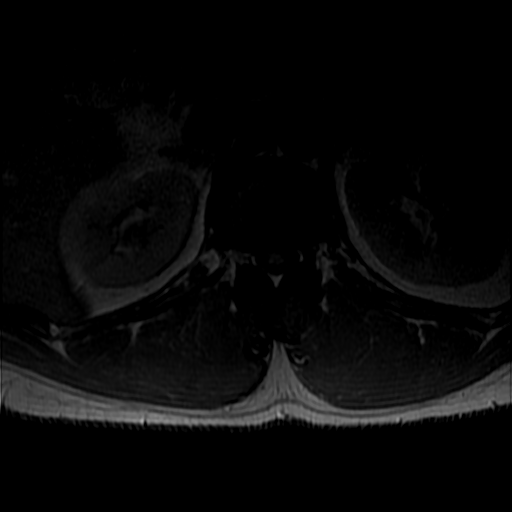
[im 12/36]
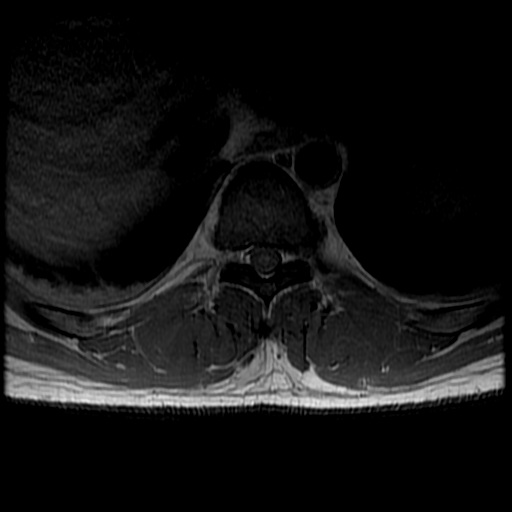
[im 24/36]
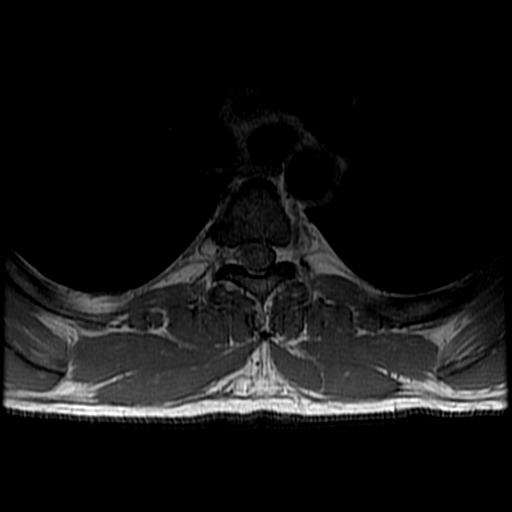
[im 36/36]
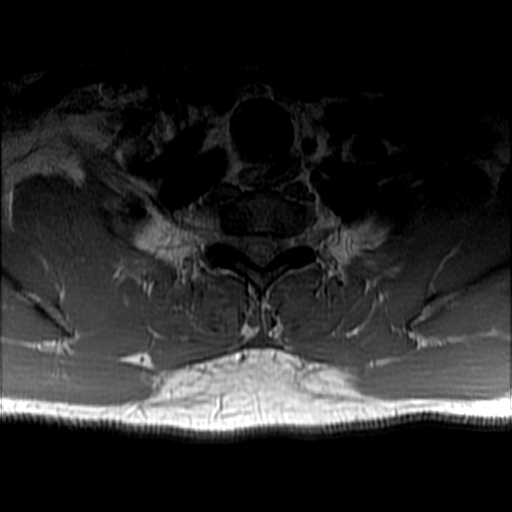

[Series 24: T2 · sagittal · 4.0mm · 0.55mm/px · 2 of 14 slices shown (3 of 4)]
[im 1/14]
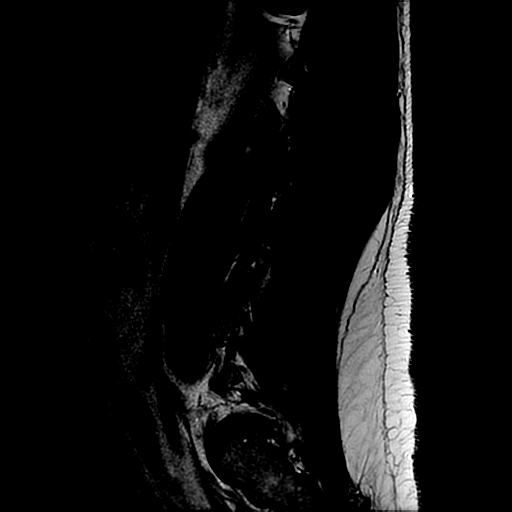
[im 14/14]
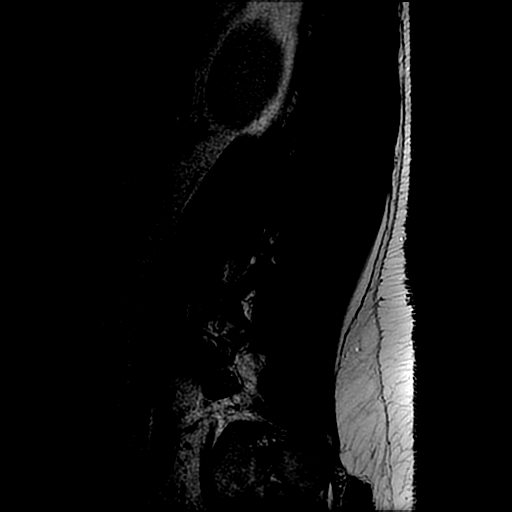

[Series 27: T2 · axial · 4.0mm · 0.39mm/px · z∈[-727,-520]mm · 5 of 40 slices shown (4 of 4)]
[im 1/40]
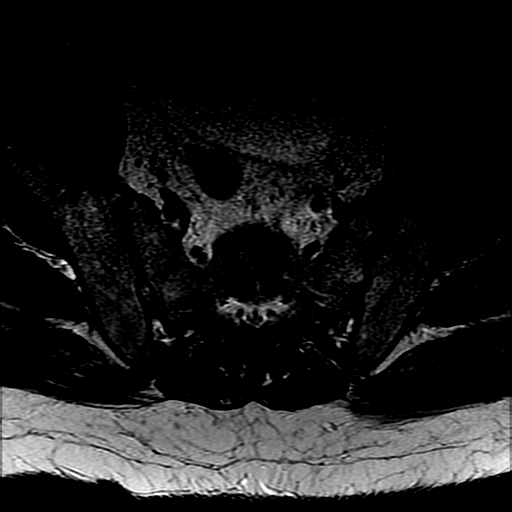
[im 10/40]
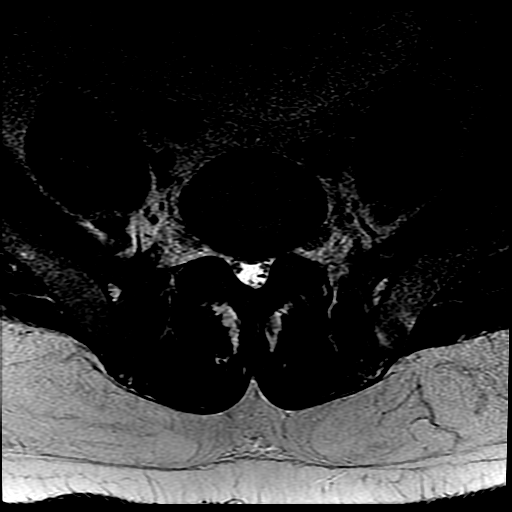
[im 20/40]
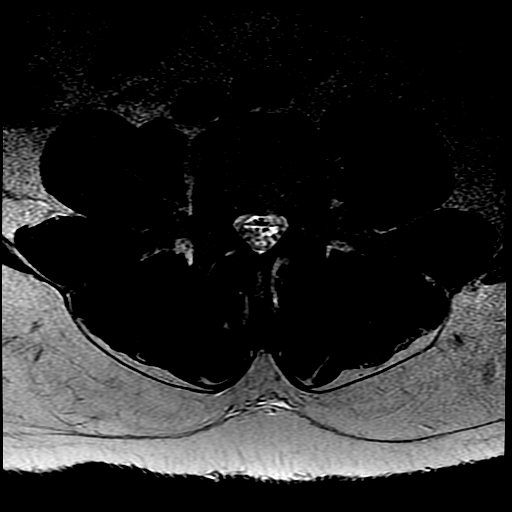
[im 30/40]
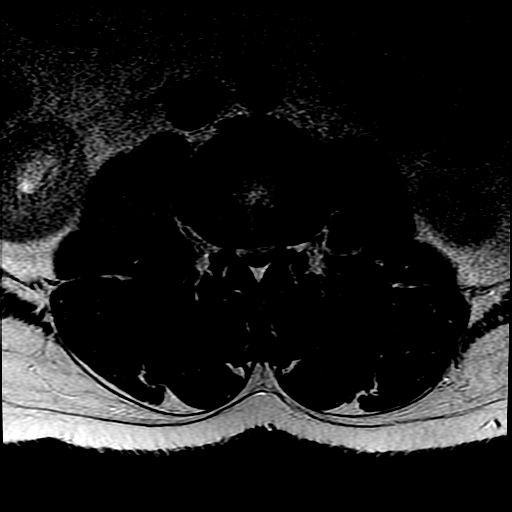
[im 40/40]
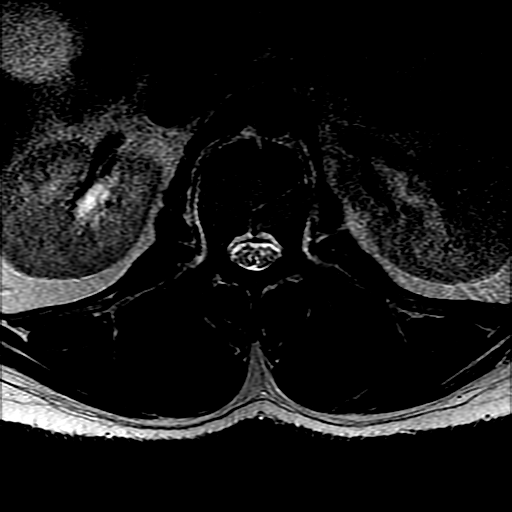

[19 of 48 positions shown; findings below may reference images not displayed]

FINDINGS: MRI THORACIC SPINE FINDINGS

Alignment:  Normal

Vertebrae: Normal

Cord:  No primary cord lesion.  See below regarding stenosis.

Paraspinal and other soft tissues: Negative

Disc levels:

No abnormality from T1-2 through T4-5.

T5-6: Minor left-sided disc bulge.  No compressive stenosis.

T6-7: Normal.

T7-8: Central to slightly right-sided disc herniation, narrowing the
ventral subarachnoid space but not compressing the cord. No
compressive foraminal narrowing.

T8-9: Normal interspace.

T9-10: Minor left-sided disc bulge.  No compressive stenosis.

T10-11: Right paracentral disc bulge.  No compressive stenosis.

T11-12 and T12-L1: Normal.
IMPRESSION: Degenerative changes as outlined above. The only potentially
symptomatic finding is at T7-8 where there is a central to
right-sided disc herniation. This narrows the ventral subarachnoid
space and contacts the ventral cord, but ample subarachnoid spaces
present dorsal to the cord in there does not appear to be any cord
compression or abnormal cord signal. None the less, this finding
could be symptomatic.

## 2018-11-07 IMAGING — MR MR LUMBAR SPINE WO/W CM
6 of 16 series · 19 of 48 positions shown · IV contrast (20    MULTI)
Comparison: [DATE]

CLINICAL DATA: Lower extremity numbness.

EXAM:
MRI LUMBAR SPINE WITHOUT AND WITH CONTRAST
TECHNIQUE: Multiplanar and multiecho pulse sequences of the lumbar spine were
obtained without and with intravenous contrast.
CONTRAST:  10mL GADAVIST GADOBUTROL 1 MMOL/ML IV SOLN

[Series 16: T2 · sagittal · 3.0mm · 0.62mm/px · 2 of 18 slices shown (1 of 4)]
[im 1/18]
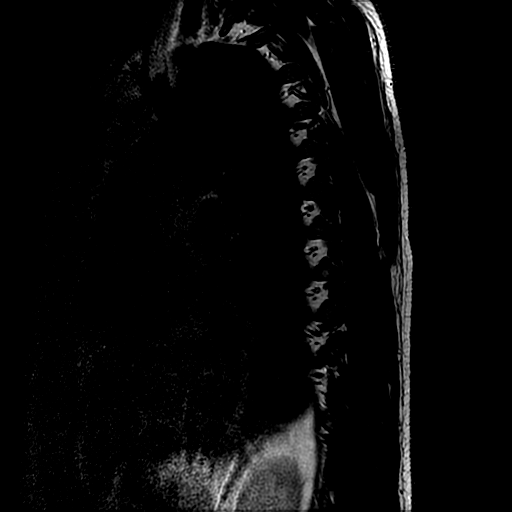
[im 18/18]
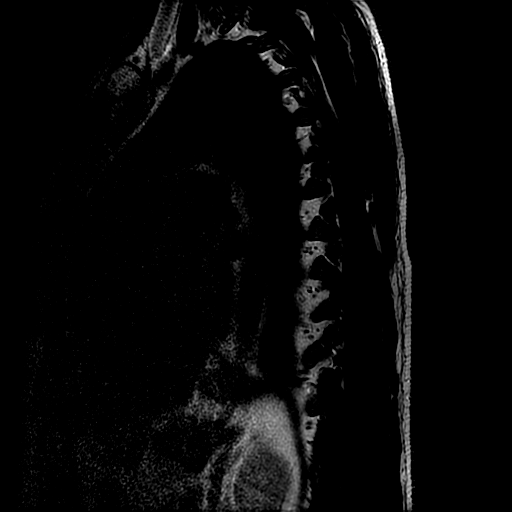

[Series 17: T1 · sagittal · 3.0mm · 0.62mm/px · 2 of 18 slices shown (1 of 2)]
[im 1/18]
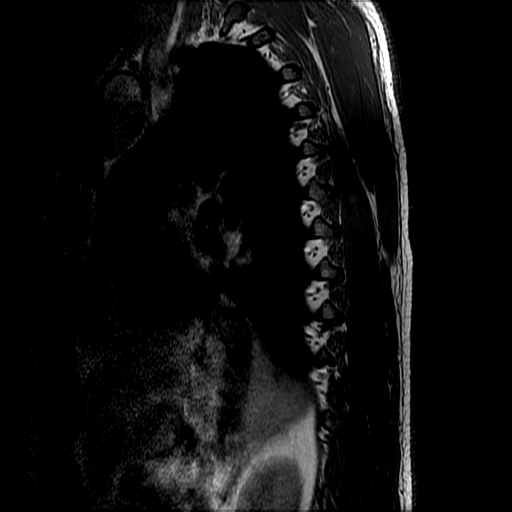
[im 18/18]
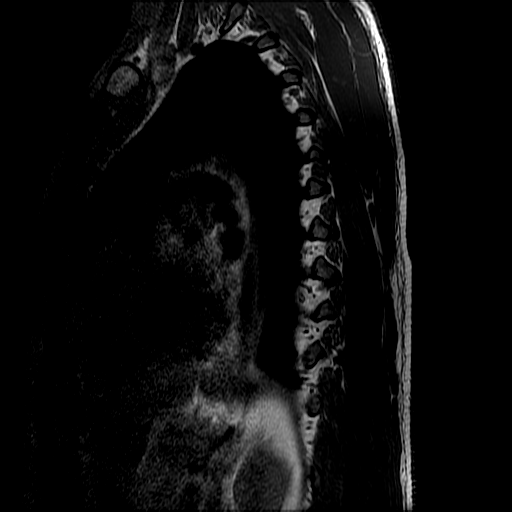

[Series 18: T2 · axial · 4.0mm · 0.43mm/px · z∈[-497,-261]mm · 4 of 36 slices shown (2 of 4)]
[im 1/36]
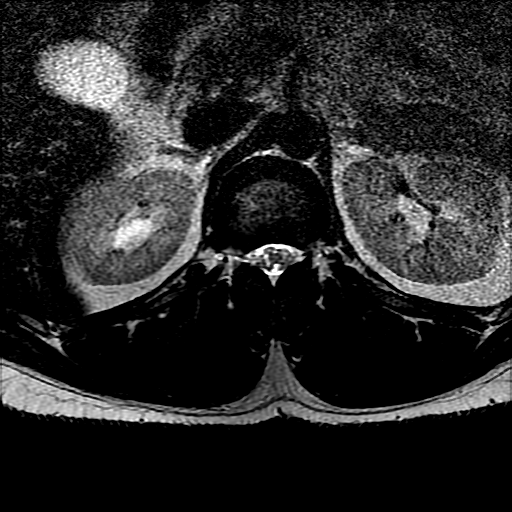
[im 12/36]
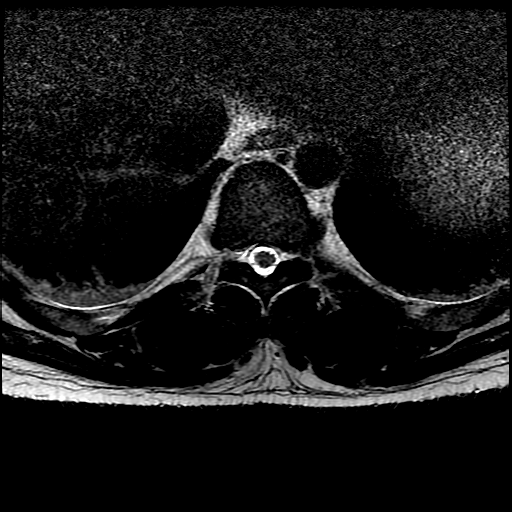
[im 24/36]
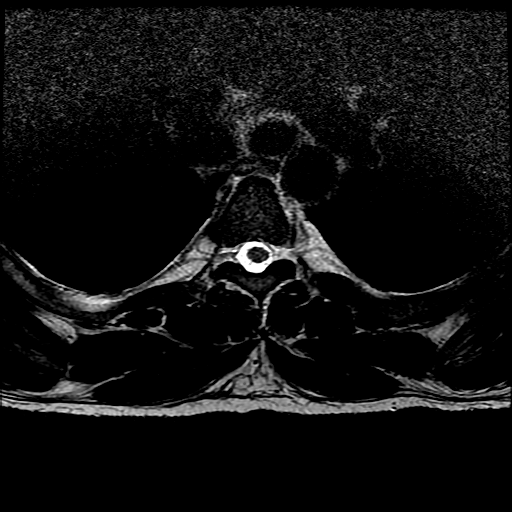
[im 36/36]
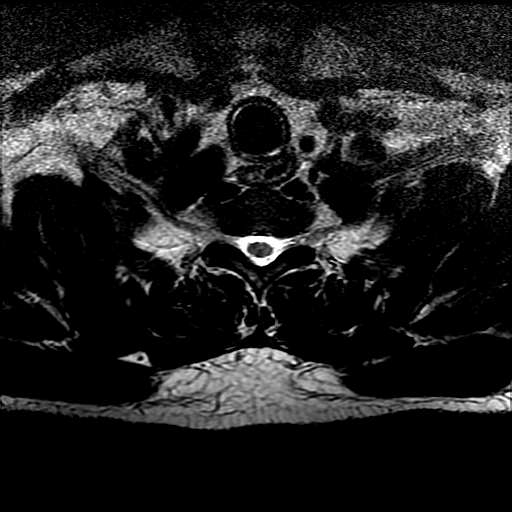

[Series 20: T1 · axial · non-contrast · 4.0mm · 0.43mm/px · z∈[-497,-261]mm · 4 of 36 slices shown (2 of 2)]
[im 1/36]
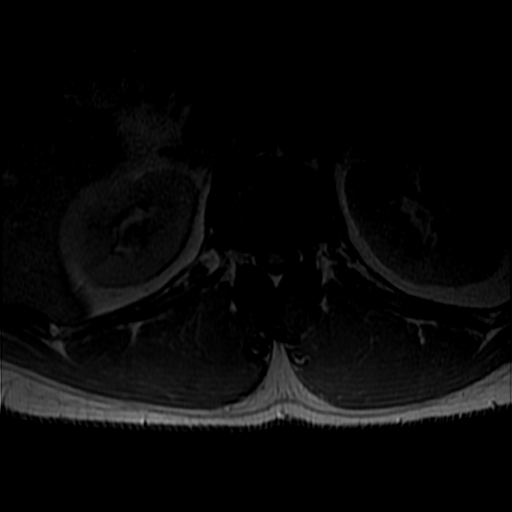
[im 12/36]
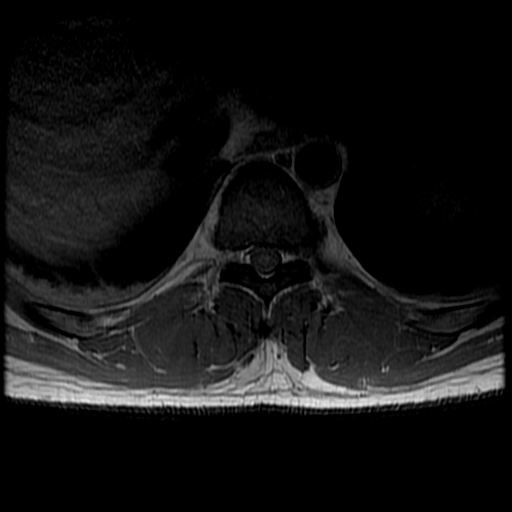
[im 24/36]
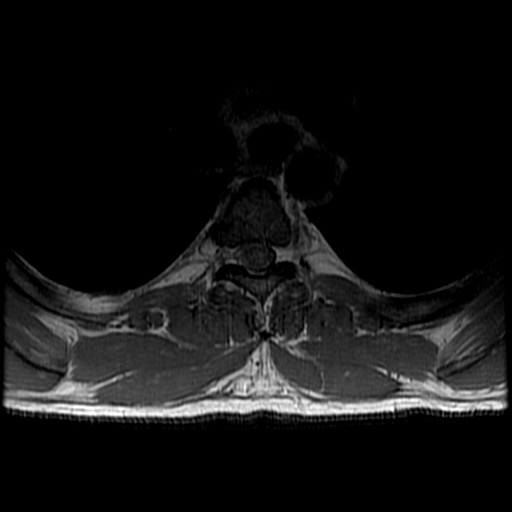
[im 36/36]
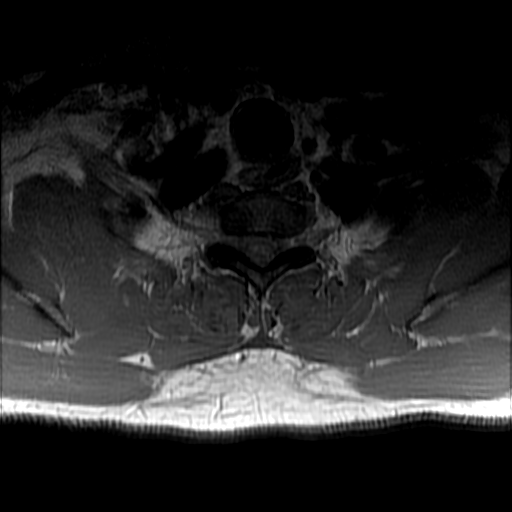

[Series 24: T2 · sagittal · 4.0mm · 0.55mm/px · 2 of 14 slices shown (3 of 4)]
[im 1/14]
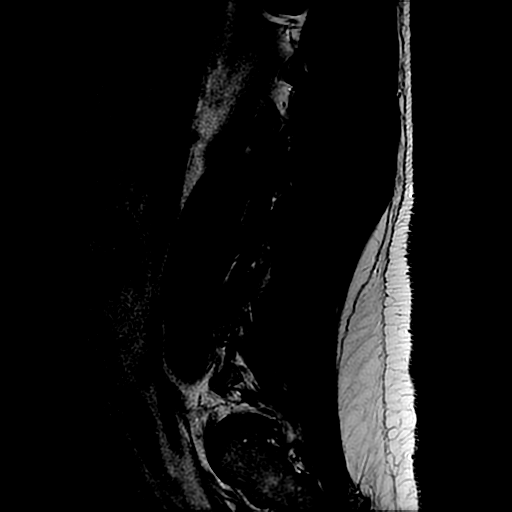
[im 14/14]
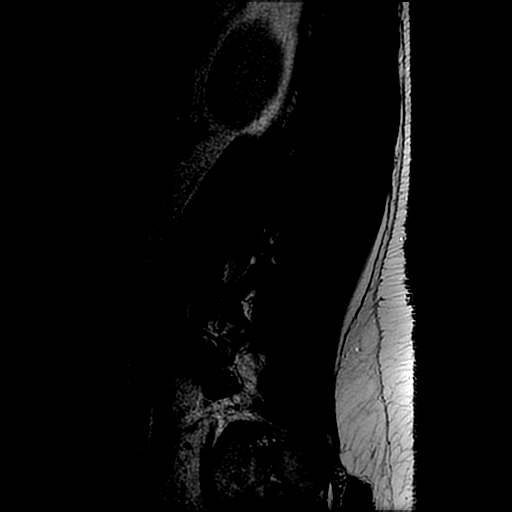

[Series 27: T2 · axial · 4.0mm · 0.39mm/px · z∈[-727,-520]mm · 5 of 40 slices shown (4 of 4)]
[im 1/40]
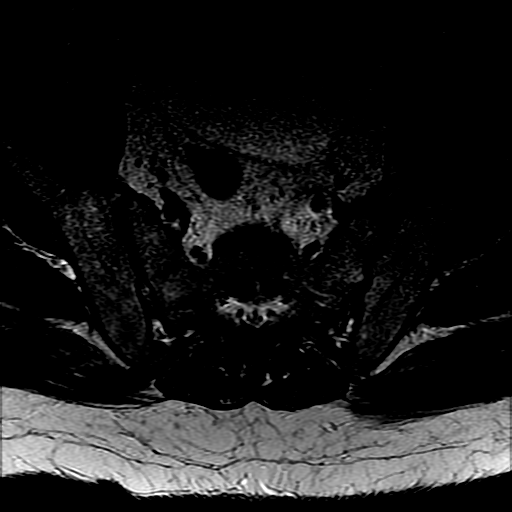
[im 10/40]
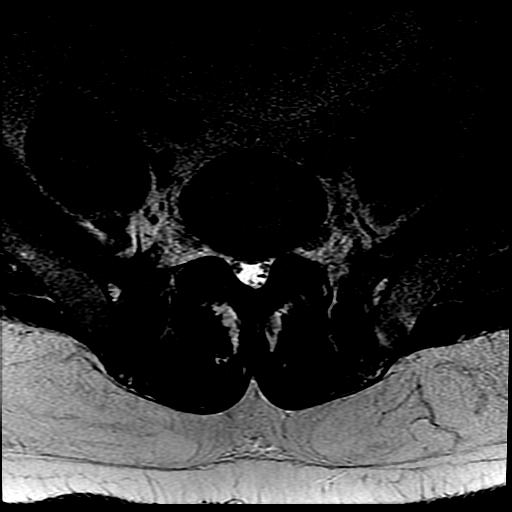
[im 20/40]
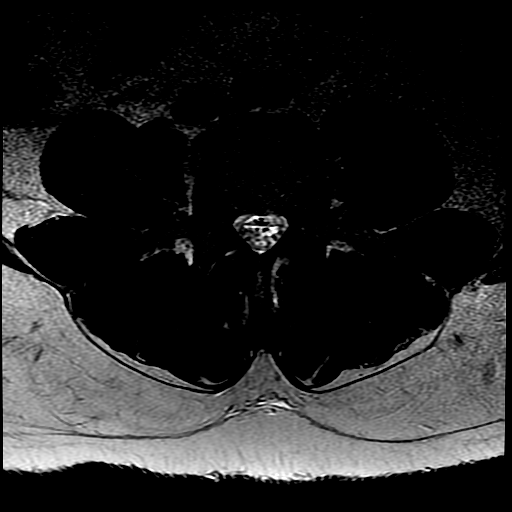
[im 30/40]
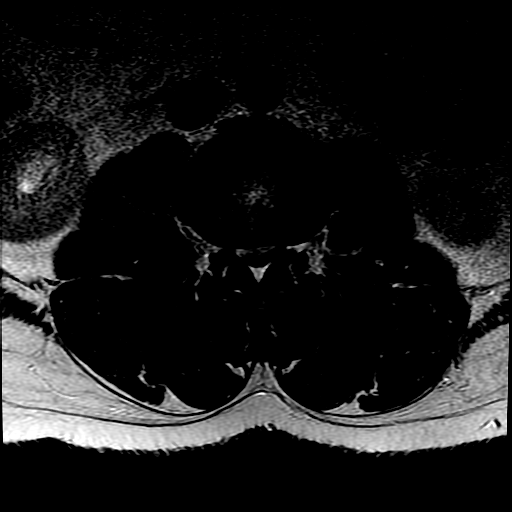
[im 40/40]
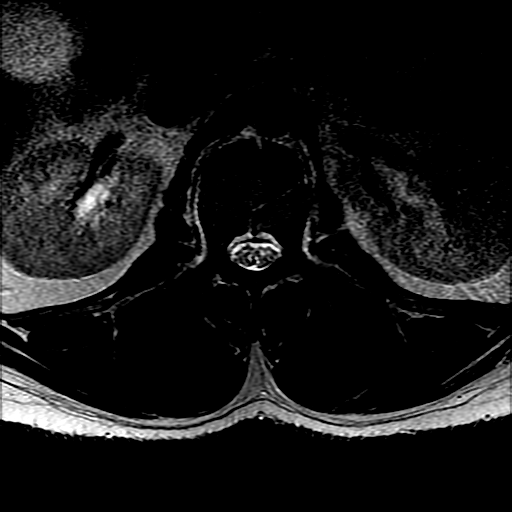

[19 of 48 positions shown; findings below may reference images not displayed]

FINDINGS: Segmentation:  5 lumbar type vertebral bodies.

Alignment:  Straightening of the normal lumbar lordosis.

Vertebrae:  No fracture or primary bone lesion.

Conus medullaris and cauda equina: Conus extends to the T12-L1
level. Conus and cauda equina appear normal.

Paraspinal and other soft tissues: Distended bladder

Disc levels:

L1-2: Circumferential bulging of the disc. Mild stenosis without
visible neural compression.

L2-3: Broad-based disc herniation with caudal migration behind upper
L3. Severe spinal stenosis with effacement of the subarachnoid space
and likely nerve compression.

L3-4: Bulging of the disc. Mild stenosis of both lateral recesses
and foramina without definite neural compression.

L4-5: Bulging of the disc more towards the left. Mild stenosis of
both lateral recesses. Left foraminal narrowing with some potential
for neural compression.

L5-S1: Disc bulge. Mild facet hypertrophy. No compressive stenosis.
IMPRESSION: L2-3: Large broad-based disc herniation with a large caudally
migrated fragment behind L3 compressing the thecal sac and likely to
cause neural compression. This is quite likely the significant
lesion in this case.

Disc bulges at L1-2, L3-4 and L4-5 as noted above with lateral
recess and foraminal stenoses but no definite neural compression.

## 2018-11-07 SURGERY — MRI WITH ANESTHESIA
Anesthesia: General

## 2018-11-07 MED ORDER — GADOBUTROL 1 MMOL/ML IV SOLN
10.0000 mL | Freq: Once | INTRAVENOUS | Status: AC | PRN
  Administered 2018-11-07: 17:00:00 10 mL via INTRAVENOUS

## 2018-11-07 MED ORDER — FENTANYL CITRATE (PF) 100 MCG/2ML IJ SOLN
25.0000 ug | INTRAMUSCULAR | Status: DC | PRN
  Administered 2018-11-07 (×2): 50 ug via INTRAVENOUS

## 2018-11-07 MED ORDER — SODIUM CHLORIDE 0.9 % IV SOLN: INTRAVENOUS | Status: AC

## 2018-11-07 MED ORDER — DIAZEPAM 5 MG PO TABS
5.0000 mg | ORAL_TABLET | Freq: Four times a day (QID) | ORAL | Status: DC | PRN
  Administered 2018-11-09: 5 mg via ORAL
  Filled 2018-11-07: qty 1

## 2018-11-07 MED ORDER — OXYCODONE-ACETAMINOPHEN 5-325 MG PO TABS
1.0000 | ORAL_TABLET | ORAL | Status: DC | PRN
  Administered 2018-11-07 – 2018-11-08 (×3): 1 via ORAL
  Filled 2018-11-07 (×4): qty 1

## 2018-11-07 MED ORDER — MEPERIDINE HCL 25 MG/ML IJ SOLN: 6.2500 mg | INTRAMUSCULAR | Status: DC | PRN

## 2018-11-07 MED ORDER — MIDAZOLAM HCL 2 MG/2ML IJ SOLN
INTRAMUSCULAR | Status: DC | PRN
  Administered 2018-11-07: 2 mg via INTRAVENOUS

## 2018-11-07 MED ORDER — ACETAMINOPHEN 650 MG RE SUPP: 650.0000 mg | Freq: Four times a day (QID) | RECTAL | Status: DC | PRN

## 2018-11-07 MED ORDER — HYDROMORPHONE HCL 1 MG/ML IJ SOLN
1.0000 mg | INTRAMUSCULAR | Status: DC | PRN
  Administered 2018-11-07 – 2018-11-09 (×5): 1 mg via INTRAVENOUS
  Filled 2018-11-07 (×4): qty 1

## 2018-11-07 MED ORDER — DEXAMETHASONE SODIUM PHOSPHATE 10 MG/ML IJ SOLN
INTRAMUSCULAR | Status: DC | PRN
  Administered 2018-11-07: 10 mg via INTRAVENOUS

## 2018-11-07 MED ORDER — HYDROMORPHONE HCL 1 MG/ML IJ SOLN
1.0000 mg | Freq: Once | INTRAMUSCULAR | Status: AC
  Administered 2018-11-07: 05:00:00 1 mg via INTRAVENOUS
  Filled 2018-11-07: qty 1

## 2018-11-07 MED ORDER — FENTANYL CITRATE (PF) 100 MCG/2ML IJ SOLN
INTRAMUSCULAR | Status: DC | PRN
  Administered 2018-11-07: 200 ug via INTRAVENOUS

## 2018-11-07 MED ORDER — LACTATED RINGERS IV SOLN
INTRAVENOUS | Status: DC
  Administered 2018-11-08 – 2018-11-09 (×2): via INTRAVENOUS

## 2018-11-07 MED ORDER — HYDROMORPHONE HCL 1 MG/ML IJ SOLN
1.0000 mg | Freq: Once | INTRAMUSCULAR | Status: DC
  Filled 2018-11-07: qty 1

## 2018-11-07 MED ORDER — HYDROMORPHONE HCL 1 MG/ML IJ SOLN: 1.0000 mg | Freq: Once | INTRAMUSCULAR | Status: DC

## 2018-11-07 MED ORDER — FENTANYL CITRATE (PF) 100 MCG/2ML IJ SOLN
INTRAMUSCULAR | Status: AC
  Filled 2018-11-07: qty 2

## 2018-11-07 MED ORDER — ACETAMINOPHEN 325 MG PO TABS
650.0000 mg | ORAL_TABLET | Freq: Four times a day (QID) | ORAL | Status: DC | PRN
  Administered 2018-11-10 – 2018-11-11 (×3): 650 mg via ORAL
  Filled 2018-11-07 (×3): qty 2

## 2018-11-07 MED ORDER — LACTATED RINGERS IV SOLN
INTRAVENOUS | Status: DC | PRN
  Administered 2018-11-07 (×2): via INTRAVENOUS

## 2018-11-07 MED ORDER — PHENOL 1.4 % MT LIQD
1.0000 | OROMUCOSAL | Status: DC | PRN
  Administered 2018-11-07: 1 via OROMUCOSAL
  Filled 2018-11-07: qty 177

## 2018-11-07 MED ORDER — LIDOCAINE HCL (PF) 1 % IJ SOLN
5.0000 mL | Freq: Once | INTRAMUSCULAR | Status: AC
  Administered 2018-11-07: 03:00:00 5 mL
  Filled 2018-11-07: qty 5

## 2018-11-07 MED ORDER — PROPOFOL 10 MG/ML IV BOLUS
INTRAVENOUS | Status: DC | PRN
  Administered 2018-11-07: 200 mg via INTRAVENOUS

## 2018-11-07 MED ORDER — MELOXICAM 7.5 MG PO TABS
15.0000 mg | ORAL_TABLET | Freq: Every day | ORAL | Status: DC
  Administered 2018-11-08: 11:00:00 15 mg via ORAL
  Filled 2018-11-07 (×2): qty 2

## 2018-11-07 MED ORDER — ROCURONIUM BROMIDE 50 MG/5ML IV SOSY
PREFILLED_SYRINGE | INTRAVENOUS | Status: DC | PRN
  Administered 2018-11-07: 100 mg via INTRAVENOUS

## 2018-11-07 NOTE — H&P (Addendum)
TRH H&P    Patient Demographics:    Joseph Reeves, is a 53 y.o. male  MRN: [id number]  DOB - November 02, 1965  Admit Date - 11/06/2018  Referring MD/NP/PA: Ward  Outpatient Primary MD for the patient is Patient, No Pcp Per  Patient coming from:  Home[facility] med [facility]  Chief complaint- weakness, numbness, tingling   HPI:    Joseph Reeves  is a 53 y.o. male, w h/o sciatica s/p ESI in the remote past apparently complains numbness and tingling in bilateral legs since Monday.  Patient also notes weakness.  Patient also notes that he has had back pain in the lower back as well.  Patient denies fever chills, weight loss, incontinence.  Patient denies any recent viral illness or exposure to sick contacts.  Patient was initially seen at [facility] and then transferred to [facility], ER for LP as well as MRI   In ED Temperature 99.3 pulse 88 respiratory rate 20 blood pressure 151/90, pulse ox 100% on room air Weight 104.3 kg  CT brain IMPRESSION: Negative non contrasted CT appearance of the brain.  CXR IMPRESSION: Normal exam.    Sodium 141, potassium 3.7, BUN 27, creatinine 1.11, AST 26, ALT 22, glucose 130 WBC 13.5, hemoglobin 12.7, platelet count 261 HIV antibody test nonreactive ESR 15 Urinalysis negative  UDS positive for benzodiazepine  LP results prot pending, glucose 66 Cell count and diff pending VDRL CSF pending  Dr. Malen Gauze was apparently consulted and will follow up on LP results and neurology will apparently consult if MRI is negative. Neurosurgery was consulted.  They will follow the patient per ED  Patient will be admitted for lower back pain, weakness, numbness, tingling of the bilateral lower extremities    Review of systems:    In addition to the HPI above,  No Fever-chills, No Headache, No changes with Vision or hearing, No problems swallowing food or  Liquids, No Chest pain, Cough or Shortness of Breath, No Abdominal pain, No Nausea or Vomiting, bowel movements are regular, No Blood in stool or Urine, No dysuria, No new skin rashes or bruises, No new joints pains-aches,    No recent weight gain or loss, No polyuria, polydypsia or polyphagia, No significant Mental Stressors.  All other systems reviewed and are negative.    Past History of the following :    Past Medical History:  Diagnosis Date  . Sciatica       History reviewed. No pertinent surgical history. No prior surgical history per patient   Social History:      Social History   Tobacco Use  . Smoking status: Light Tobacco Smoker    Types: Cigars  . Smokeless tobacco: Never Used  Substance Use Topics  . Alcohol use: Yes    Frequency: Never    Comment: occasional       Family History :     Family History  Problem Relation Age of Onset  . Hypertension Father   . Heart failure Father  Home Medications:   Prior to Admission medications   Medication Sig Start Date End Date Taking? Authorizing Provider  diazepam (VALIUM) 5 MG tablet Take 1 tablet (5 mg total) by mouth every 6 (six) hours as needed for anxiety (spasms). 11/04/18  Yes Drenda Freeze, MD  meloxicam (MOBIC) 15 MG tablet Take 1 tablet daily for back pain. 11/02/18  Yes Molpus, John, MD  oxyCODONE-acetaminophen (PERCOCET) 5-325 MG tablet Take 1 tablet by mouth every 4 (four) hours as needed for severe pain. 11/02/18  Yes Molpus, John, MD  predniSONE (DELTASONE) 20 MG tablet Take 60 mg daily x 2 days then 40 mg daily x 2 days then 20 mg daily x 2 days Patient taking differently: Take 20-60 mg by mouth See admin instructions. Take 3 tablets for 2 days then take 2 tablets for 2 days then take 1 tablet for 2 days 11/04/18  Yes Drenda Freeze, MD     Allergies:    No Known Allergies   Physical Exam:   Vitals  Blood pressure 119/65, pulse (!) 51, temperature 98.5 F (36.9  C), temperature source Oral, resp. rate 18, height 6' 1" (1.854 m), weight 104.3 kg, SpO2 97 %.  1.  General: Alert and oriented x3  2. Psychiatric: Euthymic  3. Neurologic: Cranial nerves II through XII intact, reflexes 2+, symmetric, diffuse with downgoing toes bilaterally, motor strength 5/5 in all 4 extremities  4. HEENMT:  Anicteric, pupils 1.5 mm, symmetric, direct, consensual, near intact Neck: No JVD  5. Respiratory : CTAB  6. Cardiovascular : Regular rate rhythm S1-S2 no murmurs gallops or rubs  7. Gastrointestinal:  Abdomen: Soft nontender nondistended positive bowel sounds  8. Skin:  Extremities no cyanosis clubbing or edema No rash  9.Musculoskeletal:  Good range of motion    Data Review:    CBC Recent Labs  Lab 11/06/18 1651  WBC 13.5*  HGB 12.7*  HCT 39.4  PLT 261  MCV 97.8  MCH 31.5  MCHC 32.2  RDW 12.6  LYMPHSABS 1.4  MONOABS 0.9  EOSABS 0.0  BASOSABS 0.0   ------------------------------------------------------------------------------------------------------------------  Results for orders placed or performed during the hospital encounter of 11/06/18 (from the past 48 hour(s))  Comprehensive metabolic panel     Status: Abnormal   Collection Time: 11/06/18  4:51 PM  Result Value Ref Range   Sodium 141 135 - 145 mmol/L   Potassium 3.7 3.5 - 5.1 mmol/L   Chloride 102 98 - 111 mmol/L   CO2 27 22 - 32 mmol/L   Glucose, Bld 130 (H) 70 - 99 mg/dL   BUN 27 (H) 6 - 20 mg/dL   Creatinine, Ser 1.11 0.61 - 1.24 mg/dL   Calcium 9.1 8.9 - 10.3 mg/dL   Total Protein 7.7 6.5 - 8.1 g/dL   Albumin 4.4 3.5 - 5.0 g/dL   AST 26 15 - 41 U/L   ALT 22 0 - 44 U/L   Alkaline Phosphatase 35 (L) 38 - 126 U/L   Total Bilirubin 0.5 0.3 - 1.2 mg/dL   GFR calc non Af Amer >60 >60 mL/min   GFR calc Af Amer >60 >60 mL/min   Anion gap 12 5 - 15    Comment: Performed at [facility], [address] [place], [place] [address]  Lipase, blood      Status: None   Collection Time: 11/06/18  4:51 PM  Result Value Ref Range   Lipase 22 11 - 51 U/L  Comment: Performed at [facility], [address] [place], [place] [address]  CBC with Differential     Status: Abnormal   Collection Time: 11/06/18  4:51 PM  Result Value Ref Range   WBC 13.5 (H) 4.0 - 10.5 K/uL    Comment: REPEATED TO VERIFY WHITE COUNT CONFIRMED ON SMEAR    RBC 4.03 (L) 4.22 - 5.81 MIL/uL   Hemoglobin 12.7 (L) 13.0 - 17.0 g/dL   HCT 39.4 39.0 - 52.0 %   MCV 97.8 80.0 - 100.0 fL   MCH 31.5 26.0 - 34.0 pg   MCHC 32.2 30.0 - 36.0 g/dL   RDW 12.6 11.5 - 15.5 %   Platelets 261 150 - 400 K/uL   nRBC 0.0 0.0 - 0.2 %   Neutrophils Relative % 83 %   Neutro Abs 11.2 (H) 1.7 - 7.7 K/uL   Lymphocytes Relative 10 %   Lymphs Abs 1.4 0.7 - 4.0 K/uL   Monocytes Relative 7 %   Monocytes Absolute 0.9 0.1 - 1.0 K/uL   Eosinophils Relative 0 %   Eosinophils Absolute 0.0 0.0 - 0.5 K/uL   Basophils Relative 0 %   Basophils Absolute 0.0 0.0 - 0.1 K/uL   Immature Granulocytes 0 %   Abs Immature Granulocytes 0.03 0.00 - 0.07 K/uL   Stomatocytes PRESENT    Giant PLTs PRESENT     Comment: Performed at [facility], [address] [place], [place] [address]  HIV Antibody (routine testing w rflx)     Status: None   Collection Time: 11/06/18  4:51 PM  Result Value Ref Range   HIV Screen 4th Generation wRfx NON REACTIVE NON REACTIVE    Comment: Performed at [facility], [address] [place], [place] [address]  Sedimentation rate     Status: None   Collection Time: 11/06/18  4:51 PM  Result Value Ref Range   Sed Rate 15 0 - 16 mm/hr    Comment: Performed at [facility], [address] [place], [place] [address]  C-reactive protein     Status: None   Collection Time: 11/06/18  4:51 PM  Result Value Ref Range   CRP <0.8 <1.0 mg/dL    Comment: Performed at [facility], [place] [address] [place], [place] [address]  SARS  Coronavirus 2 by RT PCR (hospital order, performed in [facility] hospital lab) Nasopharyngeal Nasopharyngeal Swab     Status: None   Collection Time: 11/06/18  6:01 PM   Specimen: Nasopharyngeal Swab  Result Value Ref Range   SARS Coronavirus 2 NEGATIVE NEGATIVE    Comment: (NOTE) If result is NEGATIVE SARS-CoV-2 target nucleic acids are NOT DETECTED. The SARS-CoV-2 RNA is generally detectable in upper and lower  respiratory specimens during the acute phase of infection. The lowest  concentration of SARS-CoV-2 viral copies this assay can detect is 250  copies / mL. A negative result does not preclude SARS-CoV-2 infection  and should not be used as the sole basis for treatment or other  patient management decisions.  A negative result may occur with  improper specimen collection / handling, submission of specimen other  than nasopharyngeal swab, presence of viral mutation(s) within the  areas targeted by this assay, and inadequate number of viral copies  (<250 copies / mL). A negative result must be combined with clinical  observations, patient history, and epidemiological information. If result is POSITIVE SARS-CoV-2 target nucleic acids  are DETECTED. The SARS-CoV-2 RNA is generally detectable in upper and lower  respiratory specimens dur ing the acute phase of infection.  Positive  results are indicative of active infection with SARS-CoV-2.  Clinical  correlation with patient history and other diagnostic information is  necessary to determine patient infection status.  Positive results do  not rule out bacterial infection or co-infection with other viruses. If result is PRESUMPTIVE POSTIVE SARS-CoV-2 nucleic acids MAY BE PRESENT.   A presumptive positive result was obtained on the submitted specimen  and confirmed on repeat testing.  While 2019 novel coronavirus  (SARS-CoV-2) nucleic acids may be present in the submitted sample  additional confirmatory testing may be necessary for  epidemiological  and / or clinical management purposes  to differentiate between  SARS-CoV-2 and other Sarbecovirus currently known to infect humans.  If clinically indicated additional testing with an alternate test  methodology [phone]) is advised. The SARS-CoV-2 RNA is generally  detectable in upper and lower respiratory sp ecimens during the acute  phase of infection. The expected result is Negative. Fact Sheet for Patients:  StrictlyIdeas.no Fact Sheet for Healthcare Providers: BankingDealers.co.za This test is not yet approved or cleared by the [place] FDA and has been authorized for detection and/or diagnosis of SARS-CoV-2 by FDA under an Emergency Use Authorization (EUA).  This EUA will remain in effect (meaning this test can be used) for the duration of the COVID-19 declaration under Section 564(b)(1) of the Act, 21 U.S.C. section 360bbb-3(b)(1), unless the authorization is terminated or revoked sooner. Performed at [facility], [address] [place], [place] [address]   Urine rapid drug screen (hosp performed)     Status: Abnormal   Collection Time: 11/06/18  6:20 PM  Result Value Ref Range   Opiates NONE DETECTED NONE DETECTED   Cocaine NONE DETECTED NONE DETECTED   Benzodiazepines POSITIVE (A) NONE DETECTED   Amphetamines NONE DETECTED NONE DETECTED   Tetrahydrocannabinol NONE DETECTED NONE DETECTED   Barbiturates NONE DETECTED NONE DETECTED    Comment: (NOTE) DRUG SCREEN FOR MEDICAL PURPOSES ONLY.  IF CONFIRMATION IS NEEDED FOR ANY PURPOSE, NOTIFY LAB WITHIN 5 DAYS. LOWEST DETECTABLE LIMITS FOR URINE DRUG SCREEN Drug Class                     Cutoff (ng/mL) Amphetamine and metabolites    1000 Barbiturate and metabolites    200 Benzodiazepine                 580 Tricyclics and metabolites     300 Opiates and metabolites        300 Cocaine and metabolites        300 THC                             50 Performed at [place] [facility], [address] [place], [place] [address]   Urinalysis, Routine w reflex microscopic     Status: Abnormal   Collection Time: 11/06/18  6:20 PM  Result Value Ref Range   Color, Urine YELLOW YELLOW   APPearance CLEAR CLEAR   Specific Gravity, Urine >1.030 (H) 1.005 - 1.030   pH 5.5 5.0 - 8.0   Glucose, UA NEGATIVE NEGATIVE mg/dL   Hgb urine dipstick TRACE (A) NEGATIVE   Bilirubin Urine NEGATIVE NEGATIVE   Ketones, ur NEGATIVE NEGATIVE mg/dL   Protein, ur NEGATIVE NEGATIVE mg/dL  Nitrite NEGATIVE NEGATIVE   Leukocytes,Ua NEGATIVE NEGATIVE    Comment: Performed at [facility], [address] [place], [place] [address]  Urinalysis, Microscopic (reflex)     Status: Abnormal   Collection Time: 11/06/18  6:20 PM  Result Value Ref Range   RBC / HPF 0-5 0 - 5 RBC/hpf   WBC, UA 0-5 0 - 5 WBC/hpf   Bacteria, UA RARE (A) NONE SEEN   Squamous Epithelial / LPF 0-5 0 - 5   Mucus PRESENT     Comment: Performed at [facility], [address] [place], [place] [address]  CSF culture     Status: None (Preliminary result)   Collection Time: 11/07/18  3:05 AM   Specimen: CSF; Cerebrospinal Fluid  Result Value Ref Range   Specimen Description CSF    Special Requests TUBE 2    Gram Stain      CYTOSPIN SMEAR RARE WBC PRESENT, PREDOMINANTLY PMN NO ORGANISMS SEEN Performed at [facility], [place] [address], [place], [place] [address]    Culture PENDING    Report Status PENDING   Protein and glucose, CSF     Status: None (Preliminary result)   Collection Time: 11/07/18  3:05 AM  Result Value Ref Range   Glucose, CSF 66 40 - 70 mg/dL    Comment: Performed at [facility] [address], [place], [place] [address]   Total  Protein, CSF PENDING 15 - 45 mg/dL    Chemistries  Recent Labs  Lab 11/06/18 1651  NA 141  K 3.7  CL 102  CO2 27  GLUCOSE 130*  BUN 27*  CREATININE 1.11  CALCIUM 9.1  AST 26  ALT 22   ALKPHOS 35*  BILITOT 0.5   ------------------------------------------------------------------------------------------------------------------  ------------------------------------------------------------------------------------------------------------------ GFR: Estimated Creatinine Clearance: 98.8 mL/min (by C-G formula based on SCr of 1.11 mg/dL). Liver Function Tests: Recent Labs  Lab 11/06/18 1651  AST 26  ALT 22  ALKPHOS 35*  BILITOT 0.5  PROT 7.7  ALBUMIN 4.4   Recent Labs  Lab 11/06/18 1651  LIPASE 22   No results for input(s): AMMONIA in the last 168 hours. Coagulation Profile: No results for input(s): INR, PROTIME in the last 168 hours. Cardiac Enzymes: No results for input(s): CKTOTAL, CKMB, CKMBINDEX, TROPONINI in the last 168 hours. BNP (last 3 results) No results for input(s): PROBNP in the last 8760 hours. HbA1C: No results for input(s): HGBA1C in the last 72 hours. CBG: No results for input(s): GLUCAP in the last 168 hours. Lipid Profile: No results for input(s): CHOL, HDL, LDLCALC, TRIG, CHOLHDL, LDLDIRECT in the last 72 hours. Thyroid Function Tests: No results for input(s): TSH, T4TOTAL, FREET4, T3FREE, THYROIDAB in the last 72 hours. Anemia Panel: No results for input(s): VITAMINB12, FOLATE, FERRITIN, TIBC, IRON, RETICCTPCT in the last 72 hours.  --------------------------------------------------------------------------------------------------------------- Urine analysis:    Component Value Date/Time   COLORURINE YELLOW 11/06/2018 1820   APPEARANCEUR CLEAR 11/06/2018 1820   LABSPEC >1.030 (H) 11/06/2018 1820   PHURINE 5.5 11/06/2018 1820   GLUCOSEU NEGATIVE 11/06/2018 1820   HGBUR TRACE (A) 11/06/2018 1820   BILIRUBINUR NEGATIVE 11/06/2018 [place] 11/06/2018 1820   PROTEINUR NEGATIVE 11/06/2018 1820   NITRITE NEGATIVE 11/06/2018 1820   LEUKOCYTESUR NEGATIVE 11/06/2018 1820      Imaging Results:    Dg Chest 2 View   Result Date: 11/06/2018 CLINICAL DATA:  Leg weakness, light smoker EXAM: CHEST - 2  VIEW COMPARISON:  None FINDINGS: Normal heart size, mediastinal contours, and pulmonary vascularity. Lungs clear. No pleural effusion or pneumothorax. Bones unremarkable. IMPRESSION: Normal exam.  A Electronically Signed   By: Lavonia Dana M.D.   On: 11/06/2018 18:18   Ct Head Wo Contrast  Result Date: 11/06/2018 CLINICAL DATA:  Lower extremity weakness EXAM: CT HEAD WITHOUT CONTRAST TECHNIQUE: Contiguous axial images were obtained from the base of the skull through the vertex without intravenous contrast. COMPARISON:  MRI 11/04/2018 FINDINGS: Brain: No acute territorial infarction, hemorrhage or intracranial mass. The ventricles are nonenlarged. Vascular: No hyperdense vessel or unexpected calcification. Skull: Normal. Negative for fracture or focal lesion. Sinuses/Orbits: No acute finding. Other: None IMPRESSION: Negative non contrasted CT appearance of the brain. Electronically Signed   By: Donavan Foil M.D.   On: 11/06/2018 17:53      Assessment & Plan:    Principal Problem:   Leg weakness Active Problems:   Numbness and tingling of both legs  Numbness, tingling, weakness of bilateral lower extremities Back pain NPO in case of need for surgery Hydrate with ns iv Cont meloxicam Cont percocet prn Stop prednisone for now Check MRI brain, T-spine, L-spine Check CPK, TSH, ESR, ANA, B12, SPEP, immunofixation Please follow-up on LP results Neurosurgery consulted by ED, appreciate input Neurology consulted by ED, appreciate input  Anemia Check ferritin, iron, tibc, b12, folate, tsh, spep, immunofixation Check cbc in am  Leukocytosis ? Related to prednisone Check cbc in am  DVT Prophylaxis-    SCDs    AM Labs Ordered, also please review Full Orders  Family Communication: Admission, patients condition and plan of care including tests being ordered have been discussed with the patient  who indicate  understanding and agree with the plan and Code Status.  Code Status:  FULL CODE per patient, significant other is present with patient in ED.   Admission status: Observation: Based on patients clinical presentation and evaluation of above clinical data, I have made determination that patient meets observation criteria at this time.   Time spent in minutes : 55 minutes   Jani Gravel M.D on 11/07/2018 at 5:45 AM

## 2018-11-07 NOTE — Progress Notes (Signed)
Same day note  Patient admitted this morning for tingling of the lower extremities.  CT Scan of brain was negative.  Neurology has seen the patient.  Recommend an MRI.  Neurosurgery was also consulted due to sciatica.  Plain MRI of the lumbar spine shows severe canal stenosis at L2-L3.  Lumbar puncture was bloody tap with difficulty interpretation of results due to excess RBCs.  MRI of the thoracic and lumbar spine with and without contrast has been ordered to rule out transverse myelitis, including MRI of the brain.  Will follow neurology, neurosurgery recommendations and further care.

## 2018-11-07 NOTE — ED Notes (Signed)
Awaiting verification of mobic 15mg  by pharmacist to administer. Pt resting in room with lights down at this time.

## 2018-11-07 NOTE — ED Notes (Signed)
Original plan for this pt was to have dilaudid before going to MRI and if this was unsuccessful (had already been unable to tolerate one attempt in the night) for pt to be sedated for MRI. When MRI called this AM, shared that test would likely be 2 hrs due to being 3 studies with contrast. The pt states that he does not think he will be able to tolerate this test without sedation, would like to move forward with sedation plan instead of attempting with dilaudid. Attending messaged (Dr. Louanne Belton), [facility] with this RN communicating this to MRI to start the process to set up MRI with sedation (anaethesia to manage sedation, not ED staff).

## 2018-11-07 NOTE — ED Notes (Signed)
Pt moved to treatment room 47 for LP procedure

## 2018-11-07 NOTE — Anesthesia Postprocedure Evaluation (Signed)
Anesthesia Post Note  Patient: Joseph Reeves  Procedure(s) Performed: MRI WITH ANESTHESIA (N/A )     Patient location during evaluation: PACU Anesthesia Type: General Level of consciousness: awake and alert Pain management: pain level controlled Vital Signs Assessment: post-procedure vital signs reviewed and stable Respiratory status: spontaneous breathing, nonlabored ventilation, respiratory function stable and patient connected to nasal cannula oxygen Cardiovascular status: blood pressure returned to baseline and stable Postop Assessment: no apparent nausea or vomiting Anesthetic complications: no    Last Vitals:  Vitals:   11/07/18 1130 11/07/18 1145  BP: 121/65 123/69  Pulse: (!) 51 (!) 54  Resp: (!) 22 18  Temp:    SpO2: 96% 100%    Last Pain:  Vitals:   11/07/18 0739  TempSrc:   PainSc: Asleep                 Trust Crago DAVID

## 2018-11-07 NOTE — Anesthesia Preprocedure Evaluation (Signed)
Anesthesia Evaluation  Patient identified by MRN, date of birth, ID band Patient awake    Reviewed: Allergy & Precautions, NPO status , Patient's Chart, lab work & pertinent test results  Airway Mallampati: I       Dental no notable dental hx. (+) Teeth Intact   Pulmonary Current Smoker and Patient abstained from smoking.,    Pulmonary exam normal breath sounds clear to auscultation       Cardiovascular negative cardio ROS Normal cardiovascular exam Rhythm:Regular Rate:Normal     Neuro/Psych  Neuromuscular disease negative psych ROS   GI/Hepatic negative GI ROS, Neg liver ROS,   Endo/Other  negative endocrine ROS  Renal/GU negative Renal ROS     Musculoskeletal   Abdominal Normal abdominal exam  (+)   Peds  Hematology negative hematology ROS (+)   Anesthesia Other Findings   Reproductive/Obstetrics                             Anesthesia Physical Anesthesia Plan  ASA: II  Anesthesia Plan: General   Post-op Pain Management:    Induction: Intravenous  PONV Risk Score and Plan: 1 and Ondansetron  Airway Management Planned: Oral ETT  Additional Equipment: None  Intra-op Plan:   Post-operative Plan: Extubation in OR  Informed Consent: I have reviewed the patients History and Physical, chart, labs and discussed the procedure including the risks, benefits and alternatives for the proposed anesthesia with the patient or authorized representative who has indicated his/her understanding and acceptance.     Dental advisory given  Plan Discussed with: CRNA  Anesthesia Plan Comments:         Anesthesia Quick Evaluation

## 2018-11-07 NOTE — ED Notes (Signed)
Pt called sister to notify that he would be leaving floor for procedure

## 2018-11-07 NOTE — Consult Note (Signed)
Chief Complaint   Chief Complaint  Patient presents with  . Back Pain    HPI   Consult requested by: Dr Leonides Schanz, EDP Reason for consult: lumbar spinal stenosis  HPI: Joseph Reeves is a 53 y.o. male with no past medical history who presents to ED for the third time in 5 days with severe back bilateral lower extremity pain. Symptoms started 10 days ago without injury and have been progressively worsening. Pain affects midline lower back and radiates into bilateral lower extremities posterolaterally to feet, right greater than left. Associated with N/T in the same distribution. Pain is typically brought on with ambulation. When he sits/lays down, pain significantly improves although does not resolve. Due to multiple UC/ER evaluations, he has been on multiple po medications including narcotics, benzos, muscle relaxers and steroids without relief.  Pain continues to worsen and affect ADLs. He is ambulating with a rolling walker due to severe pain and BLE weakness. 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 He also has multilevel foraminal disease. Given progression of symptoms, there was concern over GBS. Neurology was consulted. LP performed. MRI T and L spine pending. NSY consultation requested to weigh in.  There are no active problems to display for this patient.   PMH: Past Medical History:  Diagnosis Date  . Sciatica     PSH: History reviewed. No pertinent surgical history.  (Not in a hospital admission)   SH: Social History   Tobacco Use  . Smoking status: Light Tobacco Smoker  . Smokeless tobacco: Never Used  Substance Use Topics  . Alcohol use: Never    Frequency: Never  . Drug use: Never    MEDS: Prior to Admission medications   Medication Sig Start Date End Date Taking?  Authorizing Provider  diazepam (VALIUM) 5 MG tablet Take 1 tablet (5 mg total) by mouth every 6 (six) hours as needed for anxiety (spasms). 11/04/18  Yes Drenda Freeze, MD  meloxicam (MOBIC) 15 MG tablet Take 1 tablet daily for back pain. 11/02/18  Yes Molpus, John, MD  oxyCODONE-acetaminophen (PERCOCET) 5-325 MG tablet Take 1 tablet by mouth every 4 (four) hours as needed for severe pain. 11/02/18  Yes Molpus, John, MD  predniSONE (DELTASONE) 20 MG tablet Take 60 mg daily x 2 days then 40 mg daily x 2 days then 20 mg daily x 2 days Patient taking differently: Take 20-60 mg by mouth See admin instructions. Take 3 tablets for 2 days then take 2 tablets for 2 days then take 1 tablet for 2 days 11/04/18  Yes Drenda Freeze, MD    ALLERGY: No Known Allergies  Social History   Tobacco Use  . Smoking status: Light Tobacco Smoker  . Smokeless tobacco: Never Used  Substance Use Topics  . Alcohol use: Never    Frequency: Never     No family history on file.   ROS   Review of Systems  Constitutional: Negative.   HENT: Negative.   Eyes: Negative.   Respiratory: Negative.   Cardiovascular: Negative.   Gastrointestinal: Negative.   Musculoskeletal: Positive for back pain and myalgias. Negative for falls, joint pain and neck pain.  Skin: Negative.   Neurological: Positive for tingling (BLE, R>L), sensory change (numbness RLE) and weakness. Negative for dizziness, tremors, speech change, seizures and loss of consciousness.  Exam   Vitals:   11/06/18 2311 11/06/18 2357  BP:    Pulse: (!) 55 67  Resp: 18 18  Temp:    SpO2: 99% 97%   General appearance: WDWN, uncomfortable Eyes: No scleral injection Cardiovascular: Regular rate and rhythm without murmurs, rubs, gallops. No edema or variciosities. Distal pulses normal. Pulmonary: Effort normal, non-labored breathing Musculoskeletal:     Muscle tone upper extremities: Normal    Muscle tone lower extremities: Normal     Motor exam: Upper Extremities Deltoid Bicep Tricep Grip  Right 5/5 5/5 5/5 5/5  Left 5/5 5/5 5/5 5/5   Lower Extremity IP Quad PF DF EHL  Right 2/5 2/5 3/5 3/5 3/5  Left 4-/5 4-/5 4/5 4/5 4/5   Neurological Mental Status:    - Patient is awake, alert, oriented to person, place, month, year, and situation    - Patient is able to give a clear and coherent history.    - No signs of aphasia or neglect Cranial Nerves    - II: Visual Fields are full. PERRL    - III/IV/VI: EOMI without ptosis or diploplia.     - V: Facial sensation is grossly normal    - VII: Facial movement is symmetric.     - VIII: hearing is intact to voice    - X: Uvula elevates symmetrically    - XI: Shoulder shrug is symmetric.    - XII: tongue is midline without atrophy or fasciculations.  Sensory: decreased sensation RLE compared to LLE to light touch Deep Tendon Reflexes    - 1+ and symmetric in the biceps and patellae.   Gait: sister has video on phone. Antalgic gate  Results - Imaging/Labs   Results for orders placed or performed during the hospital encounter of 11/06/18 (from the past 48 hour(s))  Comprehensive metabolic panel     Status: Abnormal   Collection Time: 11/06/18  4:51 PM  Result Value Ref Range   Sodium 141 135 - 145 mmol/L   Potassium 3.7 3.5 - 5.1 mmol/L   Chloride 102 98 - 111 mmol/L   CO2 27 22 - 32 mmol/L   Glucose, Bld 130 (H) 70 - 99 mg/dL   BUN 27 (H) 6 - 20 mg/dL   Creatinine, Ser 1.60 0.61 - 1.24 mg/dL   Calcium 9.1 8.9 - 10.9 mg/dL   Total Protein 7.7 6.5 - 8.1 g/dL   Albumin 4.4 3.5 - 5.0 g/dL   AST 26 15 - 41 U/L   ALT 22 0 - 44 U/L   Alkaline Phosphatase 35 (L) 38 - 126 U/L   Total Bilirubin 0.5 0.3 - 1.2 mg/dL   GFR calc non Af Amer >60 >60 mL/min   GFR calc Af Amer >60 >60 mL/min   Anion gap 12 5 - 15    Comment: Performed at [facility], [address] [place], [place] [address]  Lipase, blood     Status: None   Collection Time: 11/06/18  4:51 PM   Result Value Ref Range   Lipase 22 11 - 51 U/L    Comment: Performed at [facility], [address] [place], [place] [address]  CBC with Differential     Status: Abnormal   Collection Time: 11/06/18  4:51 PM  Result Value Ref Range   WBC 13.5 (H) 4.0 - 10.5 K/uL    Comment: REPEATED TO VERIFY WHITE COUNT CONFIRMED ON SMEAR    RBC 4.03 (L)  4.22 - 5.81 MIL/uL   Hemoglobin 12.7 (L) 13.0 - 17.0 g/dL   HCT 96.0 45.4 - 09.8 %   MCV 97.8 80.0 - 100.0 fL   MCH 31.5 26.0 - 34.0 pg   MCHC 32.2 30.0 - 36.0 g/dL   RDW 11.9 14.7 - 82.9 %   Platelets 261 150 - 400 K/uL   nRBC 0.0 0.0 - 0.2 %   Neutrophils Relative % 83 %   Neutro Abs 11.2 (H) 1.7 - 7.7 K/uL   Lymphocytes Relative 10 %   Lymphs Abs 1.4 0.7 - 4.0 K/uL   Monocytes Relative 7 %   Monocytes Absolute 0.9 0.1 - 1.0 K/uL   Eosinophils Relative 0 %   Eosinophils Absolute 0.0 0.0 - 0.5 K/uL   Basophils Relative 0 %   Basophils Absolute 0.0 0.0 - 0.1 K/uL   Immature Granulocytes 0 %   Abs Immature Granulocytes 0.03 0.00 - 0.07 K/uL   Stomatocytes PRESENT    Giant PLTs PRESENT     Comment: Performed at [facility], [address] [place], [place] [address]  HIV Antibody (routine testing w rflx)     Status: None   Collection Time: 11/06/18  4:51 PM  Result Value Ref Range   HIV Screen 4th Generation wRfx NON REACTIVE NON REACTIVE    Comment: Performed at [facility], [address] [place], [place] [address]  Sedimentation rate     Status: None   Collection Time: 11/06/18  4:51 PM  Result Value Ref Range   Sed Rate 15 0 - 16 mm/hr    Comment: Performed at [facility], [address] [place], [place] [address]  C-reactive protein     Status: None   Collection Time: 11/06/18  4:51 PM  Result Value Ref Range   CRP <0.8 <1.0 mg/dL    Comment: Performed at [facility], [address], [place], [place] [address]  SARS Coronavirus 2 by RT PCR (hospital order, performed in [facility] hospital lab) Nasopharyngeal Nasopharyngeal Swab     Status: None   Collection Time: 11/06/18  6:01 PM   Specimen: Nasopharyngeal Swab  Result Value Ref Range   SARS Coronavirus 2 NEGATIVE NEGATIVE    Comment: (NOTE) If result is NEGATIVE SARS-CoV-2 target nucleic acids are NOT DETECTED. The SARS-CoV-2 RNA is generally detectable in upper and lower  respiratory specimens during the acute phase of infection. The lowest  concentration of SARS-CoV-2 viral copies this assay can detect is 250  copies / mL. A negative result does not preclude SARS-CoV-2 infection  and should not be used as the sole basis for treatment or other  patient management decisions.  A negative result may occur with  improper specimen collection / handling, submission of specimen other  than nasopharyngeal swab, presence of viral mutation(s) within the  areas targeted by this assay, and inadequate number of viral copies  (<250 copies / mL). A negative result must be combined with clinical  observations, patient history, and epidemiological information. If result is POSITIVE SARS-CoV-2 target nucleic acids are DETECTED. The SARS-CoV-2 RNA is generally detectable in upper and lower  respiratory specimens dur ing the acute phase of infection.  Positive  results are indicative of active infection with SARS-CoV-2.  Clinical  correlation with patient history and other diagnostic information is  necessary to determine patient infection status.  Positive results do  not rule out bacterial infection or co-infection with  other viruses. If result is PRESUMPTIVE POSTIVE SARS-CoV-2 nucleic acids MAY BE PRESENT.   A presumptive positive result was obtained on the submitted specimen  and confirmed on repeat testing.  While 2019 novel coronavirus  (SARS-CoV-2) nucleic acids may be present in the submitted sample  additional confirmatory testing may be necessary for epidemiological  and / or clinical management purposes  to  differentiate between  SARS-CoV-2 and other Sarbecovirus currently known to infect humans.  If clinically indicated additional testing with an alternate test  methodology [phone]) is advised. The SARS-CoV-2 RNA is generally  detectable in upper and lower respiratory sp ecimens during the acute  phase of infection. The expected result is Negative. Fact Sheet for Patients:  BoilerBrush.com.cy Fact Sheet for Healthcare Providers: https://pope.com/ This test is not yet approved or cleared by the [place] FDA and has been authorized for detection and/or diagnosis of SARS-CoV-2 by FDA under an Emergency Use Authorization (EUA).  This EUA will remain in effect (meaning this test can be used) for the duration of the COVID-19 declaration under Section 564(b)(1) of the Act, 21 U.S.C. section 360bbb-3(b)(1), unless the authorization is terminated or revoked sooner. Performed at [facility], [address] [place], [place] [address]   Urine rapid drug screen (hosp performed)     Status: Abnormal   Collection Time: 11/06/18  6:20 PM  Result Value Ref Range   Opiates NONE DETECTED NONE DETECTED   Cocaine NONE DETECTED NONE DETECTED   Benzodiazepines POSITIVE (A) NONE DETECTED   Amphetamines NONE DETECTED NONE DETECTED   Tetrahydrocannabinol NONE DETECTED NONE DETECTED   Barbiturates NONE DETECTED NONE DETECTED    Comment: (NOTE) DRUG SCREEN FOR MEDICAL PURPOSES ONLY.  IF CONFIRMATION IS NEEDED FOR ANY PURPOSE, NOTIFY LAB WITHIN 5 DAYS. LOWEST DETECTABLE LIMITS FOR URINE DRUG SCREEN Drug Class                     Cutoff (ng/mL) Amphetamine and metabolites    1000 Barbiturate and metabolites    200 Benzodiazepine                 200 Tricyclics and metabolites     300 Opiates and metabolites        300 Cocaine and metabolites        300 THC                            50 Performed at [facility], [address] [place], [place] [address]   Urinalysis, Routine w reflex microscopic     Status: Abnormal   Collection Time: 11/06/18  6:20 PM  Result Value Ref Range   Color, Urine YELLOW YELLOW   APPearance CLEAR CLEAR   Specific Gravity, Urine >1.030 (H) 1.005 - 1.030   pH 5.5 5.0 - 8.0   Glucose, UA NEGATIVE NEGATIVE mg/dL   Hgb urine dipstick TRACE (A) NEGATIVE   Bilirubin Urine NEGATIVE NEGATIVE   Ketones, ur NEGATIVE NEGATIVE mg/dL   Protein, ur NEGATIVE NEGATIVE mg/dL   Nitrite NEGATIVE NEGATIVE   Leukocytes,Ua NEGATIVE NEGATIVE    Comment: Performed at [facility], [address] [place], [place] [address]  Urinalysis, Microscopic (reflex)     Status: Abnormal   Collection Time: 11/06/18  6:20 PM  Result Value Ref Range   RBC / HPF 0-5 0 - 5 RBC/hpf   WBC, UA 0-5 0 -  5 WBC/hpf   Bacteria, UA RARE (A) NONE SEEN   Squamous Epithelial / LPF 0-5 0 - 5   Mucus PRESENT     Comment: Performed at [facility], [address] [place], [place] [address]  CSF culture     Status: None (Preliminary result)   Collection Time: 11/07/18  3:05 AM   Specimen: CSF; Cerebrospinal Fluid  Result Value Ref Range   Specimen Description CSF    Special Requests TUBE 2    Gram Stain      CYTOSPIN SMEAR RARE WBC PRESENT, PREDOMINANTLY PMN NO ORGANISMS SEEN Performed at [facility], [address] [place], [place] [address]    Culture PENDING    Report Status PENDING     Dg Chest 2 View  Result Date: 11/06/2018 CLINICAL DATA:  Leg weakness, light smoker EXAM: CHEST - 2 VIEW COMPARISON:  None FINDINGS: Normal heart size, mediastinal contours, and pulmonary vascularity. Lungs clear. No pleural effusion or pneumothorax. Bones unremarkable. IMPRESSION: Normal exam.  A Electronically Signed   By: Ulyses SouthwardMark  Boles M.D.   On: 11/06/2018 18:18   Ct Head Wo Contrast  Result Date: 11/06/2018 CLINICAL DATA:  Lower extremity weakness EXAM: CT HEAD WITHOUT CONTRAST TECHNIQUE: Contiguous  axial images were obtained from the base of the skull through the vertex without intravenous contrast. COMPARISON:  MRI 11/04/2018 FINDINGS: Brain: No acute territorial infarction, hemorrhage or intracranial mass. The ventricles are nonenlarged. Vascular: No hyperdense vessel or unexpected calcification. Skull: Normal. Negative for fracture or focal lesion. Sinuses/Orbits: No acute finding. Other: None IMPRESSION: Negative non contrasted CT appearance of the brain. Electronically Signed   By: Jasmine PangKim  Fujinaga M.D.   On: 11/06/2018 17:53   Impression/Plan   53 y.o. male with progressive and debilitating, acute lower back back and bilateral leg pain, R>L. He has obvious RLE weakness. MRI L spine dated 11/04/2018 was reviewed. 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 He also has multilevel foraminal disease most prominent at L3-4 and L4-5. With his history being fairly consistent with neurogenic claudication and his MRI findings as above, this would correlate. He has undergone LP to r/o GBS. A repeat MRI L spine and T spine MRI have been ordered at rec of Neurology. Will await full work up prior to discussing plan of care from a NS perspective.  [facility] and management. - pain control - PT/OT  Cindra PresumeVincent Yenty Bloch, PA-C [facility]

## 2018-11-07 NOTE — Progress Notes (Signed)
Called by ER for opinion on testing and possible consultation on this case.  L-spine imaging, and clinical history as well as exam by nsgy concerning for neurogenic claudication secondary to radiculopathy or spinal stenosis.  LP was recommended in addition to T-spine and L-spine MRI with/without contrast because of report from [facility] ER that the patient had areflexia in lower extremities.  LP with bloody tap-unable to report WBC or RBC in the sample. Protein reporting took a while as sample had to be rediluted multiple times - I question utility of these tests on the clotted csf.  Patient unable to lay flat due to pain for MRI. Likely will need MRI with sedation in the AM. Neurosurgery following  For the CSF results - Glucose is normal. Sample is bloody and clotted - so cell count is unable to be reported. Protein is reported at >600 and correction for the cells can not be made due to counts not being able to be performed (presumably would be high as the sample was clotted). This is unlikely to be of use to make or rule out diagnosis of GBS.  Recs: Obtain T and L-spine MRI with and without contrast. In most cases of GBS, there will be nerve root enhancement  - that will act as supporting evidence and might guide towards IVIG treatment. In the absence of enhancement, the clinical picture/history and non-contrast imaging of L-spine is more concerning for lumbar spinal stenosis and radiculopathy, which is essentially in the neurosurgical realm for management.  I have discussed the case with Dr. Leonides Schanz in ER. He has been admited to Medicine service. I will sign out to my day team to follow up on imaging and provide further input as needed.  -- Amie Portland, MD [facility] Pager: [phone] If 7pm to 7am, please call on call as listed on AMION.

## 2018-11-07 NOTE — Consult Note (Signed)
NEURO HOSPITALIST CONSULT NOTE   Requesting physician: Dr. Tyson Babinski  Reason for Consult: BLE weakness with pain  History obtained from:   Patient and Chart    HPI:                                                                                                                                          Joseph Reeves is an 53 y.o. male presenting with a 5 day history of severe LBP occurring while at a class here in [place]. 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 He was traveling to [place] for his work. The LPB radiates down the backs and sides of his legs bilaterally, significantly worse on the right. His LBP is exacerbated by movement and certain positions. Along with the back pain, he has had BLE weakness, right worse than left.   He denies saddle anesthesia or loss of bowel/bladder control.  He had one prior episode of severe LBP with lower extremity weakness involving his RLE approximately 8 years ago.   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 Also noted is multilevel foraminal stenosis, severe on the right at L3-4 and bilaterally at L4-5.  LP was a bloody tap with uninterpretable results due to excess RBCs.   Past Medical History:  Diagnosis Date  . Sciatica     History reviewed. No pertinent surgical history.  Family History  Problem Relation Age of Onset  . Hypertension Father   . Heart failure Father               Social History:  reports that he has been smoking cigars. He has never used smokeless tobacco. He reports current alcohol use. He reports that he does not use drugs.  No Known Allergies  MEDICATIONS:                                                                                                                     Valium Meloxicam Percocet  Prednisone   ROS:  No fever, dysuria, rectal bleeding, N/V, headache, vision changes or mental status changes. Other symptoms as per HPI with comprehensive ROS otherwise negative.    Blood pressure 119/65, pulse (!) 51, temperature 98.5 F (36.9 C), temperature source Oral, resp. rate 18, height 6\' 1"  (1.854 m), weight 104.3 kg, SpO2 97 %.   General Examination:                                                                                                       Physical Exam  HEENT-  [place]/AT    Lungs- Respirations unlabored Extremities- No edema  Neurological Examination Mental Status: Alert, oriented, thought content appropriate.  Speech fluent without evidence of aphasia.  Able to follow all commands without difficulty. Cranial Nerves: II: Visual fields intact with no extinction to DSS. PERRL III,IV, VI: No ptosis. EOMI.  V,VII: Smile symmetric, facial temp sensation equal bilaterally VIII: hearing intact to conversation IX,X: No hypophonia XI: Symmetric XII: midline tongue extension Motor: BUE 5/5 proximal and distal RLE 3/5 hip flexion, knee flexion, knee extension, ADF/APF. LLE: 4-/5 hip flexion, knee flexion, knee extension. 4/5 APF/ADF Sensory: Decreased FT sensation to dorsum and plantar aspect of right foot, worse than decreased FT sensation to RLE below knee. Some decreased FT to medial right thigh. Mildly decreased FT sensation to LLE below knee in circumferential distribution. Temp sensation equal in all 4 extremities proximally and distally without dermatomal distribution temperature sensory loss.   Deep Tendon Reflexes: 2+ bilateral brachioradialis and biceps. 0 bilateral patellae and achilles. Toes are mute.  Cerebellar: No ataxia with FNF bilaterally  Gait: Deferred   Lab Results: Basic Metabolic Panel: Recent Labs  Lab 11/06/18 1651 11/07/18 0554  NA 141 143  K  3.7 3.9  CL 102 109  CO2 27 21*  GLUCOSE 130* 111*  BUN 27* 25*  CREATININE 1.11 1.11  CALCIUM 9.1 9.0    CBC: Recent Labs  Lab 11/06/18 1651 11/07/18 0554  WBC 13.5* 9.2  NEUTROABS 11.2*  --   HGB 12.7* 13.4  HCT 39.4 43.7  MCV 97.8 108.4*  PLT 261 236    Cardiac Enzymes: Recent Labs  Lab 11/07/18 0554  CKTOTAL 623*  CKMB 3.6    Lipid Panel: No results for input(s): CHOL, TRIG, HDL, CHOLHDL, VLDL, LDLCALC in the last 168 hours.  Imaging: Dg Chest 2 View  Result Date: 11/06/2018 CLINICAL DATA:  Leg weakness, light smoker EXAM: CHEST - 2 VIEW COMPARISON:  None FINDINGS: Normal heart size, mediastinal contours, and pulmonary vascularity. Lungs clear. No pleural effusion or pneumothorax. Bones unremarkable. IMPRESSION: Normal exam.  A Electronically Signed   By: Ulyses SouthwardMark  Boles M.D.   On: 11/06/2018 18:18   Ct Head Wo Contrast  Result Date: 11/06/2018 CLINICAL DATA:  Lower extremity weakness EXAM: CT HEAD WITHOUT CONTRAST TECHNIQUE: Contiguous axial images were obtained from the base of the skull through the vertex without intravenous contrast. COMPARISON:  MRI 11/04/2018 FINDINGS: Brain: No acute territorial infarction, hemorrhage or intracranial mass. The ventricles are nonenlarged. Vascular: No hyperdense vessel or unexpected calcification. Skull: Normal. Negative  for fracture or focal lesion. Sinuses/Orbits: No acute finding. Other: None IMPRESSION: Negative non contrasted CT appearance of the brain. Electronically Signed   By: Donavan Foil M.D.   On: 11/06/2018 17:53    Assessment: 53 year old male with acute onset of LBP in conjunction with right worse than left lower extremity weakness 1. Based on exam findings and lumbar spine MRI the patient's acute presentation is felt most likely to be secondary to severe lumbar spinal stenosis with compression of the cauda equina at L2-L3.  2. Areflexia of BLE is most likely due to nerve root compression rather than GBS. Upper  extremity reflexes are normal. 3. CSF sample obtained cannot be used to obtain meaningful diagnostic information based on bloody tap.   Recommendations: 1. MRI of thoracic spine with and without contrast is pending to rule out transverse myelitis.  2. MRI of L-spine add on with contrast has been ordered to assess for possible nerve root enhancement.  3. Neurosurgery is consulting as well.   Electronically signed: Dr. Kerney Elbe 11/07/2018, 8:56 AM

## 2018-11-07 NOTE — Transfer of Care (Signed)
Immediate Anesthesia Transfer of Care Note  Patient: Joseph Reeves  Procedure(s) Performed: MRI WITH ANESTHESIA (N/A )  Patient Location: PACU  Anesthesia Type:General  Level of Consciousness: awake, oriented and patient cooperative  Airway & Oxygen Therapy: Patient Spontanous Breathing and Patient connected to face mask oxygen  Post-op Assessment: Report given to RN and Post -op Vital signs reviewed and stable  Post vital signs: Reviewed and stable  Last Vitals:  Vitals Value Taken Time  BP    Temp    Pulse    Resp    SpO2      Last Pain:  Vitals:   11/07/18 0739  TempSrc:   PainSc: Asleep         Complications: No apparent anesthesia complications

## 2018-11-08 ENCOUNTER — Encounter (HOSPITAL_COMMUNITY): Payer: Self-pay | Admitting: Radiology

## 2018-11-08 LAB — VITAMIN B1

## 2018-11-08 LAB — PROTEIN ELECTROPHORESIS, SERUM
A/G Ratio: 1.1 (ref 0.7–1.7)
Albumin ELP: 3.6 g/dL (ref 2.9–4.4)
Alpha-1-Globulin: 0.3 g/dL (ref 0.0–0.4)
Alpha-2-Globulin: 1.1 g/dL — ABNORMAL HIGH (ref 0.4–1.0)
Beta Globulin: 0.9 g/dL (ref 0.7–1.3)
Gamma Globulin: 1 g/dL (ref 0.4–1.8)
Globulin, Total: 3.3 g/dL (ref 2.2–3.9)
M-Spike, %: 0.6 g/dL — ABNORMAL HIGH
Total Protein ELP: 6.9 g/dL (ref 6.0–8.5)

## 2018-11-08 LAB — COMPREHENSIVE METABOLIC PANEL
ALT: 18 U/L (ref 0–44)
AST: 16 U/L (ref 15–41)
Albumin: 3.4 g/dL — ABNORMAL LOW (ref 3.5–5.0)
Alkaline Phosphatase: 31 U/L — ABNORMAL LOW (ref 38–126)
Anion gap: 8 (ref 5–15)
BUN: 22 mg/dL — ABNORMAL HIGH (ref 6–20)
CO2: 26 mmol/L (ref 22–32)
Calcium: 8.6 mg/dL — ABNORMAL LOW (ref 8.9–10.3)
Chloride: 106 mmol/L (ref 98–111)
Creatinine, Ser: 1.08 mg/dL (ref 0.61–1.24)
GFR calc Af Amer: >60 mL/min (ref >60)
GFR calc non Af Amer: >60 mL/min (ref >60)
Glucose, Bld: 128 mg/dL — ABNORMAL HIGH (ref 70–99)
Potassium: 4 mmol/L (ref 3.5–5.1)
Sodium: 140 mmol/L (ref 135–145)
Total Bilirubin: 0.9 mg/dL (ref 0.3–1.2)
Total Protein: 6 g/dL — ABNORMAL LOW (ref 6.5–8.1)

## 2018-11-08 LAB — CBC
HCT: 35.2 % — ABNORMAL LOW (ref 39.0–52.0)
Hemoglobin: 11.5 g/dL — ABNORMAL LOW (ref 13.0–17.0)
MCH: 32 pg (ref 26.0–34.0)
MCHC: 32.7 g/dL (ref 30.0–36.0)
MCV: 98.1 fL (ref 80.0–100.0)
Platelets: 207 10*3/uL (ref 150–400)
RBC: 3.59 MIL/uL — ABNORMAL LOW (ref 4.22–5.81)
RDW: 12.2 % (ref 11.5–15.5)
WBC: 8.7 10*3/uL (ref 4.0–10.5)
nRBC: 0 % (ref 0.0–0.2)

## 2018-11-08 LAB — FOLATE RBC
Folate, Hemolysate: 271 ng/mL
Folate, RBC: 711 ng/mL (ref >498)
Hematocrit: 38.1 % (ref 37.5–51.0)

## 2018-11-08 LAB — ANA: Anti Nuclear Antibody (ANA): NEGATIVE

## 2018-11-08 LAB — ABO/RH: ABO/RH(D): A POS

## 2018-11-08 LAB — TYPE AND SCREEN
ABO/RH(D): A POS
Antibody Screen: NEGATIVE

## 2018-11-08 LAB — VDRL, CSF: VDRL Quant, CSF: NONREACTIVE

## 2018-11-08 MED ORDER — ENOXAPARIN SODIUM 60 MG/0.6ML ~~LOC~~ SOLN
0.5000 mg/kg | SUBCUTANEOUS | Status: DC
  Administered 2018-11-08: 55 mg via SUBCUTANEOUS
  Filled 2018-11-08: qty 0.55

## 2018-11-08 MED ORDER — CHLORHEXIDINE GLUCONATE CLOTH 2 % EX PADS
6.0000 | MEDICATED_PAD | Freq: Every day | CUTANEOUS | Status: DC
  Administered 2018-11-10 – 2018-11-13 (×3): 6 via TOPICAL

## 2018-11-08 NOTE — Progress Notes (Signed)
Rehab Admissions Coordinator Note:  Patient was screened by Cleatrice Burke for appropriateness for an Inpatient Acute Rehab Consult per PT recs. Noted plans for surgery tomorrow per Dr. Rita Ohara.  At this time, we are recommending await to see how pt progresses postoperatively to assist with planning rehab venue needs,.  Cleatrice Burke RN MSN 11/08/2018, 7:54 PM  I can be reached at [phone].

## 2018-11-08 NOTE — Progress Notes (Signed)
Subjective: Patient seen today with his sister Joseph Reeves [phone]) at the bedside.  He is sitting up on the side of the bed, clearly less disablingly comfortable then when seen yesterday morning.  However despite having had a very lengthy discussion with the patient and his sister today morning, he does not recall having met me or any of our discussion.  However his sister recalled all of the details of our conversation yesterday.  Patient is able to much more clearly today described his symptoms.  He describes numbness in his buttocks bilaterally as well as in his legs and feet.  The numbness in the right leg and foot is worse than the numbness in the left leg and foot.  He also describes weakness in the lower extremities, right worse than left.  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  While he was driving to [place] he began to have discomfort, and eventually increasing pain.  Last week he was in class and began to have increasing difficulty getting around because of pain, weakness, and numbness.  Since I saw him yesterday, he has undergone MRI scans of the brain and thoracic spine, and a repeat MRI of the lumbar spine.  The MRI of the brain was unremarkable.  The MRI of the thoracic spine shows a shallow right T7-8 disc herniation without spinal cord compression.  The MRI of the lumbar spine was compared to the study done on October 17.  The overall quality of yesterday's MRI was better, and the nature and extent of pathology was better visualized.  The MRI again shows evidence of congenital lumbar spinal stenosis with superimposed degenerative disc disease and spondylosis particularly at the L1-2, L2-3, L3-4, and L4-5 levels.  However there is severe stenosis at the L2-3 level, contributed to by his underlying congenital spinal stenosis, but most significantly by a large L2-3 disc  herniation that extends caudally behind the body of L3.  The patient was started on Lovenox injections today, and received his first dose at 1530.  Patient does not describe voiding difficulties, and has been voiding.  However I was concerned about the possibility of neurogenic urinary retention, and asked the staff to perform a bladder scan which showed a large dilated bladder (that is greater than 999 cc).  Requested placement of a Foley catheter to straight drainage.  I asked the staff that if the initial output is greater than 800 cc, to clamp it for 15 minutes, and then reopen it.  Objective: Vital signs in last 24 hours: Vitals:   11/08/18 0428 11/08/18 0948 11/08/18 1258 11/08/18 1706  BP: 130/69 (!) 146/84 133/64 132/84  Pulse: (!) 56 66 (!) 59 70  Resp: _0 Temp: 98 F (36.7 C) 98.6 F (37 C) 98.7 F (37.1 C) 98.6 F (37 C)  TempSrc:  Oral Oral Oral  SpO2: 100% 100% 98% 99%  Weight: 108.5 kg     Height:        Intake/Output from previous day: 10/20 0701 - 10/21 0700 In: 600 [I.V.:600] Out: -  Intake/Output this shift: Total I/O In: 265.3 [P.O.:200; I.V.:65.3] Out: -   Physical Exam: Patient is a well-developed well-nourished black male in no acute distress.  Motor examination shows the left iliopsoas and quadriceps are 5, the right iliopsoas and quadriceps are 3-4 minus.  He has no movement of the dorsiflexors  and plantar flexors consistent with 0/5.  CBC Recent Labs    11/07/18 0554 11/07/18 1854 11/08/18 0611  WBC 9.2  --  8.7  HGB 13.4  --  11.5*  HCT 43.7 38.1 35.2*  PLT 236  --  207   BMET Recent Labs    11/07/18 0554 11/08/18 0402  NA 143 140  K 3.9 4.0  CL 109 106  CO2 21* 26  GLUCOSE 111* 128*  BUN 25* 22*  CREATININE 1.11 1.08  CALCIUM 9.0 8.6*   Studies/Results: Mr Jeri Cos BJ Contrast  Result Date: 11/07/2018 CLINICAL DATA:  Bilateral lower extremity weakness. EXAM: MRI HEAD WITHOUT AND WITH CONTRAST TECHNIQUE: Multiplanar,  multiecho pulse sequences of the brain and surrounding structures were obtained without and with intravenous contrast. CONTRAST:  56m GADAVIST GADOBUTROL 1 MMOL/ML IV SOLN COMPARISON:  Head CT yesterday FINDINGS: Brain: The brain has a normal appearance without evidence of malformation, atrophy, old or acute small or large vessel infarction, mass lesion, hemorrhage, hydrocephalus or extra-axial collection. Vascular: Major vessels at the base of the brain show flow. Venous sinuses appear patent. Skull and upper cervical spine: Normal. Sinuses/Orbits: Mucosal thickening of the right maxillary sinus. Other sinuses clear. Other: Fluid in the posterior nasopharynx, reason not certain. IMPRESSION: Normal brain MRI. No abnormality seen to explain the clinical presentation. Electronically Signed   By: MNelson ChimesM.D.   On: 11/07/2018 17:40   Mr Thoracic Spine W Wo Contrast  Result Date: 11/07/2018 CLINICAL DATA:  Numbness, tingling and paresthesia of the extremities. EXAM: MRI THORACIC WITHOUT AND WITH CONTRAST TECHNIQUE: Multiplanar and multiecho pulse sequences of the thoracic spine were obtained without and with intravenous contrast. CONTRAST:  150mGADAVIST GADOBUTROL 1 MMOL/ML IV SOLN COMPARISON:  None. FINDINGS: MRI THORACIC SPINE FINDINGS Alignment:  Normal Vertebrae: Normal Cord:  No primary cord lesion.  See below regarding stenosis. Paraspinal and other soft tissues: Negative Disc levels: No abnormality from T1-2 through T4-5. T5-6: Minor left-sided disc bulge.  No compressive stenosis. T6-7: Normal. T7-8: Central to slightly right-sided disc herniation, narrowing the ventral subarachnoid space but not compressing the cord. No compressive foraminal narrowing. T8-9: Normal interspace. T9-10: Minor left-sided disc bulge.  No compressive stenosis. T10-11: Right paracentral disc bulge.  No compressive stenosis. T11-12 and T12-L1: Normal. IMPRESSION: Degenerative changes as outlined above. The only potentially  symptomatic finding is at T7-8 where there is a central to right-sided disc herniation. This narrows the ventral subarachnoid space and contacts the ventral cord, but ample subarachnoid spaces present dorsal to the cord in there does not appear to be any cord compression or abnormal cord signal. None the less, this finding could be symptomatic. Electronically Signed   By: MaNelson Chimes.D.   On: 11/07/2018 17:45   Mr Lumbar Spine W Wo Contrast  Result Date: 11/07/2018 CLINICAL DATA:  Lower extremity numbness. EXAM: MRI LUMBAR SPINE WITHOUT AND WITH CONTRAST TECHNIQUE: Multiplanar and multiecho pulse sequences of the lumbar spine were obtained without and with intravenous contrast. CONTRAST:  1066mADAVIST GADOBUTROL 1 MMOL/ML IV SOLN COMPARISON:  11/04/2018 FINDINGS: Segmentation:  5 lumbar type vertebral bodies. Alignment:  Straightening of the normal lumbar lordosis. Vertebrae:  No fracture or primary bone lesion. Conus medullaris and cauda equina: Conus extends to the T12-L1 level. Conus and cauda equina appear normal. Paraspinal and other soft tissues: Distended bladder Disc levels: L1-2: Circumferential bulging of the disc. Mild stenosis without visible neural compression. L2-3: Broad-based disc herniation with caudal migration behind upper L3.  Severe spinal stenosis with effacement of the subarachnoid space and likely nerve compression. L3-4: Bulging of the disc. Mild stenosis of both lateral recesses and foramina without definite neural compression. L4-5: Bulging of the disc more towards the left. Mild stenosis of both lateral recesses. Left foraminal narrowing with some potential for neural compression. L5-S1: Disc bulge. Mild facet hypertrophy. No compressive stenosis. 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 This is quite likely the significant lesion in this case. Disc  bulges at L1-2, L3-4 and L4-5 as noted above with lateral recess and foraminal stenoses but no definite neural compression. Electronically Signed   By: Nelson Chimes M.D.   On: 11/07/2018 17:51    Assessment/Plan: Patient who presented with disabling neurogenic claudication, but whose overall clinical picture was indistinct.  Today he is able to present to much clearer history and examination, and has evidence of cauda equina syndrome with significant weakness in the lower extremities, urinary retention, and numbness in the buttocks and distal lower extremities.  I reviewed the patient's MRI with he and his sister, reviewing the images on the computer in his room.  I have recommended surgical intervention for decompression and stabilization of the lumbar spine at the L2-3 level.  Specifically I have recommended a L2-3 decompression including laminectomy, facetectomies, foraminotomies, and discectomy, and L2-3 stabilization via a posterior lumbar interbody arthrodesis with interbody implants and bone graft and a posterior lateral arthrodesis with posterior instrumentation and bone graft.  I discussed with the patient and his sister the nature of his condition, the nature the surgical procedure, the typical length of surgery, hospital stay, and overall recuperation, his limitations postoperatively, the uncertainty of neurologic recovery, and risks of surgery. I discussed with them the risks of surgery including risks of infection, bleeding, possibly need for transfusion, the risk of nerve root dysfunction with pain, weakness, numbness, or paresthesias, the risk of dural tear and CSF leakage and possible need for further surgery, the risk of failure of the arthrodesis and possibly for further surgery, the risk of anesthetic complications including myocardial infarction, stroke, pneumonia, and death.    We discussed that he could require post operative rehabilitation, and certainly PT and OT.  We discussed the  likelihood that he could require further surgery at adjacent levels in the lumbar spine due to his congenital lumbar stenosis and underlying lumbar spondylosis and degenerative disc disease.  We discussed the need for postoperative immobilization in a lumbar brace.   Understanding all of this the patient does wish to proceed with surgery and we have discontinued his Lovenox, and schedule surgery for tomorrow.  I have placed an order for a lumbar Aspen corset to use postoperatively.  Hosie Spangle, MD 11/08/2018, 6:47 PM

## 2018-11-08 NOTE — Progress Notes (Signed)
PROGRESS NOTE    Joseph PoliceChristopher Reeves  YNW:[id number] DOB: 09/12/1965 DOA: 11/06/2018 PCP: Patient, No Pcp Per     Brief Narrative:  Joseph PoliceChristopher Reeves is a 53 year old male who lives in [place] and visiting [place] due to work presents with numbness and tingling of bilateral legs since Monday.  Symptoms have progressed to include weakness and significantly getting worse.  He has been evaluated by emergency department 3 times in the past week.  Patient underwent lumbar puncture which resulted in a bloody tap.  He also underwent MRI of thoracic and lumbar spine.  Neurosurgery and neurology consulted.  New events last 24 hours / Subjective: Continues to have lower extremity weakness, unable to get out of bed.  Wondering if condom catheter can be removed in view of using a hand-held urinal.  Assessment & Plan:   Principal Problem:   Leg weakness Active Problems:   Numbness and tingling of both legs   Numbness, tingling, weakness of the bilateral lower extremities secondary to L2-L3 disc herniation -Neurology evaluated patient during hospitalization and has recommended further neurosurgery evaluation. -Awaiting neurosurgery recommendations for surgery.  Patient and family are both in favor of having the surgery done prior to discharge home.    DVT prophylaxis: SCD, lovenox  Code Status: Full code Family Communication: Discussed with sister over the phone Disposition Plan: Pending neurosurgery recommendation   Consultants:   Neurology  Neurosurgery  Procedures:   None  Antimicrobials:  Anti-infectives (From admission, onward)   None        Objective: Vitals:   11/07/18 2320 11/08/18 0428 11/08/18 0948 11/08/18 1258  BP: 127/60 130/69 (!) 146/84 133/64  Pulse: 62 (!) 56 66 (!) 59  Resp: 14 16 20 16   Temp: 98.8 F (37.1 C) 98 F (36.7 C) 98.6 F (37 C) 98.7 F (37.1 C)  TempSrc: Oral  Oral Oral  SpO2: 96% 100% 100% 98%  Weight:  108.5 kg    Height:         Intake/Output Summary (Last 24 hours) at 11/08/2018 1314 Last data filed at 11/07/2018 1705 Gross per 24 hour  Intake 600 ml  Output --  Net 600 ml   Filed Weights   11/06/18 1405 11/08/18 0428  Weight: 104.3 kg 108.5 kg    Examination:  General exam: Appears calm and comfortable  Respiratory system: Clear to auscultation. Respiratory effort normal. No respiratory distress. No conversational dyspnea.  Cardiovascular system: S1 & S2 heard, RRR. No murmurs. No pedal edema. Gastrointestinal system: Abdomen is nondistended, soft and nontender. Normal bowel sounds heard. Central nervous system: Alert and oriented. Speech clear.  Extremities: Symmetric in appearance, bilateral lower extremity weakness Skin: No rashes, lesions or ulcers on exposed skin  Psychiatry: Judgement and insight appear normal. Mood & affect appropriate.   Data Reviewed: I have personally reviewed following labs and imaging studies  CBC: Recent Labs  Lab 11/06/18 1651 11/07/18 0554 11/08/18 0611  WBC 13.5* 9.2 8.7  NEUTROABS 11.2*  --   --   HGB 12.7* 13.4 11.5*  HCT 39.4 43.7 35.2*  MCV 97.8 108.4* 98.1  PLT 261 236 207   Basic Metabolic Panel: Recent Labs  Lab 11/06/18 1651 11/07/18 0554 11/08/18 0402  NA 141 143 140  K 3.7 3.9 4.0  CL 102 109 106  CO2 27 21* 26  GLUCOSE 130* 111* 128*  BUN 27* 25* 22*  CREATININE 1.11 1.11 1.08  CALCIUM 9.1 9.0 8.6*   GFR: Estimated Creatinine Clearance: 103.3 mL/min (by C-G  formula based on SCr of 1.08 mg/dL). Liver Function Tests: Recent Labs  Lab 11/06/18 1651 11/07/18 0554 11/08/18 0402  AST ALT ALKPHOS 35* 29* 31*  BILITOT 0.5 QUANTITY NOT SUFFICIENT, UNABLE TO PERFORM TEST 0.9  PROT 7.7 6.7 6.0*  ALBUMIN 4.4 3.8 3.4*   Recent Labs  Lab 11/06/18 1651  LIPASE 22   No results for input(s): AMMONIA in the last 168 hours. Coagulation Profile: No results for input(s): INR, PROTIME in the last 168 hours. Cardiac  Enzymes: Recent Labs  Lab 11/07/18 0554  CKTOTAL 623*  CKMB 3.6   BNP (last 3 results) No results for input(s): PROBNP in the last 8760 hours. HbA1C: No results for input(s): HGBA1C in the last 72 hours. CBG: No results for input(s): GLUCAP in the last 168 hours. Lipid Profile: No results for input(s): CHOL, HDL, LDLCALC, TRIG, CHOLHDL, LDLDIRECT in the last 72 hours. Thyroid Function Tests: Recent Labs    11/07/18 0554  TSH 1.577   Anemia Panel: Recent Labs    11/07/18 0600  VITAMINB12 270  FERRITIN 258  TIBC 304  IRON 179   Sepsis Labs: No results for input(s): PROCALCITON, LATICACIDVEN in the last 168 hours.  Recent Results (from the past 240 hour(s))  SARS Coronavirus 2 by RT PCR (hospital order, performed in [facility] hospital lab) Nasopharyngeal Nasopharyngeal Swab     Status: None   Collection Time: 11/06/18  6:01 PM   Specimen: Nasopharyngeal Swab  Result Value Ref Range Status   SARS Coronavirus 2 NEGATIVE NEGATIVE Final    Comment: (NOTE) If result is NEGATIVE SARS-CoV-2 target nucleic acids are NOT DETECTED. The SARS-CoV-2 RNA is generally detectable in upper and lower  respiratory specimens during the acute phase of infection. The lowest  concentration of SARS-CoV-2 viral copies this assay can detect is 250  copies / mL. A negative result does not preclude SARS-CoV-2 infection  and should not be used as the sole basis for treatment or other  patient management decisions.  A negative result may occur with  improper specimen collection / handling, submission of specimen other  than nasopharyngeal swab, presence of viral mutation(s) within the  areas targeted by this assay, and inadequate number of viral copies  (<250 copies / mL). A negative result must be combined with clinical  observations, patient history, and epidemiological information. If result is POSITIVE SARS-CoV-2 target nucleic acids are DETECTED. The SARS-CoV-2 RNA is generally  detectable in upper and lower  respiratory specimens dur ing the acute phase of infection.  Positive  results are indicative of active infection with SARS-CoV-2.  Clinical  correlation with patient history and other diagnostic information is  necessary to determine patient infection status.  Positive results do  not rule out bacterial infection or co-infection with other viruses. If result is PRESUMPTIVE POSTIVE SARS-CoV-2 nucleic acids MAY BE PRESENT.   A presumptive positive result was obtained on the submitted specimen  and confirmed on repeat testing.  While 2019 novel coronavirus  (SARS-CoV-2) nucleic acids may be present in the submitted sample  additional confirmatory testing may be necessary for epidemiological  and / or clinical management purposes  to differentiate between  SARS-CoV-2 and other Sarbecovirus currently known to infect humans.  If clinically indicated additional testing with an alternate test  methodology [phone]) is advised. The SARS-CoV-2 RNA is generally  detectable in upper and lower respiratory sp ecimens during the acute  phase of infection. The expected result  is Negative. Fact Sheet for Patients:  BoilerBrush.com.cy Fact Sheet for Healthcare Providers: https://pope.com/ This test is not yet approved or cleared by the [place] FDA and has been authorized for detection and/or diagnosis of SARS-CoV-2 by FDA under an Emergency Use Authorization (EUA).  This EUA will remain in effect (meaning this test can be used) for the duration of the COVID-19 declaration under Section 564(b)(1) of the Act, 21 U.S.C. section 360bbb-3(b)(1), unless the authorization is terminated or revoked sooner. Performed at [facility], [address] [place], [place] [address]   CSF culture     Status: None (Preliminary result)   Collection Time: 11/07/18  3:05 AM   Specimen: CSF; Cerebrospinal Fluid  Result Value  Ref Range Status   Specimen Description CSF  Final   Special Requests TUBE 2  Final   Gram Stain   Final    CYTOSPIN SMEAR RARE WBC PRESENT, PREDOMINANTLY PMN NO ORGANISMS SEEN    Culture   Final    NO GROWTH 1 DAY Performed at [facility], [address] [place], [place] [address]    Report Status PENDING  Incomplete      Radiology Studies: Dg Chest 2 View  Result Date: 11/06/2018 CLINICAL DATA:  Leg weakness, light smoker EXAM: CHEST - 2 VIEW COMPARISON:  None FINDINGS: Normal heart size, mediastinal contours, and pulmonary vascularity. Lungs clear. No pleural effusion or pneumothorax. Bones unremarkable. IMPRESSION: Normal exam.  A Electronically Signed   By: Ulyses Southward M.D.   On: 11/06/2018 18:18   Ct Head Wo Contrast  Result Date: 11/06/2018 CLINICAL DATA:  Lower extremity weakness EXAM: CT HEAD WITHOUT CONTRAST TECHNIQUE: Contiguous axial images were obtained from the base of the skull through the vertex without intravenous contrast. COMPARISON:  MRI 11/04/2018 FINDINGS: Brain: No acute territorial infarction, hemorrhage or intracranial mass. The ventricles are nonenlarged. Vascular: No hyperdense vessel or unexpected calcification. Skull: Normal. Negative for fracture or focal lesion. Sinuses/Orbits: No acute finding. Other: None IMPRESSION: Negative non contrasted CT appearance of the brain. Electronically Signed   By: Jasmine Pang M.D.   On: 11/06/2018 17:53   Mr Laqueta Jean XA Contrast  Result Date: 11/07/2018 CLINICAL DATA:  Bilateral lower extremity weakness. EXAM: MRI HEAD WITHOUT AND WITH CONTRAST TECHNIQUE: Multiplanar, multiecho pulse sequences of the brain and surrounding structures were obtained without and with intravenous contrast. CONTRAST:  49mL GADAVIST GADOBUTROL 1 MMOL/ML IV SOLN COMPARISON:  Head CT yesterday FINDINGS: Brain: The brain has a normal appearance without evidence of malformation, atrophy, old or acute small or large vessel infarction, mass  lesion, hemorrhage, hydrocephalus or extra-axial collection. Vascular: Major vessels at the base of the brain show flow. Venous sinuses appear patent. Skull and upper cervical spine: Normal. Sinuses/Orbits: Mucosal thickening of the right maxillary sinus. Other sinuses clear. Other: Fluid in the posterior nasopharynx, reason not certain. IMPRESSION: Normal brain MRI. No abnormality seen to explain the clinical presentation. Electronically Signed   By: Paulina Fusi M.D.   On: 11/07/2018 17:40   Mr Thoracic Spine W Wo Contrast  Result Date: 11/07/2018 CLINICAL DATA:  Numbness, tingling and paresthesia of the extremities. EXAM: MRI THORACIC WITHOUT AND WITH CONTRAST TECHNIQUE: Multiplanar and multiecho pulse sequences of the thoracic spine were obtained without and with intravenous contrast. CONTRAST:  40mL GADAVIST GADOBUTROL 1 MMOL/ML IV SOLN COMPARISON:  None. FINDINGS: MRI THORACIC SPINE FINDINGS Alignment:  Normal Vertebrae: Normal Cord:  No primary cord lesion.  See below regarding stenosis.  Paraspinal and other soft tissues: Negative Disc levels: No abnormality from T1-2 through T4-5. T5-6: Minor left-sided disc bulge.  No compressive stenosis. T6-7: Normal. T7-8: Central to slightly right-sided disc herniation, narrowing the ventral subarachnoid space but not compressing the cord. No compressive foraminal narrowing. T8-9: Normal interspace. T9-10: Minor left-sided disc bulge.  No compressive stenosis. T10-11: Right paracentral disc bulge.  No compressive stenosis. T11-12 and T12-L1: Normal. IMPRESSION: Degenerative changes as outlined above. The only potentially symptomatic finding is at T7-8 where there is a central to right-sided disc herniation. This narrows the ventral subarachnoid space and contacts the ventral cord, but ample subarachnoid spaces present dorsal to the cord in there does not appear to be any cord compression or abnormal cord signal. None the less, this finding could be symptomatic.  Electronically Signed   By: Nelson Chimes M.D.   On: 11/07/2018 17:45   Mr Lumbar Spine W Wo Contrast  Result Date: 11/07/2018 CLINICAL DATA:  Lower extremity numbness. EXAM: MRI LUMBAR SPINE WITHOUT AND WITH CONTRAST TECHNIQUE: Multiplanar and multiecho pulse sequences of the lumbar spine were obtained without and with intravenous contrast. CONTRAST:  43mL GADAVIST GADOBUTROL 1 MMOL/ML IV SOLN COMPARISON:  11/04/2018 FINDINGS: Segmentation:  5 lumbar type vertebral bodies. Alignment:  Straightening of the normal lumbar lordosis. Vertebrae:  No fracture or primary bone lesion. Conus medullaris and cauda equina: Conus extends to the T12-L1 level. Conus and cauda equina appear normal. Paraspinal and other soft tissues: Distended bladder Disc levels: L1-2: Circumferential bulging of the disc. Mild stenosis without visible neural compression. L2-3: Broad-based disc herniation with caudal migration behind upper L3. Severe spinal stenosis with effacement of the subarachnoid space and likely nerve compression. L3-4: Bulging of the disc. Mild stenosis of both lateral recesses and foramina without definite neural compression. L4-5: Bulging of the disc more towards the left. Mild stenosis of both lateral recesses. Left foraminal narrowing with some potential for neural compression. L5-S1: Disc bulge. Mild facet hypertrophy. No compressive stenosis. 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 This is quite likely the significant lesion in this case. Disc bulges at L1-2, L3-4 and L4-5 as noted above with lateral recess and foraminal stenoses but no definite neural compression. Electronically Signed   By: Nelson Chimes M.D.   On: 11/07/2018 17:51      Scheduled Meds:   HYDROmorphone (DILAUDID) injection  1 mg Intravenous Once   meloxicam  15 mg Oral Daily   Continuous Infusions:  lactated  ringers Stopped (11/08/18 1154)     LOS: 0 days      Time spent: 40 minutes   Dessa Phi, DO [facility] 11/08/2018, 1:14 PM   Available via Epic secure chat 7am-7pm After these hours, please refer to coverage provider listed on [email or web address]

## 2018-11-08 NOTE — Progress Notes (Signed)
MRI brain was negative.   T-spine MRI with disc herniation at T7-8 contacting the cord anteriorly, but CSF spaces posterior to the cord are patent. No transverse myelitis is seen.   Post-contrast L-spine MRI further defines the characteristics of the L2-3 large broad-based disc herniation. Notably, there is a large caudally migrated fragment behind L3 compressing the thecal sac and likely to cause neural compression. This is quite likely the significant lesion in this case.  A/R: 53 year old male with LBP radiating down BLE with associated weakness and sensory symptoms, most likely secondary to neurogenic claudication from large disc herniation at L2-3.  1. Would call Neurosurgery to inform them of completed MRI scans and to assess indications for decompressive discectomy or laminectomy.  2. No transverse myelitis on T-spine MRI. MRI brain is negative.  3. Neurology will sign off. Please call if there are additional questions.   Electronically signed: Dr. Kerney Elbe

## 2018-11-08 NOTE — Evaluation (Addendum)
Physical Therapy Evaluation Patient Details Name: Joseph Reeves MRN: [id number] DOB: Apr 30, 1965 Today's Date: 11/08/2018   History of Present Illness  Pt is a 53 y.o. M with no significant PMH of presents with LBP radiating down BLE with associated weakness and sensory symptoms. MRI brain negative. No transverse myelitis on T-spine MRI. 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   Clinical Impression  Pt admitted with above. Pt presents with decreased functional mobility secondary to decreased right lower extremity proprioception and significant bilateral lower extremity weakness (particularly distally; bilateral ankle dorsiflexion 0/5). In comparison, right lower extremity is weaker than left. Pt reporting numbness/tingling, however intact to light touch upon assessment. Able to perform bed mobility without physical assist, however, unable to stand with max assist +1. Will trial Stedy next session. Prior to admission, [place] lives in [place], is active, and is a Administrator. He is very motivated and will benefit from CIR to address deficits and maximize functional independence. Suspect good progress based on age, PLOF, and family support.     Follow Up Recommendations CIR    Equipment Recommendations  Wheelchair (measurements PT);3in1 (PT)    Recommendations for Other Services Rehab consult     Precautions / Restrictions Precautions Precautions: Fall Restrictions Weight Bearing Restrictions: No      Mobility  Bed Mobility Overal bed mobility: Needs Assistance Bed Mobility: Supine to Sit     Supine to sit: Min guard     General bed mobility comments: Use of hands to bring BLE's off edge of bed  Transfers Overall transfer level: Needs assistance Equipment used: Rolling walker (2 wheeled) Transfers: Sit to/from Stand Sit to Stand: Max assist         General transfer comment: Attempt to stand with maxA from elevated bed height but unable to achieve  upright positioning  Ambulation/Gait             General Gait Details: unable  Stairs            Wheelchair Mobility    Modified Rankin (Stroke Patients Only)       Balance Overall balance assessment: Needs assistance Sitting-balance support: Feet supported Sitting balance-Leahy Scale: Good                                       Pertinent Vitals/Pain Pain Assessment: Faces Faces Pain Scale: Hurts little more Pain Location: low back Pain Descriptors / Indicators: Numbness Pain Intervention(s): Monitored during session;Limited activity within patient's tolerance;Premedicated before session    Home Living Family/patient expects to be discharged to:: Private residence Living Arrangements: Spouse/significant other Available Help at Discharge: Family Type of Home: House Home Access: Stairs to enter   Technical brewer of Steps: 2 Home Layout: Able to live on main level with bedroom/bathroom        Prior Function Level of Independence: Independent         Comments: Works as a truck drive in [place]        Extremity/Trunk Assessment   Upper Extremity Assessment Upper Extremity Assessment: Overall WFL for tasks assessed    Lower Extremity Assessment Lower Extremity Assessment: RLE deficits/detail;LLE deficits/detail RLE Deficits / Details: Strength: Hip flexion 2+/5, knee extension 2+/5, ankle dorsiflexion/plantarflexion 0/5 RLE Sensation: decreased proprioception RLE Coordination: decreased gross motor LLE Deficits / Details: Strength: Hip flexion 4/5, knee extension 5/5, ankle dorsiflexion 0/5, ankle  plantarflexion 1/5 LLE Sensation: WNL LLE Coordination: decreased gross motor       Communication   Communication: No difficulties  Cognition Arousal/Alertness: Awake/alert Behavior During Therapy: WFL for tasks assessed/performed Overall Cognitive Status: Within Functional Limits for tasks assessed                                         General Comments      Exercises     Assessment/Plan    PT Assessment Patient needs continued PT services  PT Problem List Decreased strength;Decreased activity tolerance;Decreased balance;Decreased mobility;Decreased coordination;Impaired sensation;Pain       PT Treatment Interventions DME instruction;Gait training;Functional mobility training;Therapeutic activities;Balance training;Therapeutic exercise;Stair training;Patient/family education    PT Goals (Current goals can be found in the Care Plan section)  Acute Rehab PT Goals Patient Stated Goal: "to be more mobile." PT Goal Formulation: With patient Time For Goal Achievement: 11/22/18 Potential to Achieve Goals: Good    Frequency Min 5X/week   Barriers to discharge        Co-evaluation               AM-PAC PT "6 Clicks" Mobility  Outcome Measure Help needed turning from your back to your side while in a flat bed without using bedrails?: None Help needed moving from lying on your back to sitting on the side of a flat bed without using bedrails?: A Little Help needed moving to and from a bed to a chair (including a wheelchair)?: Total Help needed standing up from a chair using your arms (e.g., wheelchair or bedside chair)?: Total Help needed to walk in hospital room?: Total Help needed climbing 3-5 steps with a railing? : Total 6 Click Score: 11    End of Session Equipment Utilized During Treatment: Gait belt Activity Tolerance: Patient tolerated treatment well Patient left: in bed;with call bell/phone within reach;with family/visitor present Nurse Communication: Mobility status PT Visit Diagnosis: Other abnormalities of gait and mobility (R26.89);Pain Pain - part of body: (back)    Time: 1450-1511 PT Time Calculation (min) (ACUTE ONLY): 21 min   Charges:   PT Evaluation $PT Eval Moderate Complexity: 1 Mod          Laurina Bustle, [place], DPT Acute  Rehabilitation Services Pager [phone] Office [phone]   Vanetta Mulders 11/08/2018, 4:53 PM

## 2018-11-08 NOTE — Progress Notes (Addendum)
ANTICOAGULATION CONSULT NOTE  Pharmacy Consult for Lovenox Indication: VTE prophylaxis  Labs: Recent Labs    11/06/18 1651 11/07/18 0554 11/08/18 0402 11/08/18 0611  HGB 12.7* 13.4  --  11.5*  HCT 39.4 43.7  --  35.2*  PLT 261 236  --  207  CREATININE 1.11 1.11 1.08  --   CKTOTAL  --  623*  --   --   CKMB  --  3.6  --   --     Assessment: 52 yom presenting with numbness, tingling in bilateral legs secondary to L2-L3 disc herniation. Neurology recommends further evaluation by Neurosurgery. Pharmacy consulted to dose Lovenox for VTE prophylaxis. Hg down to 11.5, plt wnl. No active bleed issues documented. Not on anticoagulation PTA. Currently on SCDs.  Goal of Therapy:  VTE prevention Monitor platelets by anticoagulation protocol: Yes   Plan:  Lovenox 55mg  (0.5 mg/kg)  q24h (dosing for BMI>30) Watch SCr, CBC, s/sx bleeding  Joseph Reeves, PharmD, BCPS Clinical Pharmacist 11/08/2018 2:21 PM

## 2018-11-09 ENCOUNTER — Inpatient Hospital Stay (HOSPITAL_COMMUNITY): Payer: Self-pay

## 2018-11-09 ENCOUNTER — Inpatient Hospital Stay (HOSPITAL_COMMUNITY): Payer: Self-pay | Admitting: Certified Registered"

## 2018-11-09 ENCOUNTER — Encounter (HOSPITAL_COMMUNITY): Payer: Self-pay

## 2018-11-09 ENCOUNTER — Encounter (HOSPITAL_COMMUNITY)
Admission: EM | Disposition: A | Discharge status: Rehabilitation Facility | Payer: Self-pay | Source: Home / Self Care | Attending: Internal Medicine

## 2018-11-09 LAB — GLUCOSE, CAPILLARY: Glucose-Capillary: 94 mg/dL (ref 70–99)

## 2018-11-09 LAB — SURGICAL PCR SCREEN
MRSA, PCR: NEGATIVE
Staphylococcus aureus: NEGATIVE

## 2018-11-09 IMAGING — CR DG LUMBAR SPINE 2-3V
2 series · 2 of 2 positions shown · non-contrast
Comparison: [DATE]

CLINICAL DATA: Intraoperative localization

EXAM:
LUMBAR SPINE - 2-3 VIEW

[AP (1 of 2)]
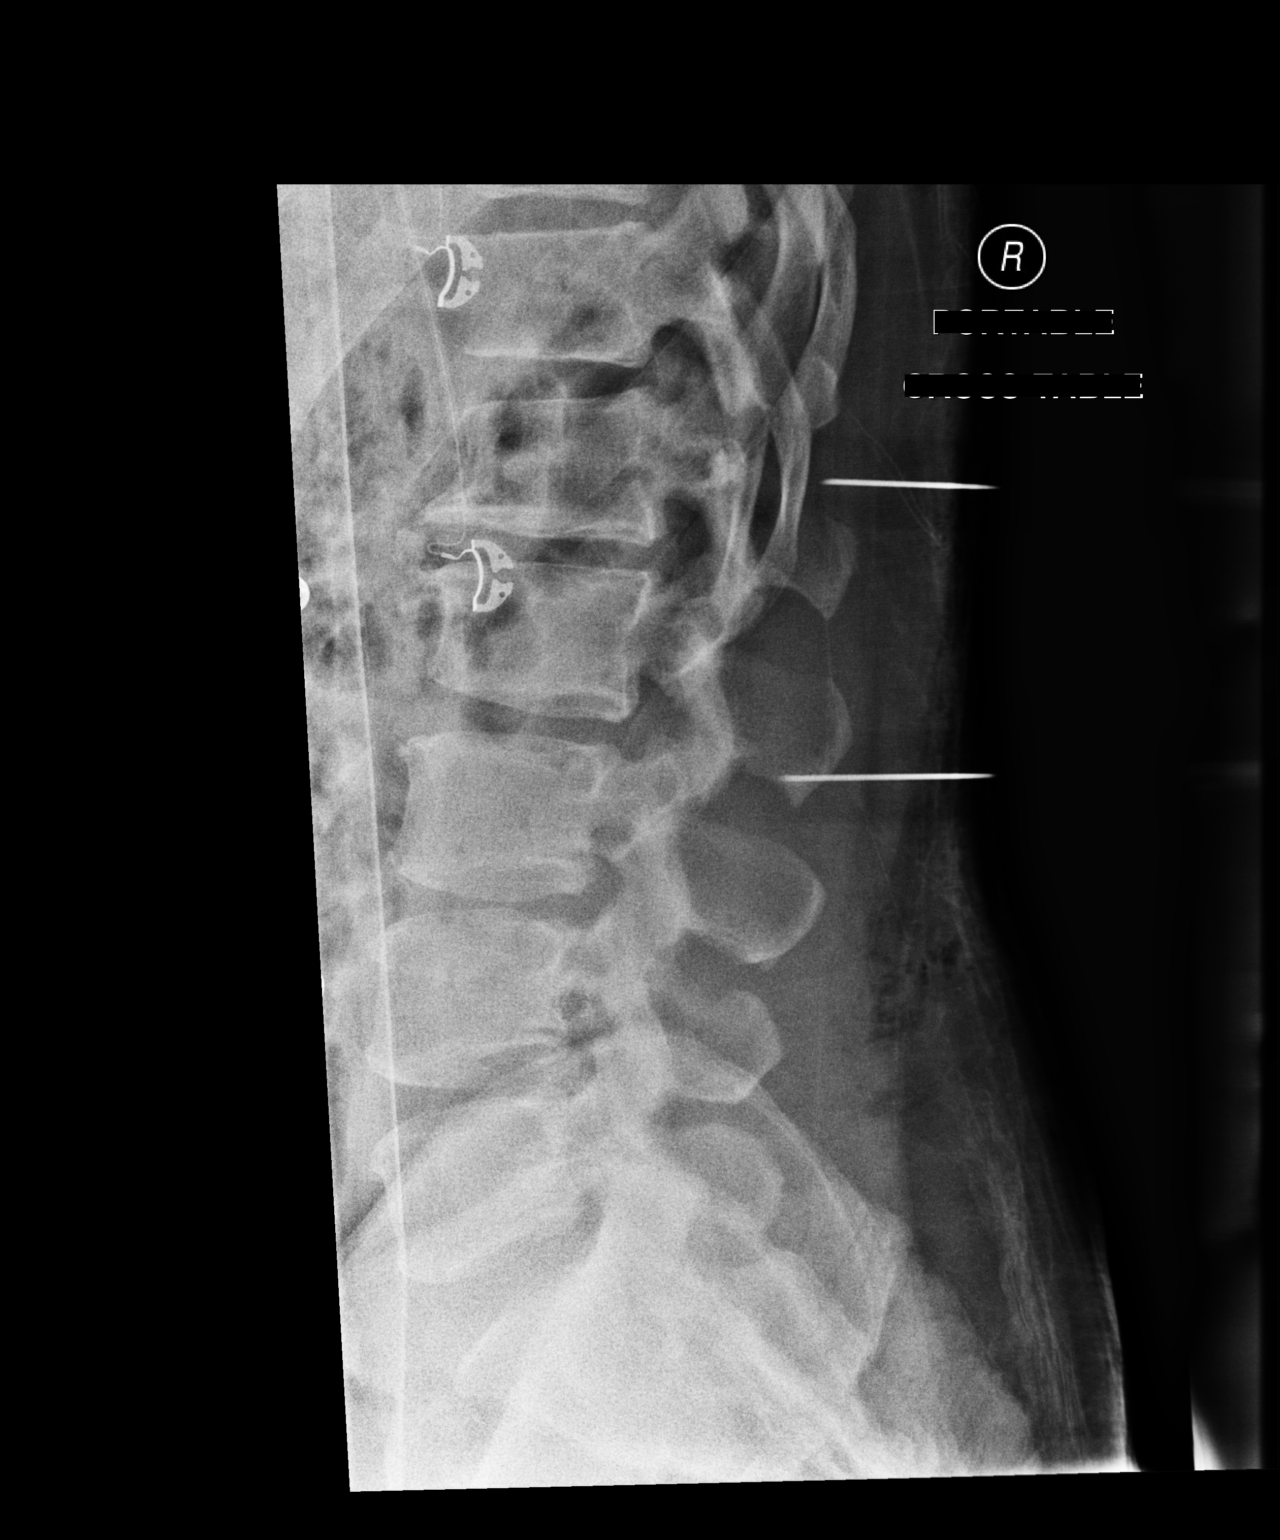

[AP (2 of 2)]
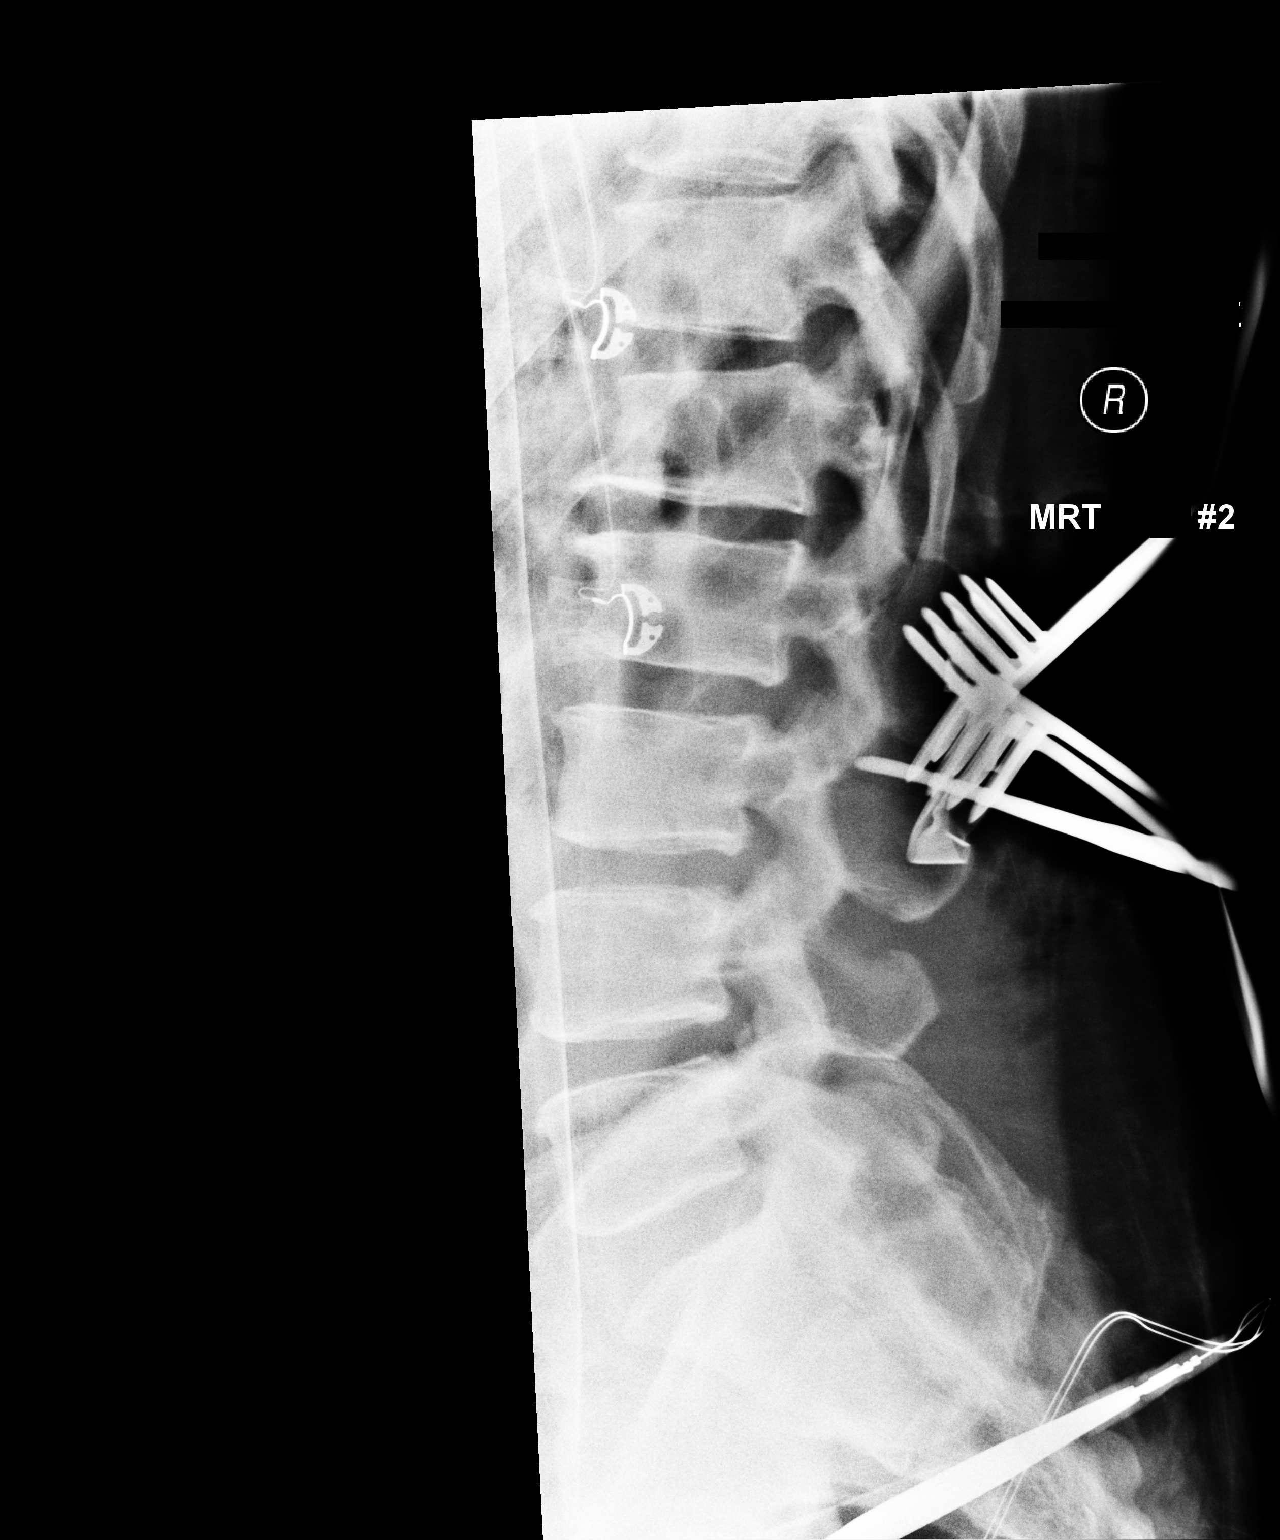

[2 of 2 positions shown; findings below may reference images not displayed]

FINDINGS: Lateral views of the lumbar spine were obtained. Initial image
demonstrates needles within the posterior soft tissues at the L1 and
L3 levels. Subsequent lateral few shows surgical retractors and
instruments at the L2-3 level.
IMPRESSION: Intraoperative localization at L2-3.

## 2018-11-09 IMAGING — RF DG LUMBAR SPINE 2-3V
1 series · 2 of 2 positions shown · non-contrast
Comparison: Intraoperative films from earlier in the same day.

CLINICAL DATA: L2-3 PLIF

EXAM:
DG C-ARM 1-60 MIN; LUMBAR SPINE - 2-3 VIEW

[Series 1: run · 2 of 2 slices shown]
[im 1/2]
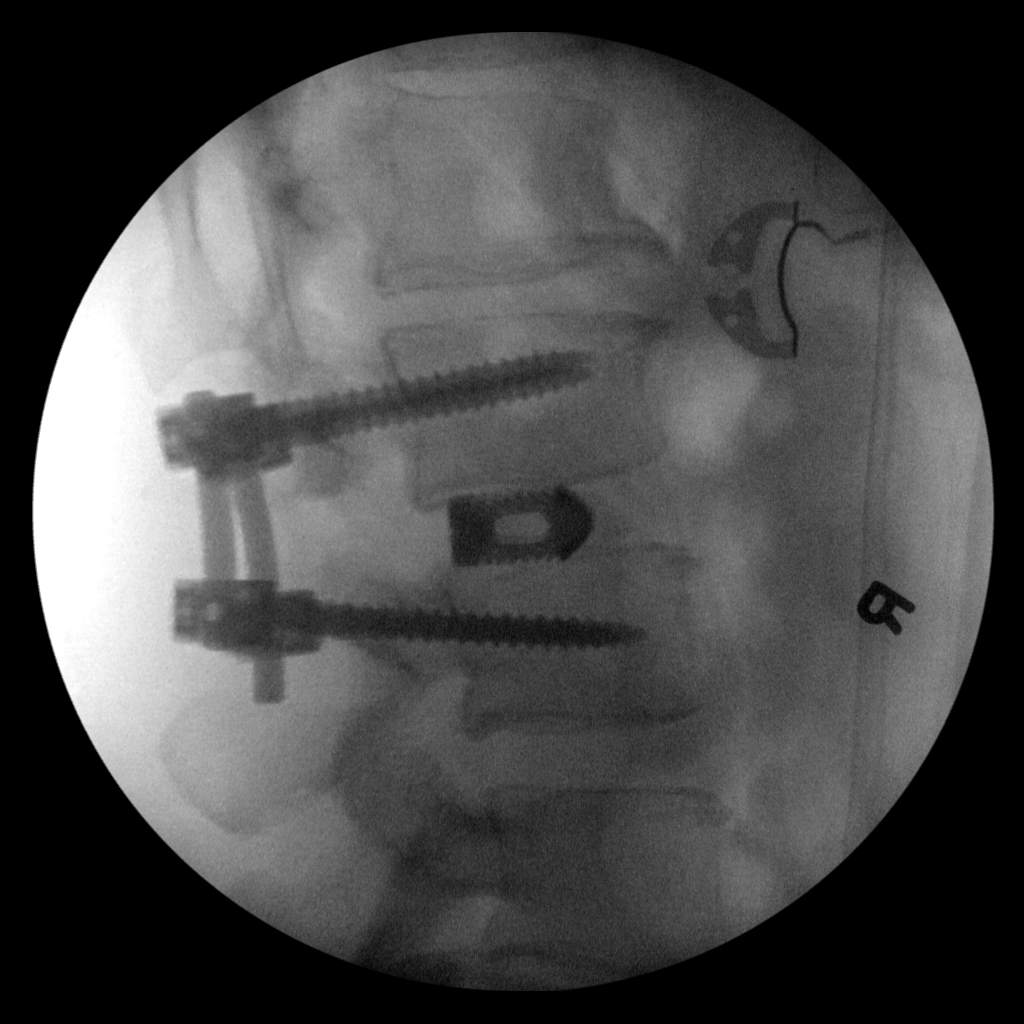
[im 2/2]
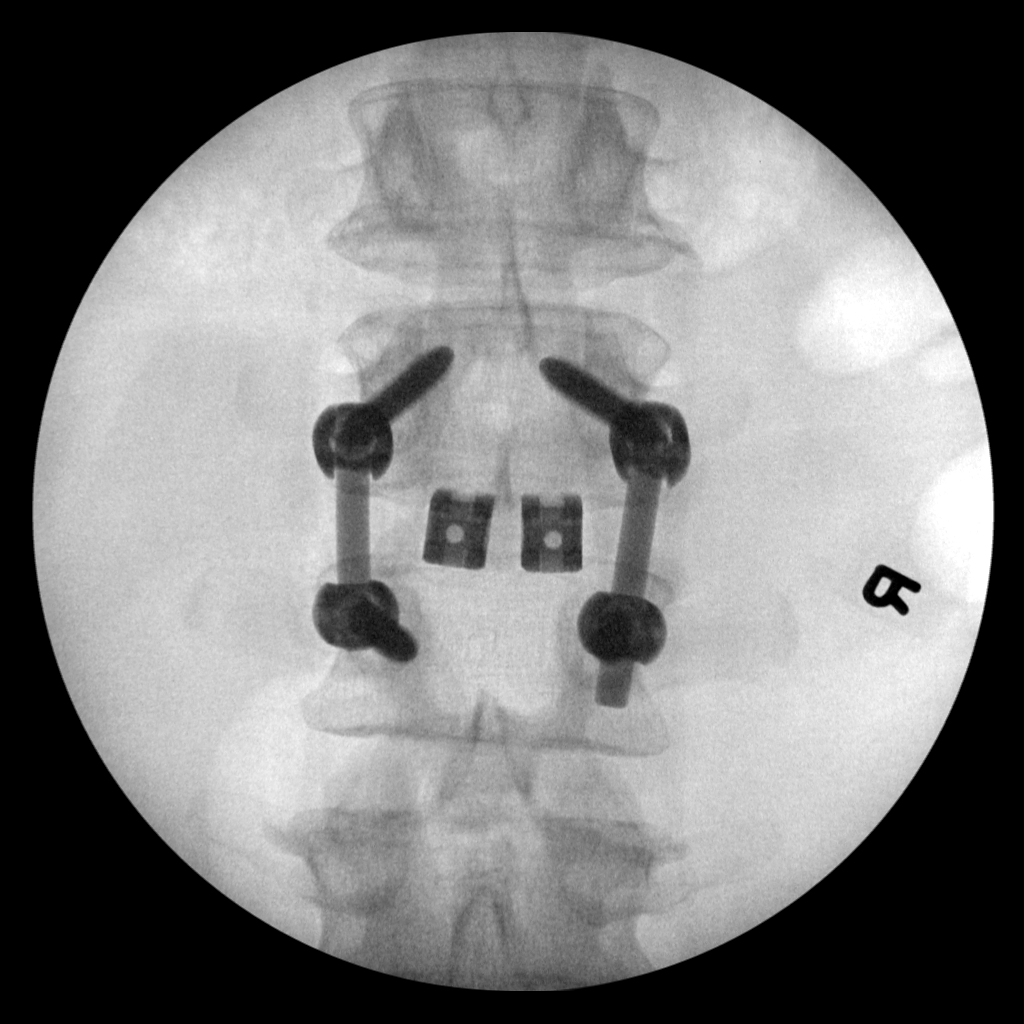

[2 of 2 positions shown; findings below may reference images not displayed]

FLUOROSCOPY TIME:  Fluoroscopy Time:  48 seconds

Radiation Exposure Index (if provided by the fluoroscopic device):
Not available

Number of Acquired Spot Images: 2
FINDINGS: Interbody fusion at L2-3 is noted with pedicle screws and posterior
fixation.
IMPRESSION: L2-3 fusion

## 2018-11-09 IMAGING — RF DG C-ARM 1-60 MIN
1 series · 2 of 2 positions shown · non-contrast
Comparison: Intraoperative films from earlier in the same day.

CLINICAL DATA: L2-3 PLIF

EXAM:
DG C-ARM 1-60 MIN; LUMBAR SPINE - 2-3 VIEW

[Series 1: run · 2 of 2 slices shown]
[im 1/2]
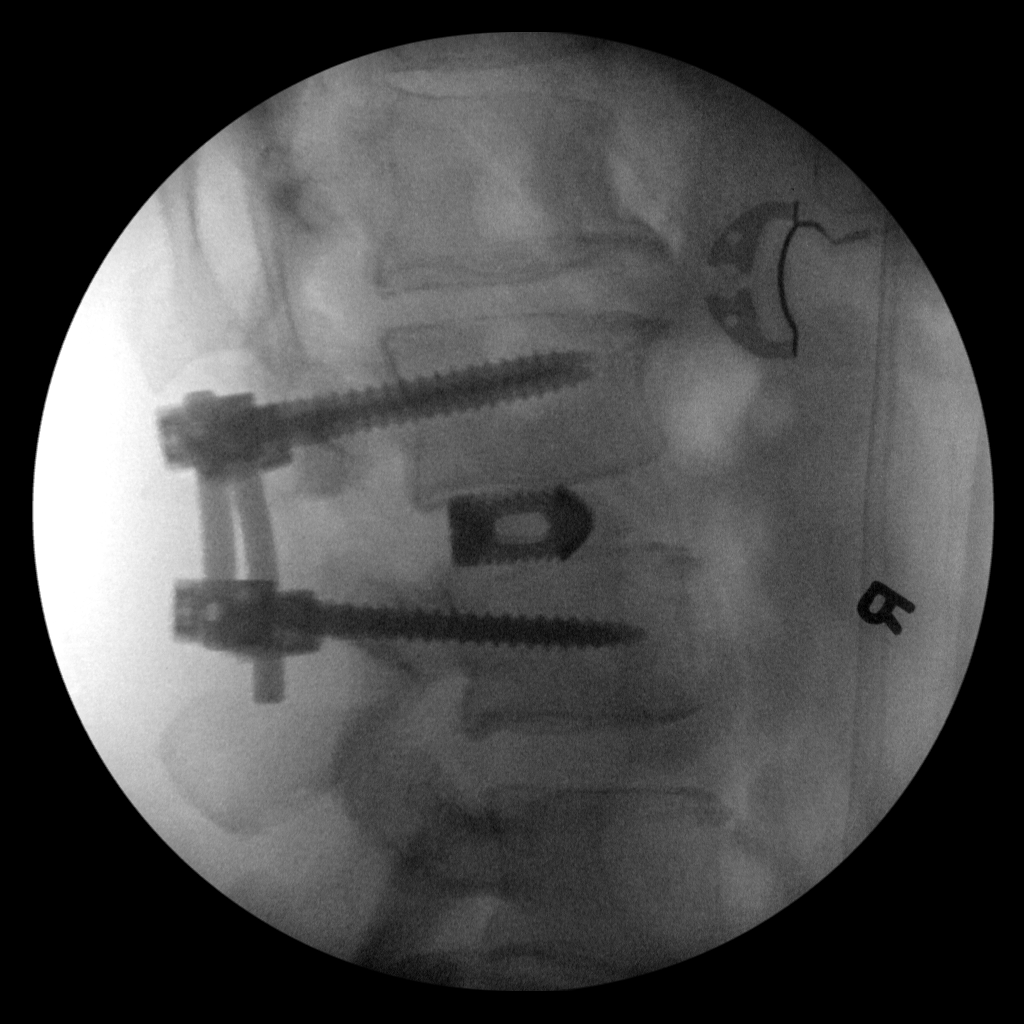
[im 2/2]
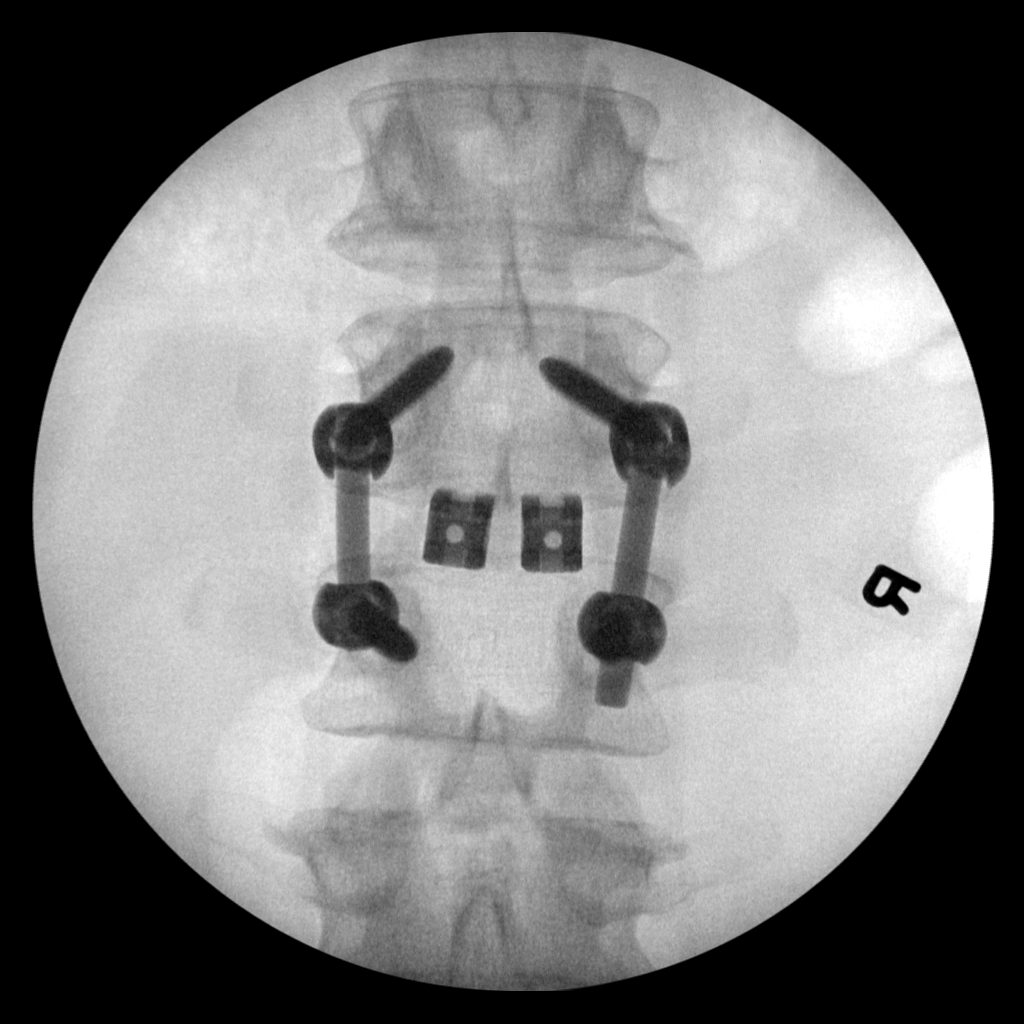

[2 of 2 positions shown; findings below may reference images not displayed]

FLUOROSCOPY TIME:  Fluoroscopy Time:  48 seconds

Radiation Exposure Index (if provided by the fluoroscopic device):
Not available

Number of Acquired Spot Images: 2
FINDINGS: Interbody fusion at L2-3 is noted with pedicle screws and posterior
fixation.
IMPRESSION: L2-3 fusion

## 2018-11-09 SURGERY — POSTERIOR LUMBAR FUSION 1 LEVEL
Anesthesia: General | Site: Back

## 2018-11-09 MED ORDER — OXYCODONE HCL 5 MG PO TABS: 5.0000 mg | ORAL_TABLET | Freq: Once | ORAL | Status: DC | PRN

## 2018-11-09 MED ORDER — THROMBIN 5000 UNITS EX SOLR
OROMUCOSAL | Status: DC | PRN
  Administered 2018-11-09: 5 mL via TOPICAL

## 2018-11-09 MED ORDER — ROCURONIUM BROMIDE 50 MG/5ML IV SOSY
PREFILLED_SYRINGE | INTRAVENOUS | Status: DC | PRN
  Administered 2018-11-09: 100 mg via INTRAVENOUS

## 2018-11-09 MED ORDER — ONDANSETRON HCL 4 MG/2ML IJ SOLN
INTRAMUSCULAR | Status: DC | PRN
  Administered 2018-11-09: 4 mg via INTRAVENOUS

## 2018-11-09 MED ORDER — HYDROCODONE-ACETAMINOPHEN 5-325 MG PO TABS
1.0000 | ORAL_TABLET | ORAL | Status: DC | PRN
  Administered 2018-11-09 – 2018-11-13 (×15): 2 via ORAL
  Filled 2018-11-09 (×16): qty 2

## 2018-11-09 MED ORDER — DEXAMETHASONE SODIUM PHOSPHATE 10 MG/ML IJ SOLN
INTRAMUSCULAR | Status: AC
  Filled 2018-11-09: qty 1

## 2018-11-09 MED ORDER — LIDOCAINE-EPINEPHRINE 1 %-1:100000 IJ SOLN
INTRAMUSCULAR | Status: DC | PRN
  Administered 2018-11-09: 22.5 mL

## 2018-11-09 MED ORDER — FENTANYL CITRATE (PF) 250 MCG/5ML IJ SOLN
INTRAMUSCULAR | Status: AC
  Filled 2018-11-09: qty 5

## 2018-11-09 MED ORDER — LABETALOL HCL 5 MG/ML IV SOLN
INTRAVENOUS | Status: DC | PRN
  Administered 2018-11-09: 10 mg via INTRAVENOUS

## 2018-11-09 MED ORDER — MORPHINE SULFATE (PF) 4 MG/ML IV SOLN
4.0000 mg | INTRAVENOUS | Status: DC | PRN
  Administered 2018-11-11: 4 mg via INTRAMUSCULAR
  Filled 2018-11-09: qty 1

## 2018-11-09 MED ORDER — PROPOFOL 10 MG/ML IV BOLUS
INTRAVENOUS | Status: AC
  Filled 2018-11-09: qty 20

## 2018-11-09 MED ORDER — CEFAZOLIN SODIUM-DEXTROSE 2-3 GM-%(50ML) IV SOLR
INTRAVENOUS | Status: DC | PRN
  Administered 2018-11-09: 2 g via INTRAVENOUS
  Administered 2018-11-09: 1 g via INTRAVENOUS

## 2018-11-09 MED ORDER — ACETAMINOPHEN 10 MG/ML IV SOLN
INTRAVENOUS | Status: DC | PRN
  Administered 2018-11-09: 1000 mg via INTRAVENOUS

## 2018-11-09 MED ORDER — SODIUM CHLORIDE 0.9 % IV SOLN
INTRAVENOUS | Status: DC | PRN
  Administered 2018-11-09: 500 mL

## 2018-11-09 MED ORDER — LIDOCAINE-EPINEPHRINE 1 %-1:100000 IJ SOLN
INTRAMUSCULAR | Status: AC
  Filled 2018-11-09: qty 1

## 2018-11-09 MED ORDER — LIDOCAINE 2% (20 MG/ML) 5 ML SYRINGE
INTRAMUSCULAR | Status: AC
  Filled 2018-11-09: qty 5

## 2018-11-09 MED ORDER — LIDOCAINE 2% (20 MG/ML) 5 ML SYRINGE
INTRAMUSCULAR | Status: DC | PRN
  Administered 2018-11-09: 100 mg via INTRAVENOUS

## 2018-11-09 MED ORDER — ALBUMIN HUMAN 5 % IV SOLN
INTRAVENOUS | Status: DC | PRN
  Administered 2018-11-09: 14:00:00 via INTRAVENOUS

## 2018-11-09 MED ORDER — LACTATED RINGERS IV SOLN
INTRAVENOUS | Status: DC | PRN
  Administered 2018-11-09 (×2): via INTRAVENOUS

## 2018-11-09 MED ORDER — LABETALOL HCL 5 MG/ML IV SOLN
INTRAVENOUS | Status: AC
  Filled 2018-11-09: qty 4

## 2018-11-09 MED ORDER — THROMBIN 5000 UNITS EX SOLR
CUTANEOUS | Status: AC
  Filled 2018-11-09: qty 5000

## 2018-11-09 MED ORDER — 0.9 % SODIUM CHLORIDE (POUR BTL) OPTIME
TOPICAL | Status: DC | PRN
  Administered 2018-11-09 (×3): 1000 mL

## 2018-11-09 MED ORDER — THROMBIN 20000 UNITS EX SOLR
CUTANEOUS | Status: DC | PRN
  Administered 2018-11-09: 20 mL via TOPICAL

## 2018-11-09 MED ORDER — HYDROMORPHONE HCL 1 MG/ML IJ SOLN: 0.2500 mg | INTRAMUSCULAR | Status: DC | PRN

## 2018-11-09 MED ORDER — THROMBIN 20000 UNITS EX SOLR
CUTANEOUS | Status: AC
  Filled 2018-11-09: qty 20000

## 2018-11-09 MED ORDER — OXYCODONE HCL 5 MG/5ML PO SOLN: 5.0000 mg | Freq: Once | ORAL | Status: DC | PRN

## 2018-11-09 MED ORDER — MUPIROCIN 2 % EX OINT
1.0000 "application " | TOPICAL_OINTMENT | Freq: Two times a day (BID) | CUTANEOUS | Status: DC
  Administered 2018-11-09 – 2018-11-13 (×9): 1 via NASAL
  Filled 2018-11-09: qty 22

## 2018-11-09 MED ORDER — ONDANSETRON HCL 4 MG/2ML IJ SOLN
INTRAMUSCULAR | Status: AC
  Filled 2018-11-09: qty 2

## 2018-11-09 MED ORDER — PROPOFOL 10 MG/ML IV BOLUS
INTRAVENOUS | Status: DC | PRN
  Administered 2018-11-09: 200 mg via INTRAVENOUS

## 2018-11-09 MED ORDER — BUPIVACAINE HCL (PF) 0.5 % IJ SOLN
INTRAMUSCULAR | Status: DC | PRN
  Administered 2018-11-09: 22.5 mL

## 2018-11-09 MED ORDER — PROMETHAZINE HCL 25 MG/ML IJ SOLN: 6.2500 mg | INTRAMUSCULAR | Status: DC | PRN

## 2018-11-09 MED ORDER — MIDAZOLAM HCL 5 MG/5ML IJ SOLN
INTRAMUSCULAR | Status: DC | PRN
  Administered 2018-11-09: 2 mg via INTRAVENOUS

## 2018-11-09 MED ORDER — MIDAZOLAM HCL 2 MG/2ML IJ SOLN
INTRAMUSCULAR | Status: AC
  Filled 2018-11-09: qty 2

## 2018-11-09 MED ORDER — CEFAZOLIN SODIUM 1 G IJ SOLR
INTRAMUSCULAR | Status: AC
  Filled 2018-11-09: qty 20

## 2018-11-09 MED ORDER — MEPERIDINE HCL 25 MG/ML IJ SOLN: 6.2500 mg | INTRAMUSCULAR | Status: DC | PRN

## 2018-11-09 MED ORDER — DEXAMETHASONE SODIUM PHOSPHATE 10 MG/ML IJ SOLN
INTRAMUSCULAR | Status: DC | PRN
  Administered 2018-11-09: 10 mg via INTRAVENOUS

## 2018-11-09 MED ORDER — ACETAMINOPHEN 10 MG/ML IV SOLN
INTRAVENOUS | Status: AC
  Filled 2018-11-09: qty 100

## 2018-11-09 MED ORDER — BUPIVACAINE HCL (PF) 0.5 % IJ SOLN
INTRAMUSCULAR | Status: AC
  Filled 2018-11-09: qty 30

## 2018-11-09 MED ORDER — FENTANYL CITRATE (PF) 100 MCG/2ML IJ SOLN
INTRAMUSCULAR | Status: DC | PRN
  Administered 2018-11-09: 100 ug via INTRAVENOUS
  Administered 2018-11-09: 50 ug via INTRAVENOUS
  Administered 2018-11-09: 100 ug via INTRAVENOUS
  Administered 2018-11-09: 50 ug via INTRAVENOUS
  Administered 2018-11-09: 100 ug via INTRAVENOUS
  Administered 2018-11-09 (×3): 50 ug via INTRAVENOUS

## 2018-11-09 MED ORDER — CEFAZOLIN SODIUM 1 G IJ SOLR
INTRAMUSCULAR | Status: AC
  Filled 2018-11-09: qty 10

## 2018-11-09 SURGICAL SUPPLY — 69 items
BAG DECANTER FOR FLEXI CONT (MISCELLANEOUS) ×2 IMPLANT
BENZOIN TINCTURE PRP APPL 2/3 (GAUZE/BANDAGES/DRESSINGS) ×2 IMPLANT
BLADE CLIPPER SURG (BLADE) ×2 IMPLANT
BUR ACRON 5.0MM COATED (BURR) ×2 IMPLANT
BUR MATCHSTICK NEURO 3.0 LAGG (BURR) ×2 IMPLANT
CAGE LUM TRIT 9X23X12 6D (Cage) ×4 IMPLANT
CANISTER SUCT 3000ML PPV (MISCELLANEOUS) ×2 IMPLANT
CAP LCK SPNE (Orthopedic Implant) ×4 IMPLANT
CAP LOCK SPINE RADIUS (Orthopedic Implant) ×4 IMPLANT
CAP LOCKING (Orthopedic Implant) ×4 IMPLANT
CARTRIDGE OIL MAESTRO DRILL (MISCELLANEOUS) ×1 IMPLANT
CONT SPEC 4OZ CLIKSEAL STRL BL (MISCELLANEOUS) ×2 IMPLANT
COVER BACK TABLE 60X90IN (DRAPES) ×2 IMPLANT
COVER WAND RF STERILE (DRAPES) ×2 IMPLANT
DECANTER SPIKE VIAL GLASS SM (MISCELLANEOUS) ×2 IMPLANT
DERMABOND ADVANCED (GAUZE/BANDAGES/DRESSINGS) ×1
DERMABOND ADVANCED .7 DNX12 (GAUZE/BANDAGES/DRESSINGS) ×1 IMPLANT
DIFFUSER DRILL AIR PNEUMATIC (MISCELLANEOUS) ×2 IMPLANT
DRAPE C-ARM 42X72 X-RAY (DRAPES) ×4 IMPLANT
DRAPE C-ARMOR (DRAPES) ×2 IMPLANT
DRAPE HALF SHEET 40X57 (DRAPES) ×4 IMPLANT
DRAPE LAPAROTOMY 100X72X124 (DRAPES) ×2 IMPLANT
DRAPE POUCH INSTRU U-SHP 10X18 (DRAPES) ×2 IMPLANT
ELECT REM PT RETURN 9FT ADLT (ELECTROSURGICAL) ×2
ELECTRODE REM PT RTRN 9FT ADLT (ELECTROSURGICAL) ×1 IMPLANT
GAUZE 4X4 16PLY RFD (DISPOSABLE) IMPLANT
GAUZE SPONGE 4X4 12PLY STRL (GAUZE/BANDAGES/DRESSINGS) ×2 IMPLANT
GAUZE SPONGE 4X4 12PLY STRL LF (GAUZE/BANDAGES/DRESSINGS) ×2 IMPLANT
GLOVE BIOGEL PI IND STRL 8 (GLOVE) ×2 IMPLANT
GLOVE BIOGEL PI INDICATOR 8 (GLOVE) ×2
GLOVE ECLIPSE 7.5 STRL STRAW (GLOVE) ×4 IMPLANT
GOWN STRL REUS W/ TWL LRG LVL3 (GOWN DISPOSABLE) ×2 IMPLANT
GOWN STRL REUS W/ TWL XL LVL3 (GOWN DISPOSABLE) ×2 IMPLANT
GOWN STRL REUS W/TWL 2XL LVL3 (GOWN DISPOSABLE) IMPLANT
GOWN STRL REUS W/TWL LRG LVL3 (GOWN DISPOSABLE) ×2
GOWN STRL REUS W/TWL XL LVL3 (GOWN DISPOSABLE) ×2
HEMOSTAT POWDER SURGIFOAM 1G (HEMOSTASIS) ×2 IMPLANT
KIT BASIN OR (CUSTOM PROCEDURE TRAY) ×2 IMPLANT
KIT INFUSE SMALL (Orthopedic Implant) ×2 IMPLANT
KIT TURNOVER KIT B (KITS) ×2 IMPLANT
NEEDLE ASP BONE MRW 8GX15 (NEEDLE) ×2 IMPLANT
NEEDLE SPNL 18GX3.5 QUINCKE PK (NEEDLE) ×2 IMPLANT
NEEDLE SPNL 22GX3.5 QUINCKE BK (NEEDLE) ×2 IMPLANT
NS IRRIG 1000ML POUR BTL (IV SOLUTION) ×6 IMPLANT
OIL CARTRIDGE MAESTRO DRILL (MISCELLANEOUS) ×2
PACK LAMINECTOMY NEURO (CUSTOM PROCEDURE TRAY) ×2 IMPLANT
PAD ARMBOARD 7.5X6 YLW CONV (MISCELLANEOUS) IMPLANT
PATTIES SURGICAL .5 X.5 (GAUZE/BANDAGES/DRESSINGS) IMPLANT
PATTIES SURGICAL .5 X1 (DISPOSABLE) IMPLANT
PATTIES SURGICAL 1X1 (DISPOSABLE) IMPLANT
ROD 50MM (Rod) ×1 IMPLANT
ROD RADIUS 45MM (Rod) ×1 IMPLANT
ROD SPNL 45X5.5XNS TI RDS (Rod) ×1 IMPLANT
ROD SPNL 50X5.5XNS TI RDS (Rod) ×1 IMPLANT
SCREW 5.75X50MM (Screw) ×8 IMPLANT
SPONGE LAP 4X18 RFD (DISPOSABLE) IMPLANT
SPONGE NEURO XRAY DETECT 1X3 (DISPOSABLE) IMPLANT
SPONGE SURGIFOAM ABS GEL 100 (HEMOSTASIS) ×2 IMPLANT
STRIP BIOACTIVE VITOSS 25X100X (Neuro Prosthesis/Implant) ×4 IMPLANT
SUT VIC AB 1 CT1 18XBRD ANBCTR (SUTURE) ×2 IMPLANT
SUT VIC AB 1 CT1 8-18 (SUTURE) ×2
SUT VIC AB 2-0 CP2 18 (SUTURE) ×4 IMPLANT
SYR 3ML LL SCALE MARK (SYRINGE) IMPLANT
SYR CONTROL 10ML LL (SYRINGE) ×2 IMPLANT
TAPE CLOTH SURG 4X10 WHT LF (GAUZE/BANDAGES/DRESSINGS) ×2 IMPLANT
TOWEL GREEN STERILE (TOWEL DISPOSABLE) ×2 IMPLANT
TOWEL GREEN STERILE FF (TOWEL DISPOSABLE) ×2 IMPLANT
TRAY FOLEY MTR SLVR 16FR STAT (SET/KITS/TRAYS/PACK) IMPLANT
WATER STERILE IRR 1000ML POUR (IV SOLUTION) ×2 IMPLANT

## 2018-11-09 NOTE — Progress Notes (Signed)
Vitals:   11/09/18 1623 11/09/18 1638 11/09/18 1645 11/09/18 1721  BP: (!) 146/86 (!) 151/95  (!) 131/100  Pulse: 92 92  73  Resp: 16 (!) 23  13  Temp:   (!) 97.2 F (36.2 C) 99.2 F (37.3 C)  TempSrc:    Oral  SpO2: 98% 96%  97%  Weight:      Height:        CBC Recent Labs    11/07/18 0554 11/07/18 1854 11/08/18 0611  WBC 9.2  --  8.7  HGB 13.4  --  11.5*  HCT 43.7 38.1 35.2*  PLT 236  --  207   BMET Recent Labs    11/07/18 0554 11/08/18 0402  NA 143 140  K 3.9 4.0  CL 109 106  CO2 21* 26  GLUCOSE 111* 128*  BUN 25* 22*  CREATININE 1.11 1.08  CALCIUM 9.0 8.6*    Patient resting in bed, his cousin is at his bedside.  He is comfortable, without complaints.  He notes that the numbness in his buttocks, legs, and feet is gone.    On examination his dressing is clean and dry.  We see weak movement of the feet and toes (he is able to wiggle them), left better than right.  However last night he had no movement in his feet and toes.   Plan: For unknown reasons, the lumbar Aspen corset that I ordered last night (24 hours ago) has not been delivered.  I have written for the patient to be mobilized (donning and doffing the brace standing at the bedside).  I have written for PT and OT to resume.  I do feel the patient is appropriate for and will benefit from comprehensive inpatient rehabilitation (CIR).  Hosie Spangle, MD 11/09/2018, 6:27 PM

## 2018-11-09 NOTE — Op Note (Signed)
11/09/2018  3:55 PM  PATIENT:  Joseph Reeves  53 y.o. 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; lumbar spondylosis; lumbar degenerative disc disease  PROCEDURE:  Procedure(s): L2-3 lumbar decompression including L2 and L3 lumbar laminectomy, bilateral L2-3 facetectomy, L2-3 discectomy, bilateral L2 and L3 foraminotomies, decompression of severe central canal, lateral recess, and neuroforaminal stenosis; bilateral L2-3 posterior lumbar interbody arthrodesis with Tritanium interbody implants, Vitoss BA with bone marrow aspirate, and infuse; bilateral L2-3 posterior lateral arthrodesis with nonsegmental radius posterior instrumentation, locally harvested morselized autograft, Vitoss BA with bone marrow aspirate, and infuse  SURGEON: Jovita Gamma, MD  ASSISTANTS: Newman Pies, MD  ANESTHESIA:   general  EBL:  Total I/O In: 1750 [I.V.:1500; IV Piggyback:250] Out: 1110 [Urine:860; Blood:250]  BLOOD ADMINISTERED:none  CELL SAVER GIVEN: Cell Saver technician felt that there was insufficient processed blood to return any to the patient.  COUNT: Correct per nursing staff  DICTATION: Patient is brought to the operating room placed under general endotracheal anesthesia. The patient was turned to prone position the lumbar region was prepped with Betadine soap and solution and draped in a sterile fashion. The midline was infiltrated with local anesthesia with epinephrine. A localizing x-ray was  taken and then a midline incision was made carried down through the subcutaneous tissue, bipolar cautery and electrocautery were used to maintain hemostasis. Dissection was carried down to the lumbar fascia. The fascia was incised bilaterally and the paraspinal muscles were dissected with a spinous process and lamina in a subperiosteal fashion. Another x-ray was taken for localization and the L2-3 level was localized. Dissection was then carried out laterally over the facet complex and the transverse processes of L2 and L3 were exposed and decorticated.  The L2 and L3 laminectomy was begun with double-action rongeurs, the high-speed drill, and Kerrison punches.  The right L2-3 facetectomy was performed using the high-speed drill and Kerrison punches.  We exposed the right L2-3 neural foramen and identified the lateral aspect of the thecal sac and the lateral annulus.  The annulus was incised, and we entered the disc space and began a discectomy using a variety of pituitary rongeurs and microcurettes.  We then mobilized a very large free fragment from the epidural space, located caudally behind the body of L3.  This decompressed the thecal sac and central spinal canal.  We then proceeded with the remainder the decompression, completing the inferior L2 and superior L3 laminectomy using the high-speed drill and Kerrison punches.  A left L2-3 facetectomy was performed with the high-speed drill and Kerrison punches.  Foraminotomies were performed for the exiting L2 and L3 nerve roots bilaterally.  The lateral annulus on the left was then exposed.  The annulus was incised and the disc space entered and we continued the discectomy again using a variety of pituitary rongeurs and microcurettes.  A thorough discectomy was performed and in the end good decompression of the thecal sac, lateral recesses, and exiting nerve which was completed. Once the discectomy was completed we began to prepare the endplate surfaces removing  the cartilaginous endplates surfaces. We then measured the height of the intervertebral disc space. We selected 12 x 23  x 6 x 9 Tritanium interbody implants.  The C-arm fluoroscope was then draped and brought in the field and we identified the pedicle entry points bilaterally at the L2 and L3 levels. Each of the 4 pedicles was probed, we aspirated bone marrow aspirate from the vertebral bodies, this was injected over two 10 cc strips of Vitoss BA. Then each of the pedicles was examined with the ball probe, good bony surfaces were found and no bony cuts were found. Each of the pedicles was then tapped with a 5.25 mm tap, again examined with the ball probe, good threading was found and no bony cuts were found. We then placed 5.75 by 50 millimeter screws bilaterally at each level.  We then packed the interbody implants with Vitoss BA with bone marrow aspirate and infuse, and then placed the first implant and on the right side, carefully retracting the thecal sac and nerve root medially. We then went back to the left side and packed the midline with additional Vitoss BA with bone marrow aspirate and infuse, and then placed a second implant and on the left side again retracting the thecal sac and nerve root medially. Additional Vitoss BA with bone marrow aspirate and infuse was packed lateral to the implants.  We then packed the lateral gutter over the transverse processes and intertransverse space with locally harvested morselized autograft, Vitoss BA with bone marrow aspirate, and infuse. We then selected prelordosed rods.  We used a 40 mm rod on the left and a 50 mm rod on the right.  They were placed within the screw heads and secured with locking caps.  Once all 4 locking caps were placed final tightening was performed against a counter torque.  The wound had been irrigated multiple times during the procedure with saline solution and bacitracin solution, good hemostasis was established with a combination of  bipolar cautery and Gelfoam with thrombin.  The Gelfoam was removed, and a thin layer of Surgifoam applied.  Once good hemostasis was confirmed we proceeded with closure paraspinal muscles deep fascia and Scarpa's fascia were closed in separate layers with interrupted undyed 1 Vicryl sutures.  The subcutaneous and subcuticular closed with interrupted inverted 2-0 undyed Vicryl sutures the skin edges were approximated with Dermabond.  The wound was dressed with sterile gauze and Hypafix.  Following surgery the patient was turned back to the supine position to be reversed and the anesthetic extubated and transferred to the recovery room for further care.  PLAN OF CARE: Patient is to have initial care in the PACU, and then return to the inpatient unit.  PATIENT DISPOSITION:  PACU - hemodynamically stable.   Delay start of Pharmacological VTE agent (>24hrs) due to surgical blood loss or risk of bleeding:  yes

## 2018-11-09 NOTE — Progress Notes (Signed)
Attempted to see pt today for an OT evaluation.  Pt in surgery for spine. Will attempt back as schedule allows.  Jinger Neighbors, [place] [phone]

## 2018-11-09 NOTE — Transfer of Care (Signed)
Immediate Anesthesia Transfer of Care Note  Patient: Joseph Reeves  Procedure(s) Performed: Lumbar two-three Decompression Posterior Lumbar Interbody Fusion , Posterior Lateral Arthrodesis (N/A Back)  Patient Location: PACU  Anesthesia Type:General  Level of Consciousness: drowsy  Airway & Oxygen Therapy: Patient Spontanous Breathing and Patient connected to face mask oxygen  Post-op Assessment: Report given to RN and Post -op Vital signs reviewed and stable  Post vital signs: Reviewed and stable  Last Vitals:  Vitals Value Taken Time  BP 145/84 11/09/18 1609  Temp    Pulse 94 11/09/18 1613  Resp 14 11/09/18 1613  SpO2 100 % 11/09/18 1613  Vitals shown include unvalidated device data.  Last Pain:  Vitals:   11/09/18 0906  TempSrc:   PainSc: 4          Complications: No apparent anesthesia complications

## 2018-11-09 NOTE — Progress Notes (Signed)
PROGRESS NOTE    Joseph PoliceChristopher Reeves  MWU:[id number] DOB: 09/17/1965 DOA: 11/06/2018 PCP: Patient, No Pcp Per     Brief Narrative:  Joseph PoliceChristopher Sartin is a 53 year old male who lives in [place] and visiting [place] due to work presents with numbness and tingling of bilateral legs since Monday.  Symptoms have progressed to include weakness and significantly getting worse.  He has been evaluated by emergency department 3 times in the past week.  Patient underwent lumbar puncture which resulted in a bloody tap.  He also underwent MRI of thoracic and lumbar spine.  Neurosurgery and neurology consulted.  New events last 24 hours / Subjective: No new complaints this morning other than the continued weakness and tingling in his lower extremities.  Denies any chest pain, shortness of breath, abdominal pain, nausea or vomiting.  Assessment & Plan:   Principal Problem:   Leg weakness Active Problems:   Numbness and tingling of both legs   Numbness, tingling, weakness of the bilateral lower extremities secondary to L2-L3 disc herniation -Neurology evaluated patient during hospitalization and has recommended further neurosurgery evaluation. -L2-L3 decompression and laminectomy planned for today    DVT prophylaxis: SCD Code Status: Full code Family Communication: No family at bedside Disposition Plan: OR today.  Therapy recommending CIR   Consultants:   Neurology  Neurosurgery  Procedures:   None  Antimicrobials:  Anti-infectives (From admission, onward)   Start     Dose/Rate Route Frequency Ordered Stop   11/09/18 1107  bacitracin 50,000 Units in sodium chloride 0.9 % 500 mL irrigation       As needed 11/09/18 1107         Objective: Vitals:   11/08/18 1945 11/08/18 2323 11/09/18 0821 11/09/18 1028  BP: (!) 147/93 128/72 (!) 149/93   Pulse: 68 (!) 59 69   Resp: 18 17 19    Temp: 98.7 F (37.1 C) 98 F (36.7 C) 98.3 F (36.8 C)   TempSrc: Oral Oral Oral     SpO2: 100% 99% 99%   Weight:    108.5 kg  Height:    6\' 1"  (1.854 m)    Intake/Output Summary (Last 24 hours) at 11/09/2018 1309 Last data filed at 11/09/2018 1229 Gross per 24 hour  Intake 1265.33 ml  Output 1320 ml  Net -54.67 ml   Filed Weights   11/06/18 1405 11/08/18 0428 11/09/18 1028  Weight: 104.3 kg 108.5 kg 108.5 kg    Examination: General exam: Appears calm and comfortable  Respiratory system: Clear to auscultation. Respiratory effort normal. Cardiovascular system: S1 & S2 heard, RRR. No pedal edema. Gastrointestinal system: Abdomen is nondistended, soft and nontender. Normal bowel sounds heard. Central nervous system: Alert and oriented. Speech clear  Extremities: Symmetric in appearance bilaterally  GU: Foley in place  Skin: No rashes, lesions or ulcers on exposed skin  Psychiatry: Judgement and insight appear stable. Mood & affect appropriate.     Data Reviewed: I have personally reviewed following labs and imaging studies  CBC: Recent Labs  Lab 11/06/18 1651 11/07/18 0554 11/07/18 1854 11/08/18 0611  WBC 13.5* 9.2  --  8.7  NEUTROABS 11.2*  --   --   --   HGB 12.7* 13.4  --  11.5*  HCT 39.4 43.7 38.1 35.2*  MCV 97.8 108.4*  --  98.1  PLT 261 236  --  207   Basic Metabolic Panel: Recent Labs  Lab 11/06/18 1651 11/07/18 0554 11/08/18 0402  NA 141 143 140  K 3.7 3.9  4.0  CL 102 109 106  CO2 27 21* 26  GLUCOSE 130* 111* 128*  BUN 27* 25* 22*  CREATININE 1.11 1.11 1.08  CALCIUM 9.1 9.0 8.6*   GFR: Estimated Creatinine Clearance: 103.3 mL/min (by C-G formula based on SCr of 1.08 mg/dL). Liver Function Tests: Recent Labs  Lab 11/06/18 1651 11/07/18 0554 11/08/18 0402  AST ALT ALKPHOS 35* 29* 31*  BILITOT 0.5 QUANTITY NOT SUFFICIENT, UNABLE TO PERFORM TEST 0.9  PROT 7.7 6.7 6.0*  ALBUMIN 4.4 3.8 3.4*   Recent Labs  Lab 11/06/18 1651  LIPASE 22   No results for input(s): AMMONIA in the last 168  hours. Coagulation Profile: No results for input(s): INR, PROTIME in the last 168 hours. Cardiac Enzymes: Recent Labs  Lab 11/07/18 0554  CKTOTAL 623*  CKMB 3.6   BNP (last 3 results) No results for input(s): PROBNP in the last 8760 hours. HbA1C: No results for input(s): HGBA1C in the last 72 hours. CBG: Recent Labs  Lab 11/09/18 0825  GLUCAP 94   Lipid Profile: No results for input(s): CHOL, HDL, LDLCALC, TRIG, CHOLHDL, LDLDIRECT in the last 72 hours. Thyroid Function Tests: Recent Labs    11/07/18 0554  TSH 1.577   Anemia Panel: Recent Labs    11/07/18 0600  VITAMINB12 270  FERRITIN 258  TIBC 304  IRON 179   Sepsis Labs: No results for input(s): PROCALCITON, LATICACIDVEN in the last 168 hours.  Recent Results (from the past 240 hour(s))  SARS Coronavirus 2 by RT PCR (hospital order, performed in [facility] hospital lab) Nasopharyngeal Nasopharyngeal Swab     Status: None   Collection Time: 11/06/18  6:01 PM   Specimen: Nasopharyngeal Swab  Result Value Ref Range Status   SARS Coronavirus 2 NEGATIVE NEGATIVE Final    Comment: (NOTE) If result is NEGATIVE SARS-CoV-2 target nucleic acids are NOT DETECTED. The SARS-CoV-2 RNA is generally detectable in upper and lower  respiratory specimens during the acute phase of infection. The lowest  concentration of SARS-CoV-2 viral copies this assay can detect is 250  copies / mL. A negative result does not preclude SARS-CoV-2 infection  and should not be used as the sole basis for treatment or other  patient management decisions.  A negative result may occur with  improper specimen collection / handling, submission of specimen other  than nasopharyngeal swab, presence of viral mutation(s) within the  areas targeted by this assay, and inadequate number of viral copies  (<250 copies / mL). A negative result must be combined with clinical  observations, patient history, and epidemiological information. If result is  POSITIVE SARS-CoV-2 target nucleic acids are DETECTED. The SARS-CoV-2 RNA is generally detectable in upper and lower  respiratory specimens dur ing the acute phase of infection.  Positive  results are indicative of active infection with SARS-CoV-2.  Clinical  correlation with patient history and other diagnostic information is  necessary to determine patient infection status.  Positive results do  not rule out bacterial infection or co-infection with other viruses. If result is PRESUMPTIVE POSTIVE SARS-CoV-2 nucleic acids MAY BE PRESENT.   A presumptive positive result was obtained on the submitted specimen  and confirmed on repeat testing.  While 2019 novel coronavirus  (SARS-CoV-2) nucleic acids may be present in the submitted sample  additional confirmatory testing may be necessary for epidemiological  and / or clinical management purposes  to differentiate between  SARS-CoV-2 and other Sarbecovirus currently known  to infect humans.  If clinically indicated additional testing with an alternate test  methodology [phone]) is advised. The SARS-CoV-2 RNA is generally  detectable in upper and lower respiratory sp ecimens during the acute  phase of infection. The expected result is Negative. Fact Sheet for Patients:  BoilerBrush.com.cy Fact Sheet for Healthcare Providers: https://pope.com/ This test is not yet approved or cleared by the [place] FDA and has been authorized for detection and/or diagnosis of SARS-CoV-2 by FDA under an Emergency Use Authorization (EUA).  This EUA will remain in effect (meaning this test can be used) for the duration of the COVID-19 declaration under Section 564(b)(1) of the Act, 21 U.S.C. section 360bbb-3(b)(1), unless the authorization is terminated or revoked sooner. Performed at [facility], [address] [place], [place] [address]   CSF culture     Status: None (Preliminary result)    Collection Time: 11/07/18  3:05 AM   Specimen: CSF; Cerebrospinal Fluid  Result Value Ref Range Status   Specimen Description CSF  Final   Special Requests TUBE 2  Final   Gram Stain   Final    CYTOSPIN SMEAR RARE WBC PRESENT, PREDOMINANTLY PMN NO ORGANISMS SEEN    Culture   Final    NO GROWTH 2 DAYS Performed at [facility], [address] [place], [place] [address]    Report Status PENDING  Incomplete  Surgical PCR screen     Status: None   Collection Time: 11/09/18  5:34 AM   Specimen: Nasal Mucosa; Nasal Swab  Result Value Ref Range Status   MRSA, PCR NEGATIVE NEGATIVE Final   Staphylococcus aureus NEGATIVE NEGATIVE Final    Comment: (NOTE) The Xpert SA Assay (FDA approved for NASAL specimens in patients 71 years of age and older), is one component of a comprehensive surveillance program. It is not intended to diagnose infection nor to guide or monitor treatment. Performed at [facility], [address] [place], [place] [address]       Radiology Studies: Dg Lumbar Spine 2-3 Views  Result Date: 11/09/2018 CLINICAL DATA:  Intraoperative localization EXAM: LUMBAR SPINE - 2-3 VIEW COMPARISON:  11/07/2018 FINDINGS: Lateral views of the lumbar spine were obtained. Initial image demonstrates needles within the posterior soft tissues at the L1 and L3 levels. Subsequent lateral few shows surgical retractors and instruments at the L2-3 level. IMPRESSION: Intraoperative localization at L2-3. Electronically Signed   By: Alcide Clever M.D.   On: 11/09/2018 12:56   Mr Laqueta Jean OI Contrast  Result Date: 11/07/2018 CLINICAL DATA:  Bilateral lower extremity weakness. EXAM: MRI HEAD WITHOUT AND WITH CONTRAST TECHNIQUE: Multiplanar, multiecho pulse sequences of the brain and surrounding structures were obtained without and with intravenous contrast. CONTRAST:  44mL GADAVIST GADOBUTROL 1 MMOL/ML IV SOLN COMPARISON:  Head CT yesterday FINDINGS: Brain: The brain has a normal  appearance without evidence of malformation, atrophy, old or acute small or large vessel infarction, mass lesion, hemorrhage, hydrocephalus or extra-axial collection. Vascular: Major vessels at the base of the brain show flow. Venous sinuses appear patent. Skull and upper cervical spine: Normal. Sinuses/Orbits: Mucosal thickening of the right maxillary sinus. Other sinuses clear. Other: Fluid in the posterior nasopharynx, reason not certain. IMPRESSION: Normal brain MRI. No abnormality seen to explain the clinical presentation. Electronically Signed   By: Paulina Fusi M.D.   On: 11/07/2018 17:40   Mr Thoracic Spine W Wo Contrast  Result Date: 11/07/2018 CLINICAL DATA:  Numbness, tingling and  paresthesia of the extremities. EXAM: MRI THORACIC WITHOUT AND WITH CONTRAST TECHNIQUE: Multiplanar and multiecho pulse sequences of the thoracic spine were obtained without and with intravenous contrast. CONTRAST:  29mL GADAVIST GADOBUTROL 1 MMOL/ML IV SOLN COMPARISON:  None. FINDINGS: MRI THORACIC SPINE FINDINGS Alignment:  Normal Vertebrae: Normal Cord:  No primary cord lesion.  See below regarding stenosis. Paraspinal and other soft tissues: Negative Disc levels: No abnormality from T1-2 through T4-5. T5-6: Minor left-sided disc bulge.  No compressive stenosis. T6-7: Normal. T7-8: Central to slightly right-sided disc herniation, narrowing the ventral subarachnoid space but not compressing the cord. No compressive foraminal narrowing. T8-9: Normal interspace. T9-10: Minor left-sided disc bulge.  No compressive stenosis. T10-11: Right paracentral disc bulge.  No compressive stenosis. T11-12 and T12-L1: Normal. IMPRESSION: Degenerative changes as outlined above. The only potentially symptomatic finding is at T7-8 where there is a central to right-sided disc herniation. This narrows the ventral subarachnoid space and contacts the ventral cord, but ample subarachnoid spaces present dorsal to the cord in there does not appear  to be any cord compression or abnormal cord signal. None the less, this finding could be symptomatic. Electronically Signed   By: Nelson Chimes M.D.   On: 11/07/2018 17:45   Mr Lumbar Spine W Wo Contrast  Result Date: 11/07/2018 CLINICAL DATA:  Lower extremity numbness. EXAM: MRI LUMBAR SPINE WITHOUT AND WITH CONTRAST TECHNIQUE: Multiplanar and multiecho pulse sequences of the lumbar spine were obtained without and with intravenous contrast. CONTRAST:  63mL GADAVIST GADOBUTROL 1 MMOL/ML IV SOLN COMPARISON:  11/04/2018 FINDINGS: Segmentation:  5 lumbar type vertebral bodies. Alignment:  Straightening of the normal lumbar lordosis. Vertebrae:  No fracture or primary bone lesion. Conus medullaris and cauda equina: Conus extends to the T12-L1 level. Conus and cauda equina appear normal. Paraspinal and other soft tissues: Distended bladder Disc levels: L1-2: Circumferential bulging of the disc. Mild stenosis without visible neural compression. L2-3: Broad-based disc herniation with caudal migration behind upper L3. Severe spinal stenosis with effacement of the subarachnoid space and likely nerve compression. L3-4: Bulging of the disc. Mild stenosis of both lateral recesses and foramina without definite neural compression. L4-5: Bulging of the disc more towards the left. Mild stenosis of both lateral recesses. Left foraminal narrowing with some potential for neural compression. L5-S1: Disc bulge. Mild facet hypertrophy. No compressive stenosis. 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 This is quite likely the significant lesion in this case. Disc bulges at L1-2, L3-4 and L4-5 as noted above with lateral recess and foraminal stenoses but no definite neural compression. Electronically Signed   By: Nelson Chimes M.D.   On: 11/07/2018 17:51      Scheduled Meds:  [MAR Hold] Chlorhexidine  Gluconate Cloth  6 each Topical Q0600   [MAR Hold]  HYDROmorphone (DILAUDID) injection  1 mg Intravenous Once   [MAR Hold] mupirocin ointment  1 application Nasal BID   Continuous Infusions:  lactated ringers 10 mL/hr at 11/08/18 1800     LOS: 1 day      Time spent: 25 minutes   Dessa Phi, DO [facility] 11/09/2018, 1:09 PM   Available via Epic secure chat 7am-7pm After these hours, please refer to coverage provider listed on [email or web address]

## 2018-11-09 NOTE — Progress Notes (Signed)
Patient transported for surgery, report given to RN. Patient belongings sent to security office.

## 2018-11-09 NOTE — Anesthesia Procedure Notes (Signed)
Procedure Name: Intubation Date/Time: 11/09/2018 11:38 AM Performed by: Babs Bertin, CRNA Pre-anesthesia Checklist: Patient identified, Emergency Drugs available, Suction available and Patient being monitored Patient Re-evaluated:Patient Re-evaluated prior to induction Oxygen Delivery Method: Circle System Utilized Preoxygenation: Pre-oxygenation with 100% oxygen Induction Type: IV induction Ventilation: Mask ventilation without difficulty Laryngoscope Size: Mac and 3 Grade View: Grade I Tube type: Oral Tube size: 7.5 mm Number of attempts: 1 Airway Equipment and Method: Stylet and Oral airway Placement Confirmation: ETT inserted through vocal cords under direct vision,  positive ETCO2 and breath sounds checked- equal and bilateral Secured at: 23 cm Tube secured with: Tape Dental Injury: Teeth and Oropharynx as per pre-operative assessment

## 2018-11-09 NOTE — Anesthesia Preprocedure Evaluation (Signed)
Anesthesia Evaluation  Patient identified by MRN, date of birth, ID band Patient awake    Reviewed: Allergy & Precautions, NPO status , Patient's Chart, lab work & pertinent test results  Airway Mallampati: I       Dental no notable dental hx. (+) Teeth Intact   Pulmonary Current Smoker and Patient abstained from smoking.,    Pulmonary exam normal breath sounds clear to auscultation       Cardiovascular negative cardio ROS Normal cardiovascular exam Rhythm:Regular Rate:Normal     Neuro/Psych  Neuromuscular disease negative psych ROS   GI/Hepatic negative GI ROS, Neg liver ROS,   Endo/Other  negative endocrine ROS  Renal/GU negative Renal ROS     Musculoskeletal   Abdominal Normal abdominal exam  (+) + obese,   Peds  Hematology negative hematology ROS (+)   Anesthesia Other Findings   Reproductive/Obstetrics                             Anesthesia Physical  Anesthesia Plan  ASA: II  Anesthesia Plan: General   Post-op Pain Management:    Induction: Intravenous  PONV Risk Score and Plan: 1 and Ondansetron  Airway Management Planned: Oral ETT  Additional Equipment: None  Intra-op Plan:   Post-operative Plan: Extubation in OR  Informed Consent: I have reviewed the patients History and Physical, chart, labs and discussed the procedure including the risks, benefits and alternatives for the proposed anesthesia with the patient or authorized representative who has indicated his/her understanding and acceptance.     Dental advisory given  Plan Discussed with: CRNA  Anesthesia Plan Comments:         Anesthesia Quick Evaluation

## 2018-11-09 NOTE — Plan of Care (Signed)
  Problem: Education: Goal: Knowledge of General Education information will improve Description Including pain rating scale, medication(s)/side effects and non-pharmacologic comfort measures Outcome: Progressing   Problem: Health Behavior/Discharge Planning: Goal: Ability to manage health-related needs will improve Outcome: Progressing   Problem: Clinical Measurements: Goal: Ability to maintain clinical measurements within normal limits will improve Outcome: Progressing Goal: Will remain free from infection Outcome: Progressing Goal: Diagnostic test results will improve Outcome: Progressing Goal: Respiratory complications will improve Outcome: Progressing Goal: Cardiovascular complication will be avoided Outcome: Progressing   Problem: Activity: Goal: Risk for activity intolerance will decrease Outcome: Progressing   Problem: Nutrition: Goal: Adequate nutrition will be maintained Outcome: Progressing   Problem: Coping: Goal: Level of anxiety will decrease Outcome: Progressing   Problem: Elimination: Goal: Will not experience complications related to bowel motility Outcome: Progressing Goal: Will not experience complications related to urinary retention Outcome: Progressing   Problem: Pain Managment: Goal: General experience of comfort will improve Outcome: Progressing   Problem: Safety: Goal: Ability to remain free from injury will improve Outcome: Progressing   Problem: Skin Integrity: Goal: Risk for impaired skin integrity will decrease Outcome: Progressing   Problem: Education: Goal: Ability to verbalize activity precautions or restrictions will improve Outcome: Progressing Goal: Knowledge of the prescribed therapeutic regimen will improve Outcome: Progressing Goal: Understanding of discharge needs will improve Outcome: Progressing   Problem: Activity: Goal: Ability to avoid complications of mobility impairment will improve Outcome: Progressing Goal:  Ability to tolerate increased activity will improve Outcome: Progressing Goal: Will remain free from falls Outcome: Progressing   Problem: Bowel/Gastric: Goal: Gastrointestinal status for postoperative course will improve Outcome: Progressing   Problem: Clinical Measurements: Goal: Ability to maintain clinical measurements within normal limits will improve Outcome: Progressing Goal: Postoperative complications will be avoided or minimized Outcome: Progressing Goal: Diagnostic test results will improve Outcome: Progressing   Problem: Pain Management: Goal: Pain level will decrease Outcome: Progressing   Problem: Skin Integrity: Goal: Will show signs of wound healing Outcome: Progressing   Problem: Health Behavior/Discharge Planning: Goal: Identification of resources available to assist in meeting health care needs will improve Outcome: Progressing   Problem: Bladder/Genitourinary: Goal: Urinary functional status for postoperative course will improve Outcome: Progressing   Zacary Bauer, BSN ,RN 

## 2018-11-09 NOTE — Progress Notes (Signed)
Pt's belongings have been returned to him from the security office. Yellow receipt sign has been signed by the pt and put in the pt's chart

## 2018-11-09 NOTE — Progress Notes (Signed)
Pt. Returned from PACU, report received from PACU RN. Pain 0/10. Surgical site assessed, covered with no drainage.

## 2018-11-09 NOTE — Progress Notes (Signed)
PT Cancellation Note  Patient Details Name: Joseph Reeves MRN: [id number] DOB: November 09, 1965   Cancelled Treatment:    Reason Eval/Treat Not Completed: Patient at procedure or test/unavailable (surgery).  Ellamae Sia, PT, DPT Acute Rehabilitation Services Pager [phone] Office [phone]    Willy Eddy 11/09/2018, 10:45 AM

## 2018-11-10 LAB — CBC
HCT: 31.2 % — ABNORMAL LOW (ref 39.0–52.0)
Hemoglobin: 10.7 g/dL — ABNORMAL LOW (ref 13.0–17.0)
MCH: 32.8 pg (ref 26.0–34.0)
MCHC: 34.3 g/dL (ref 30.0–36.0)
MCV: 95.7 fL (ref 80.0–100.0)
Platelets: 185 10*3/uL (ref 150–400)
RBC: 3.26 MIL/uL — ABNORMAL LOW (ref 4.22–5.81)
RDW: 11.8 % (ref 11.5–15.5)
WBC: 8.6 10*3/uL (ref 4.0–10.5)
nRBC: 0 % (ref 0.0–0.2)

## 2018-11-10 LAB — BASIC METABOLIC PANEL
Anion gap: 7 (ref 5–15)
BUN: 17 mg/dL (ref 6–20)
CO2: 28 mmol/L (ref 22–32)
Calcium: 8.6 mg/dL — ABNORMAL LOW (ref 8.9–10.3)
Chloride: 103 mmol/L (ref 98–111)
Creatinine, Ser: 1.18 mg/dL (ref 0.61–1.24)
GFR calc Af Amer: >60 mL/min (ref >60)
GFR calc non Af Amer: >60 mL/min (ref >60)
Glucose, Bld: 140 mg/dL — ABNORMAL HIGH (ref 70–99)
Potassium: 4.2 mmol/L (ref 3.5–5.1)
Sodium: 138 mmol/L (ref 135–145)

## 2018-11-10 LAB — CSF CULTURE W GRAM STAIN: Culture: NO GROWTH

## 2018-11-10 MED FILL — Sodium Chloride IV Soln 0.9%: INTRAVENOUS | Qty: 1000 | Status: AC

## 2018-11-10 MED FILL — Heparin Sodium (Porcine) Inj 1000 Unit/ML: INTRAMUSCULAR | Qty: 30 | Status: AC

## 2018-11-10 NOTE — Progress Notes (Signed)
Patient not safe to ambulate according to PT.

## 2018-11-10 NOTE — Consult Note (Signed)
Inpatient Rehab Admissions:  Inpatient Rehab Consult received.  I met with pt at the bedside for rehabilitation assessment. Pt appears to be a great candidate for CIR. He reports [facility] and reports he has great family support at [place]. Discussed estimated daily cost of care as pt shown as uninsured. Pt has given me permission to contact financial counseling on his behalf. Pt is to discuss with family this weekend and provide [facility] with designated caregiver and housing situation at [place] as pt is from [place] and unsure if he will be able to go directly home due to current injury. Will follow up with pt Monday.   Please call if questions.   Jhonnie Garner, OTR/L  Rehab Admissions Coordinator  [phone] 11/10/2018 3:59 PM

## 2018-11-10 NOTE — Plan of Care (Signed)
  Problem: Education: Goal: Knowledge of General Education information will improve Description Including pain rating scale, medication(s)/side effects and non-pharmacologic comfort measures Outcome: Progressing   Problem: Health Behavior/Discharge Planning: Goal: Ability to manage health-related needs will improve Outcome: Progressing   Problem: Clinical Measurements: Goal: Ability to maintain clinical measurements within normal limits will improve Outcome: Progressing Goal: Will remain free from infection Outcome: Progressing Goal: Diagnostic test results will improve Outcome: Progressing Goal: Respiratory complications will improve Outcome: Progressing Goal: Cardiovascular complication will be avoided Outcome: Progressing   Problem: Activity: Goal: Risk for activity intolerance will decrease Outcome: Progressing   Problem: Nutrition: Goal: Adequate nutrition will be maintained Outcome: Progressing   Problem: Coping: Goal: Level of anxiety will decrease Outcome: Progressing   Problem: Elimination: Goal: Will not experience complications related to bowel motility Outcome: Progressing Goal: Will not experience complications related to urinary retention Outcome: Progressing   Problem: Pain Managment: Goal: General experience of comfort will improve Outcome: Progressing   Problem: Safety: Goal: Ability to remain free from injury will improve Outcome: Progressing   Problem: Skin Integrity: Goal: Risk for impaired skin integrity will decrease Outcome: Progressing   Problem: Education: Goal: Ability to verbalize activity precautions or restrictions will improve Outcome: Progressing Goal: Knowledge of the prescribed therapeutic regimen will improve Outcome: Progressing Goal: Understanding of discharge needs will improve Outcome: Progressing   Problem: Activity: Goal: Ability to avoid complications of mobility impairment will improve Outcome: Progressing Goal:  Ability to tolerate increased activity will improve Outcome: Progressing Goal: Will remain free from falls Outcome: Progressing   Problem: Bowel/Gastric: Goal: Gastrointestinal status for postoperative course will improve Outcome: Progressing   Problem: Clinical Measurements: Goal: Ability to maintain clinical measurements within normal limits will improve Outcome: Progressing Goal: Postoperative complications will be avoided or minimized Outcome: Progressing Goal: Diagnostic test results will improve Outcome: Progressing   Problem: Pain Management: Goal: Pain level will decrease Outcome: Progressing   Problem: Skin Integrity: Goal: Will show signs of wound healing Outcome: Progressing   Problem: Health Behavior/Discharge Planning: Goal: Identification of resources available to assist in meeting health care needs will improve Outcome: Progressing   Problem: Bladder/Genitourinary: Goal: Urinary functional status for postoperative course will improve Outcome: Progressing   Eiko Mcgowen, BSN ,RN 

## 2018-11-10 NOTE — Progress Notes (Signed)
Vitals:   11/09/18 2005 11/10/18 0048 11/10/18 0442 11/10/18 0515  BP: 139/72 133/70 (!) 146/86   Pulse: 82 75 61   Resp: 17 16 16    Temp: 98.5 F (36.9 C) 98.5 F (36.9 C) 98.9 F (37.2 C)   TempSrc: Oral Oral Oral   SpO2: 98% 98% 100%   Weight:    107.6 kg  Height:        CBC Recent Labs    11/08/18 0611 11/10/18 0303  WBC 8.7 8.6  HGB 11.5* 10.7*  HCT 35.2* 31.2*  PLT 207 185   BMET Recent Labs    11/08/18 0402 11/10/18 0303  NA 140 138  K 4.0 4.2  CL 106 103  CO2 26 28  GLUCOSE 128* 140*  BUN 22* 17  CREATININE 1.08 1.18  CALCIUM 8.6* 8.6*    Patient resting in bed, comfortable.  Has not been immobilized since Aspen lumbar corset that I ordered over 36 hours ago still has not arrived.  Have instructed his nurse Jerene Pitch) to progressed to mobilization without the brace, until it arrives, then begin to use it.  Exam today shows encouraging recovery of movement in feet, dorsiflexor and plantar flexor are 2/5 bilaterally, with left greater movement than right.  Plan: Awaiting PT and OT to resume.  Awaiting Aspen lumbar corset (ordered over 36 hours ago).  It is certainly encouraging to see some recovery already of dorsiflexion and plantarflexion.  We are leaving the Foley catheter into continue bladder rest due to presenting neurogenic urinary retention.  Have consulted PM&R regarding comprehensive inpatient rehabilitation (CIR) due to paraparesis presents with impairments of transfers, ambulation, ADLs, etc. and neurogenic urinary retention and the need for bladder training.  Have discussed case with Silvestre Mesi, PA for inpatient rehab.  Stable from neurosurgical perspective for transfer to rehab.  Hosie Spangle, MD 11/10/2018, 7:49 AM

## 2018-11-10 NOTE — Progress Notes (Signed)
PROGRESS NOTE    Joseph Reeves  ZOX:[id number] DOB: 04/27/1965 DOA: 11/06/2018 PCP: Patient, No Pcp Per     Brief Narrative:  Joseph Reeves is a 53 year old male who lives in [place] and visiting [place] due to work presents with numbness and tingling of bilateral legs since Monday.  Symptoms have progressed to include weakness and significantly getting worse.  He has been evaluated by emergency department 3 times in the past week.  Patient underwent lumbar puncture which resulted in a bloody tap.  He also underwent MRI of thoracic and lumbar spine.  Neurosurgery and neurology consulted.  Joseph events last 24 hours / Subjective: Feeling well overall, recovering well postoperatively.  Continues to have some tingling in his lower extremities, but able to move his feet much more than prior to surgery.  Assessment & Plan:   Principal Problem:   Leg weakness Active Problems:   Numbness and tingling of both legs   Numbness, tingling, weakness of the bilateral lower extremities secondary to L2-L3 disc herniation -Neurology evaluated patient during hospitalization and has recommended further neurosurgery evaluation. -Status post L2-L3 decompression and laminectomy 10/22 by Dr. Newell CoralNudelman -PT OT recommending CIR -Continue Foley catheter to continue bladder rest due to neurogenic urinary retention on presentation    DVT prophylaxis: SCD Code Status: Full code Family Communication: No family at bedside Disposition Plan: CIR evaluation pending   Consultants:   Neurology  Neurosurgery  Procedures:   None  Antimicrobials:  Anti-infectives (From admission, onward)   Start     Dose/Rate Route Frequency Ordered Stop   11/09/18 1107  bacitracin 50,000 Units in sodium chloride 0.9 % 500 mL irrigation  Status:  Discontinued       As needed 11/09/18 1107 11/09/18 1604       Objective: Vitals:   11/10/18 0048 11/10/18 0442 11/10/18 0515 11/10/18 0850  BP: 133/70 (!)  146/86  138/82  Pulse: 75 61  79  Resp: 16 16  20   Temp: 98.5 F (36.9 C) 98.9 F (37.2 C)  99.1 F (37.3 C)  TempSrc: Oral Oral  Oral  SpO2: 98% 100%  100%  Weight:   107.6 kg   Height:        Intake/Output Summary (Last 24 hours) at 11/10/2018 1049 Last data filed at 11/10/2018 0851 Gross per 24 hour  Intake 2018 ml  Output 3125 ml  Net -1107 ml   Filed Weights   11/08/18 0428 11/09/18 1028 11/10/18 0515  Weight: 108.5 kg 108.5 kg 107.6 kg    Examination: General exam: Appears calm and comfortable  Respiratory system: Clear to auscultation. Respiratory effort normal. Cardiovascular system: S1 & S2 heard, RRR. No pedal edema. Gastrointestinal system: Abdomen is nondistended, soft and nontender. Normal bowel sounds heard. Central nervous system: Alert and oriented. Speech clear  Extremities: Symmetric in appearance bilaterally  Skin: No rashes, lesions or ulcers on exposed skin  Psychiatry: Judgement and insight appear stable. Mood & affect appropriate.     Data Reviewed: I have personally reviewed following labs and imaging studies  CBC: Recent Labs  Lab 11/06/18 1651 11/07/18 0554 11/07/18 1854 11/08/18 0611 11/10/18 0303  WBC 13.5* 9.2  --  8.7 8.6  NEUTROABS 11.2*  --   --   --   --   HGB 12.7* 13.4  --  11.5* 10.7*  HCT 39.4 43.7 38.1 35.2* 31.2*  MCV 97.8 108.4*  --  98.1 95.7  PLT 261 236  --  207 185   Basic Metabolic  Panel: Recent Labs  Lab 11/06/18 1651 11/07/18 0554 11/08/18 0402 11/10/18 0303  NA 141 143 140 138  K 3.7 3.9 4.0 4.2  CL 102 109 106 103  CO2 27 21* 26 28  GLUCOSE 130* 111* 128* 140*  BUN 27* 25* 22* 17  CREATININE 1.11 1.11 1.08 1.18  CALCIUM 9.1 9.0 8.6* 8.6*   GFR: Estimated Creatinine Clearance: 94.3 mL/min (by C-G formula based on SCr of 1.18 mg/dL). Liver Function Tests: Recent Labs  Lab 11/06/18 1651 11/07/18 0554 11/08/18 0402  AST 26 21 16   ALT 22 19 18   ALKPHOS 35* 29* 31*  BILITOT 0.5 QUANTITY NOT  SUFFICIENT, UNABLE TO PERFORM TEST 0.9  PROT 7.7 6.7 6.0*  ALBUMIN 4.4 3.8 3.4*   Recent Labs  Lab 11/06/18 1651  LIPASE 22   No results for input(s): AMMONIA in the last 168 hours. Coagulation Profile: No results for input(s): INR, PROTIME in the last 168 hours. Cardiac Enzymes: Recent Labs  Lab 11/07/18 0554  CKTOTAL 623*  CKMB 3.6   BNP (last 3 results) No results for input(s): PROBNP in the last 8760 hours. HbA1C: No results for input(s): HGBA1C in the last 72 hours. CBG: Recent Labs  Lab 11/09/18 0825  GLUCAP 94   Lipid Profile: No results for input(s): CHOL, HDL, LDLCALC, TRIG, CHOLHDL, LDLDIRECT in the last 72 hours. Thyroid Function Tests: No results for input(s): TSH, T4TOTAL, FREET4, T3FREE, THYROIDAB in the last 72 hours. Anemia Panel: No results for input(s): VITAMINB12, FOLATE, FERRITIN, TIBC, IRON, RETICCTPCT in the last 72 hours. Sepsis Labs: No results for input(s): PROCALCITON, LATICACIDVEN in the last 168 hours.  Recent Results (from the past 240 hour(s))  SARS Coronavirus 2 by RT PCR (hospital order, performed in [facility] hospital lab) Nasopharyngeal Nasopharyngeal Swab     Status: None   Collection Time: 11/06/18  6:01 PM   Specimen: Nasopharyngeal Swab  Result Value Ref Range Status   SARS Coronavirus 2 NEGATIVE NEGATIVE Final    Comment: (NOTE) If result is NEGATIVE SARS-CoV-2 target nucleic acids are NOT DETECTED. The SARS-CoV-2 RNA is generally detectable in upper and lower  respiratory specimens during the acute phase of infection. The lowest  concentration of SARS-CoV-2 viral copies this assay can detect is 250  copies / mL. A negative result does not preclude SARS-CoV-2 infection  and should not be used as the sole basis for treatment or other  patient management decisions.  A negative result may occur with  improper specimen collection / handling, submission of specimen other  than nasopharyngeal swab, presence of viral mutation(s)  within the  areas targeted by this assay, and inadequate number of viral copies  (<250 copies / mL). A negative result must be combined with clinical  observations, patient history, and epidemiological information. If result is POSITIVE SARS-CoV-2 target nucleic acids are DETECTED. The SARS-CoV-2 RNA is generally detectable in upper and lower  respiratory specimens dur ing the acute phase of infection.  Positive  results are indicative of active infection with SARS-CoV-2.  Clinical  correlation with patient history and other diagnostic information is  necessary to determine patient infection status.  Positive results do  not rule out bacterial infection or co-infection with other viruses. If result is PRESUMPTIVE POSTIVE SARS-CoV-2 nucleic acids MAY BE PRESENT.   A presumptive positive result was obtained on the submitted specimen  and confirmed on repeat testing.  While 2019 novel coronavirus  (SARS-CoV-2) nucleic acids may be present in the submitted sample  additional confirmatory testing may be necessary for epidemiological  and / or clinical management purposes  to differentiate between  SARS-CoV-2 and other Sarbecovirus currently known to infect humans.  If clinically indicated additional testing with an alternate test  methodology [phone]) is advised. The SARS-CoV-2 RNA is generally  detectable in upper and lower respiratory sp ecimens during the acute  phase of infection. The expected result is Negative. Fact Sheet for Patients:  BoilerBrush.com.cy Fact Sheet for Healthcare Providers: https://pope.com/ This test is not yet approved or cleared by the [place] FDA and has been authorized for detection and/or diagnosis of SARS-CoV-2 by FDA under an Emergency Use Authorization (EUA).  This EUA will remain in effect (meaning this test can be used) for the duration of the COVID-19 declaration under Section 564(b)(1) of the Act,  21 U.S.C. section 360bbb-3(b)(1), unless the authorization is terminated or revoked sooner. Performed at [facility], [address] [place], [place] [address]   CSF culture     Status: None   Collection Time: 11/07/18  3:05 AM   Specimen: CSF; Cerebrospinal Fluid  Result Value Ref Range Status   Specimen Description CSF  Final   Special Requests TUBE 2  Final   Gram Stain   Final    CYTOSPIN SMEAR RARE WBC PRESENT, PREDOMINANTLY PMN NO ORGANISMS SEEN    Culture   Final    NO GROWTH 3 DAYS Performed at [facility], [address] [place], [place] [address]    Report Status 11/10/2018 FINAL  Final  Surgical PCR screen     Status: None   Collection Time: 11/09/18  5:34 AM   Specimen: Nasal Mucosa; Nasal Swab  Result Value Ref Range Status   MRSA, PCR NEGATIVE NEGATIVE Final   Staphylococcus aureus NEGATIVE NEGATIVE Final    Comment: (NOTE) The Xpert SA Assay (FDA approved for NASAL specimens in patients 1 years of age and older), is one component of a comprehensive surveillance program. It is not intended to diagnose infection nor to guide or monitor treatment. Performed at [facility] [place] [facility], [address], [place], [place] [address]       Radiology Studies: Dg Lumbar Spine 2-3 Views  Result Date: 11/09/2018 CLINICAL DATA:  L2-3 PLIF EXAM: DG C-ARM 1-60 MIN; LUMBAR SPINE - 2-3 VIEW COMPARISON:  Intraoperative films from earlier in the same day. FLUOROSCOPY TIME:  Fluoroscopy Time:  48 seconds Radiation Exposure Index (if provided by the fluoroscopic device): Not available Number of Acquired Spot Images: 2 FINDINGS: Interbody fusion at L2-3 is noted with pedicle screws and posterior fixation. IMPRESSION: L2-3 fusion Electronically Signed   By: Alcide Clever M.D.   On: 11/09/2018 15:43   Dg Lumbar Spine 2-3 Views  Result Date: 11/09/2018 CLINICAL DATA:  Intraoperative localization EXAM: LUMBAR SPINE - 2-3 VIEW COMPARISON:  11/07/2018 FINDINGS:  Lateral views of the lumbar spine were obtained. Initial image demonstrates needles within the posterior soft tissues at the L1 and L3 levels. Subsequent lateral few shows surgical retractors and instruments at the L2-3 level. IMPRESSION: Intraoperative localization at L2-3. Electronically Signed   By: Alcide Clever M.D.   On: 11/09/2018 12:56   Dg C-arm 1-60 Min  Result Date: 11/09/2018 CLINICAL DATA:  L2-3 PLIF EXAM: DG C-ARM 1-60 MIN; LUMBAR SPINE - 2-3 VIEW COMPARISON:  Intraoperative films from earlier in the same day. FLUOROSCOPY TIME:  Fluoroscopy Time:  48 seconds Radiation Exposure Index (if provided by the fluoroscopic device): Not available  Number of Acquired Spot Images: 2 FINDINGS: Interbody fusion at L2-3 is noted with pedicle screws and posterior fixation. IMPRESSION: L2-3 fusion Electronically Signed   By: Alcide Clever M.D.   On: 11/09/2018 15:43      Scheduled Meds: . Chlorhexidine Gluconate Cloth  6 each Topical Q0600  . mupirocin ointment  1 application Nasal BID   Continuous Infusions:    LOS: 2 days      Time spent: 25 minutes   Noralee Stain, DO [facility] 11/10/2018, 10:49 AM   Available via Epic secure chat 7am-7pm After these hours, please refer to coverage provider listed on [email or web address]

## 2018-11-10 NOTE — Progress Notes (Signed)
Pt not ambulated per order d/t lumbar corset not being available. Ortho tech paged at 0430, "device to be ordered and should arrive around 0900".

## 2018-11-10 NOTE — Progress Notes (Signed)
Inpatient Rehab Admissions:  Inpatient Rehab Consult received. Will follow for post-op therapy evaluations prior to meeting with pt to discuss post acute therapy needs.   Please call if questions.   Jhonnie Garner, OTR/L  Rehab Admissions Coordinator  [phone] 11/10/2018 9:44 AM

## 2018-11-10 NOTE — Evaluation (Signed)
Physical Therapy RE-Evaluation Patient Details Name: Joseph Reeves MRN: [id number] DOB: 03/23/1965 Today's Date: 11/10/2018   History of Present Illness  Pt is a 53 y.o. M with no significant PMH of presents with LBP radiating down BLE with associated weakness and sensory symptoms. MRI brain negative. No transverse myelitis on T-spine MRI. 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 Pt s/p L2-3 decompression and fusion on 11/09/18.    Clinical Impression  Pt s/p lumbar decompression and fusion.  Continue to await lumbar brace, however, Dr. Antony ContrasNudleman's note indicated ok to mobilize without it.  Pt continues to have R LE weakness and numbness > L LE weakness and numbness, but there is some firing of the R ankle DF/PF that was not present before.  He was able to stand with the standing frame and mobilize OOB to the recliner chair.  I do not believe he would be safe yet to do this with the RW, but hopeful to progress quickly to RW for gait (second person needed initially for safety).  He would be an excellent inpatient rehab candidate and is open to considering this option.   PT to follow acutely for deficits listed below.    Follow Up Recommendations CIR    Equipment Recommendations  Rolling walker with 5" wheels;3in1 (PT)    Recommendations for Other Services Rehab consult     Precautions / Restrictions Precautions Precautions: Back;Fall Precaution Booklet Issued: No Precaution Comments: verbally reviewed back precautions, R LE weakness Required Braces or Orthoses: Spinal Brace Spinal Brace: Lumbar corset;Applied in standing position Restrictions Weight Bearing Restrictions: No      Mobility  Bed Mobility Overal bed mobility: Needs Assistance Bed Mobility: Rolling;Sidelying to Sit Rolling: Min assist Sidelying to sit: Mod assist       General bed mobility comments: Min assist to help position left leg in flexion to log roll to the right.  Cues for log roll  technique and back precautions prior to mobilizing.  Most assist needed to progress bil legs over side of bed and support trunk to come up to sitting from side lying.  Heavy reliance on railing for support.   Transfers Overall transfer level: Needs assistance   Transfers: Sit to/from Stand Sit to Stand: Mod assist;From elevated surface         General transfer comment: Mod assist to stand x2 from elevated bed and x1 from low recliner chair (to assess if he could do it to return to bed or would need a lift pad).  Pt needed cues to extend at hips and trunk and attempt to push through his legs (limiting the straining of his UEs)  Ambulation/Gait             General Gait Details: unable at this time.  Would be safer once he transitions to RW to stand and with second person assist.  R leg remains very weak.       Balance Overall balance assessment: Needs assistance Sitting-balance support: Feet supported;Bilateral upper extremity supported Sitting balance-Leahy Scale: Fair Sitting balance - Comments: Pt unweighting his back in sitting with bil UEs.  Supervision EOB.    Standing balance support: Bilateral upper extremity supported Standing balance-Leahy Scale: Poor Standing balance comment: once standing in steady, no physical support needed, however, steady blocking both knees and pt using UEs for support.  Pertinent Vitals/Pain Pain Assessment: Faces Faces Pain Scale: Hurts whole lot Pain Location: incisional low bacl Pain Descriptors / Indicators: Aching Pain Intervention(s): Limited activity within patient's tolerance;Monitored during session;Repositioned    Home Living Family/patient expects to be discharged to:: Private residence Living Arrangements: Spouse/significant other Available Help at Discharge: Family Type of Home: House Home Access: Stairs to enter Entrance Stairs-Rails: None Secretary/administrator of Steps: 2 Home  Layout: Multi-level Home Equipment: Cane - single point      Prior Function Level of Independence: Independent         Comments: is from [place], was here for a class for work, likes to fish, ride his bike and motorcycle ("anything outside")        Extremity/Trunk Assessment   Upper Extremity Assessment Upper Extremity Assessment: Defer to OT evaluation    Lower Extremity Assessment Lower Extremity Assessment: RLE deficits/detail RLE Deficits / Details: right leg more impaired than L leg with R ankle 1/5 DF, knee 2/5, hip 2/5 RLE Sensation: decreased light touch(can localize to LT) LLE Deficits / Details: strength at least 3/5 throughout.   LLE Sensation: decreased light touch(deminished, but better than R, can localize)    Cervical / Trunk Assessment Cervical / Trunk Assessment: Other exceptions Cervical / Trunk Exceptions: post op lumbar surgery  Communication   Communication: No difficulties  Cognition Arousal/Alertness: Awake/alert Behavior During Therapy: WFL for tasks assessed/performed Overall Cognitive Status: Within Functional Limits for tasks assessed                                               Assessment/Plan    PT Assessment Patient needs continued PT services  PT Problem List Decreased strength;Decreased activity tolerance;Decreased balance;Decreased mobility;Decreased knowledge of use of DME;Decreased knowledge of precautions;Impaired sensation;Pain       PT Treatment Interventions DME instruction;Gait training;Stair training;Functional mobility training;Therapeutic exercise;Therapeutic activities;Balance training;Patient/family education    PT Goals (Current goals can be found in the Care Plan section)  Acute Rehab PT Goals Patient Stated Goal: to get back to his normal active lifestyle PT Goal Formulation: With patient Time For Goal Achievement: 11/24/18 Potential to Achieve Goals: Good    Frequency Min 5X/week         AM-PAC PT "6 Clicks" Mobility  Outcome Measure Help needed turning from your back to your side while in a flat bed without using bedrails?: A Little Help needed moving from lying on your back to sitting on the side of a flat bed without using bedrails?: A Lot Help needed moving to and from a bed to a chair (including a wheelchair)?: A Lot Help needed standing up from a chair using your arms (e.g., wheelchair or bedside chair)?: A Lot Help needed to walk in hospital room?: Total Help needed climbing 3-5 steps with a railing? : Total 6 Click Score: 11    End of Session   Activity Tolerance: Patient limited by pain Patient left: in chair;with call bell/phone within reach Nurse Communication: Mobility status;Need for lift equipment PT Visit Diagnosis: Muscle weakness (generalized) (M62.81);Difficulty in walking, not elsewhere classified (R26.2);Pain Pain - Right/Left: (lower) Pain - part of body: (back)    Time: 2330-0762 PT Time Calculation (min) (ACUTE ONLY): 39 min   Charges:        Lurena Joiner B. Jammie Clink, PT, DPT  Acute Rehabilitation [phone] pager [phone]) [phone] office  @ [facility]  [place]: [phone]   PT Evaluation $PT Re-evaluation: 1 Re-eval PT Treatments $Therapeutic Activity: 23-37 mins       11/10/2018, 11:30 AM

## 2018-11-10 NOTE — Evaluation (Signed)
Occupational Therapy Evaluation Patient Details Name: Joseph Reeves MRN: [id number] DOB: [birth year] Today's Date: 11/10/2018    History of Present Illness Pt is a 53 y.o. M with no significant PMH of presents with LBP radiating down BLE with associated weakness and sensory symptoms. MRI brain negative. No transverse myelitis on T-spine MRI. 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 Pt s/p L2-3 decompression and fusion on 11/09/18.     Clinical Impression   Patient presenting with decreased I in self care, functional mobility/transfers, balance, endurance, and safety awareness.  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 Patient currently functioning at max A sit <>stand, stedy for transfers, min A UB , max - total A LB. Pt able to utilize figure four position with assistance. Patient will benefit from acute OT to increase overall independence in the areas of ADLs, functional mobility, and safety in order to safely discharge to next venue of care to address functional deficits.    Follow Up Recommendations  CIR       Recommendations for Other Services Rehab consult     Precautions / Restrictions Precautions Precautions: Back;Fall Precaution Booklet Issued: No Precaution Comments: verbally reviewed back precautions, R LE weakness Required Braces or Orthoses: Spinal Brace Spinal Brace: Lumbar corset;Applied in standing position Restrictions Weight Bearing Restrictions: No      Mobility Bed Mobility Overal bed mobility: Needs Assistance Bed Mobility: Rolling;Sidelying to Sit Rolling: Min assist Sidelying to sit: Mod assist       General bed mobility comments: seated in recliner chair upon entering the room  Transfers Overall transfer level: Needs assistance Equipment used: None Transfers: Sit to/from Stand Sit to Stand: Mod assist;From elevated surface;Max assist         General transfer comment: mod A sit  <>stand into stedy and max A sit <>stand with OT standing in front of him    Balance Overall balance assessment: Needs assistance Sitting-balance support: Feet supported;Bilateral upper extremity supported Sitting balance-Leahy Scale: Fair Sitting balance - Comments: Pt unweighting his back in sitting with bil UEs.  Supervision EOB.    Standing balance support: Bilateral upper extremity supported Standing balance-Leahy Scale: Poor Standing balance comment: once standing in steady, no physical support needed, however, steady blocking both knees and pt using UEs for support.                            ADL either performed or assessed with clinical judgement   ADL Overall ADL's : Needs assistance/impaired Eating/Feeding: Independent   Grooming: Wash/dry hands;Wash/dry face;Oral care;Set up;Sitting   Upper Body Bathing: Set up;Sitting   Lower Body Bathing: Moderate assistance;Sitting/lateral leans   Upper Body Dressing : Minimal assistance;Sitting   Lower Body Dressing: Maximal assistance;Total assistance     Toilet Transfer Details (indicate cue type and reason): stedy lift                 Vision Baseline Vision/History: No visual deficits              Pertinent Vitals/Pain Pain Assessment: Faces Faces Pain Scale: Hurts little more Pain Location: incisional low bacl Pain Descriptors / Indicators: Aching Pain Intervention(s): Limited activity within patient's tolerance;Monitored during session;Repositioned     Hand Dominance Right   Extremity/Trunk Assessment Upper Extremity Assessment Upper Extremity Assessment: Overall WFL for tasks assessed   Lower Extremity Assessment Lower Extremity Assessment: RLE deficits/detail RLE Deficits / Details: right leg  more impaired than L leg with R ankle 1/5 DF, knee 2/5, hip 2/5 RLE Sensation: decreased light touch RLE Coordination: decreased gross motor LLE Deficits / Details: strength at least 3/5 throughout.    LLE Sensation: decreased light touch   Cervical / Trunk Assessment Cervical / Trunk Assessment: Other exceptions Cervical / Trunk Exceptions: post op lumbar surgery   Communication Communication Communication: No difficulties   Cognition Arousal/Alertness: Awake/alert Behavior During Therapy: WFL for tasks assessed/performed Overall Cognitive Status: Within Functional Limits for tasks assessed                    Home Living Family/patient expects to be discharged to:: Private residence Living Arrangements: Spouse/significant other Available Help at Discharge: Family Type of Home: House Home Access: Stairs to enter Technical brewer of Steps: 2 Entrance Stairs-Rails: None Home Layout: Multi-level Alternate Level Stairs-Number of Steps: flight Alternate Level Stairs-Rails: Right           Home Equipment: Cane - single point          Prior Functioning/Environment Level of Independence: Independent        Comments: is from [place], was here for a class for work, likes to fish, ride his bike and motorcycle ("anything outside")        OT Problem List: Decreased strength;Decreased coordination;Pain;Decreased activity tolerance;Decreased safety awareness;Impaired balance (sitting and/or standing);Decreased knowledge of use of DME or AE;Decreased knowledge of precautions      OT Treatment/Interventions: Self-care/ADL training;Therapeutic exercise;Therapeutic activities;Cognitive remediation/compensation;Neuromuscular education;Energy conservation;DME and/or AE instruction;Patient/family education;Balance training    OT Goals(Current goals can be found in the care plan section) Acute Rehab OT Goals Patient Stated Goal: to get better and be my normal self again OT Goal Formulation: With patient Time For Goal Achievement: 11/24/18 Potential to Achieve Goals: Good ADL Goals Pt Will Perform Upper Body Bathing: with supervision Pt Will Perform Lower Body Bathing:  with min assist Pt Will Perform Upper Body Dressing: with supervision Pt Will Perform Lower Body Dressing: with min assist Pt Will Transfer to Toilet: with min assist Pt Will Perform Toileting - Clothing Manipulation and hygiene: with min assist Pt Will Perform Tub/Shower Transfer: with min assist  OT Frequency: Min 2X/week   Barriers to D/C: (home in [place])             AM-PAC OT "6 Clicks" Daily Activity     Outcome Measure Help from another person eating meals?: None Help from another person taking care of personal grooming?: A Little Help from another person toileting, which includes using toliet, bedpan, or urinal?: Total Help from another person bathing (including washing, rinsing, drying)?: A Lot Help from another person to put on and taking off regular upper body clothing?: A Little Help from another person to put on and taking off regular lower body clothing?: Total 6 Click Score: 14   End of Session Equipment Utilized During Treatment: Other (comment)(stedy) Nurse Communication: Mobility status  Activity Tolerance: Patient tolerated treatment well Patient left: in chair;with call bell/phone within reach;with chair alarm set  OT Visit Diagnosis: Unsteadiness on feet (R26.81);Muscle weakness (generalized) (M62.81);Pain Pain - part of body: (back)                Time: 2979-8921 OT Time Calculation (min): 19 min Charges:  OT General Charges $OT Visit: 1 Visit OT Evaluation $OT Eval Moderate Complexity: 1 Mod   Jonee Lamore P, MS, OTR/L 11/10/2018, 1:08 PM

## 2018-11-10 NOTE — Progress Notes (Signed)
Lumbar corset has not arrived. Paged Ortho tech, waiting for response.

## 2018-11-10 NOTE — Anesthesia Postprocedure Evaluation (Signed)
Anesthesia Post Note  Patient: Joseph Reeves  Procedure(s) Performed: Lumbar two-three Decompression Posterior Lumbar Interbody Fusion , Posterior Lateral Arthrodesis (N/A Back)     Patient location during evaluation: PACU Anesthesia Type: General Level of consciousness: awake and alert Pain management: pain level controlled Vital Signs Assessment: post-procedure vital signs reviewed and stable Respiratory status: spontaneous breathing, nonlabored ventilation, respiratory function stable and patient connected to nasal cannula oxygen Cardiovascular status: blood pressure returned to baseline and stable Postop Assessment: no apparent nausea or vomiting Anesthetic complications: no    Last Vitals:  Vitals:   11/10/18 0850 11/10/18 1208  BP: 138/82 137/74  Pulse: 79 92  Resp: 20 17  Temp: 37.3 C 37.2 C  SpO2: 100% 98%    Last Pain:  Vitals:   11/10/18 1232  TempSrc:   PainSc: 2                  Tiajuana Amass

## 2018-11-10 NOTE — PMR Pre-admission (Signed)
PMR Admission Coordinator Pre-Admission Assessment  Patient: Joseph Reeves is an 53 y.o., male MRN: [id number] DOB: 08/30/1965 Height: 6' 1"  (185.4 cm) Weight: 107.7 kg  Insurance Information HMO:     PPO:      PCP:      IPA:      80/20:      OTHER:  PRIMARY: Uninsured (self pay)      Policy#:       Subscriber:  CM Name:       Phone#:      Fax#:  Pre-Cert#:       Employer: Benefits:  Phone #:      Name:  Eff. Date:     Deduct:       Out of Pocket Max:       Life Max:  CIR: pt is aware of estimated daily cost of care ($3,500). I have contacted financial counselor Shanon Rosser to screen for Medicaid/disability.      SNF:  Outpatient:      Co-Pay:  Home Health:       Co-Pay:  DME:      Co-Pay:  Providers:  SECONDARY:       Policy#:       Subscriber:  CM Name:       Phone#:      Fax#:  Pre-Cert#:       Employer:  Benefits:  Phone #:      Name:  Eff. Date:      Deduct:       Out of Pocket Max:       Life Max:  CIR:       SNF:  Outpatient:      Co-Pay: Home Health:       Co-Pay:  DME:      Co-Pay:   Medicaid Application Date:       Case Manager:  Disability Application Date:       Case Worker:   The "Data Collection Information Summary" for patients in Inpatient Rehabilitation Facilities with attached "Privacy Act [place] Records" was provided and verbally reviewed with: N/A  Emergency Contact Information Contact Information    Name Relation Home Work Mobile   Joseph,Reeves Significant other [phone]        Current Medical History  Patient Admitting Diagnosis: 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  He has a sister in  [place] as well as another sister in [place] with good support.  Presented 11/07/2018 with numbness in his buttocks bilaterally as well as legs and feet.  The numbness in his right leg and foot are greater than the left leg.  He also describes weakness in the lower extremities right greater than left.  Patient also noticed some difficulty with urination.  CT/MRI of the brain unremarkable.  Sedimentation rate within normal limits at 15, SARS coronavirus negative, urine drug screen positive benzos.  LP completed CSF inconclusive due to bloody tap.  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  Underwent L2-3 lumbar decompression including L2 and L3  laminectomy, L2-3 discectomy, 2-3 foraminotomy and decompression of central canal 11/09/2018 per Dr. Sherwood Gambler.  Hospital course pain management.  Acute blood loss anemia 10.7.  Patient with low-grade fever 101 urine study negative and chest x-ray showed no active disease.  Blood cultures no growth to date.  Patient with bouts of urinary retention with residuals greater than 900 and a Foley tube was placed.  Therapy evaluations completed back brace when out of bed.  Patient is to be admitted for a comprehensive rehab program on 11/13/2018.    Patient's medical record from [facility] has been reviewed by the rehabilitation admission coordinator and physician.  Past Medical History  Past Medical History:  Diagnosis Date  . Sciatica     Family History   family history includes Heart failure in his father; Hypertension in his father.  Prior Rehab/Hospitalizations Has the patient had prior rehab or hospitalizations prior to admission? No  Has the patient had major surgery during 100 days prior to admission? Yes   Current Medications  Current  Facility-Administered Medications:  .  acetaminophen (TYLENOL) tablet 650 mg, 650 mg, Oral, Q6H PRN, 650 mg at 11/11/18 2103 **OR** acetaminophen (TYLENOL) suppository 650 mg, 650 mg, Rectal, Q6H PRN, Joseph Gravel, MD .  Chlorhexidine Gluconate Cloth 2 % PADS 6 each, 6 each, Topical, Q0600, Joseph Phi, DO, 6 each at 11/13/18 0531 .  docusate sodium (COLACE) capsule 100 mg, 100 mg, Oral, BID PRN, Joseph Phi, DO, 100 mg at 11/12/18 2312 .  Glycerin (Adult) 2.1 g suppository 1 suppository, 1 suppository, Rectal, Daily PRN, Joseph Phi, DO, 1 suppository at 11/13/18 0630 .  HYDROcodone-acetaminophen (NORCO/VICODIN) 5-325 MG per tablet 1-2 tablet, 1-2 tablet, Oral, Q4H PRN, Jovita Gamma, MD, 2 tablet at 11/13/18 1045 .  mupirocin ointment (BACTROBAN) 2 % 1 application, 1 application, Nasal, BID, Joseph Phi, DO, 1 application at 91/69/45 [phone] .  phenol (CHLORASEPTIC) mouth spray 1 spray, 1 spray, Mouth/Throat, PRN, Joseph, Laxman, MD, 1 spray at 11/07/18 2336 .  polyethylene glycol (MIRALAX / GLYCOLAX) packet 17 g, 17 g, Oral, Daily, Joseph Phi, DO, 17 g at 11/13/18 8280  Patients Current Diet:  Diet Order            Diet regular Room service appropriate? Yes; Fluid consistency: Thin  Diet effective now              Precautions / Restrictions Precautions Precautions: (P) Back, Fall Precaution Booklet Issued: (P) No Precaution Comments: (P) reviewed bed mob Spinal Brace: (P) Lumbar corset, Applied in sitting position Restrictions Weight Bearing Restrictions: (P) No   Has the patient had 2 or more falls or a fall with injury in the past year? Yes  Prior Activity Level Community (5-7x/wk): very Independent PTA; works for own company, drives, no AD use. Lives in [place] and was down here for a training seminar with work  Prior Functional Level Self Care: Did the patient need help bathing, dressing, using the toilet or eating? Independent  Indoor Mobility: Did the patient  need assistance with walking from room to room (with or without device)? Independent  Stairs: Did the patient need assistance with internal or external stairs (with or without device)? Independent  Functional Cognition: Did the patient need help planning regular tasks such as shopping or remembering to take medications? Independent  Home Assistive Devices / Equipment Home Assistive Devices/Equipment: None Home Equipment: Cane - single point  Prior Device Use: Indicate devices/aids used by the patient prior to current illness, exacerbation  or injury? None of the above  Current Functional Level Cognition  Overall Cognitive Status: (P) Within Functional Limits for tasks assessed Orientation Level: Oriented X4 General Comments: (P) pt is tearful at one point about the changes in his condition    Extremity Assessment (includes Sensation/Coordination)  Upper Extremity Assessment: Overall WFL for tasks assessed  Lower Extremity Assessment: RLE deficits/detail RLE Deficits / Details: right leg more impaired than L leg with R ankle 1/5 DF, knee 2/5, hip 2/5 RLE Sensation: decreased light touch RLE Coordination: decreased gross motor LLE Deficits / Details: strength at least 3/5 throughout.   LLE Sensation: decreased light touch LLE Coordination: decreased gross motor    ADLs  Overall ADL's : Needs assistance/impaired Eating/Feeding: Independent Grooming: Wash/dry hands, Wash/dry face, Oral care, Set up, Sitting Upper Body Bathing: Set up, Sitting Lower Body Bathing: Moderate assistance, Sitting/lateral leans Upper Body Dressing : Minimal assistance, Sitting Lower Body Dressing: Maximal assistance, Total assistance Toilet Transfer Details (indicate cue type and reason): stedy lift    Mobility  Overal bed mobility: (P) Needs Assistance Bed Mobility: (P) Rolling, Sidelying to Sit, Sit to Sidelying Rolling: Mod assist Sidelying to sit: Mod assist Supine to sit: Min guard General bed  mobility comments: needed mod A for bridging of knees and maintaining that position. Use of rail for rolling and mod A for LE's off bed. Mod HHA for elevation of trunk into sitting. Pt with increase in pain as soon as wt onto hips    Transfers  Overall transfer level: Needs assistance Equipment used: Ambulation equipment used Transfer via Lift Equipment: Stedy Transfers: Sit to/from Stand Sit to Stand: From elevated surface, Min assist, Mod assist General transfer comment: pt needed mod A for sit<>stand from bed, min A from elevated surface of stedy. Practiced sit<>stand 3x within stedy, heavy use of UE's    Ambulation / Gait / Stairs / Wheelchair Mobility  Ambulation/Gait General Gait Details: B knee buckling, unable to pick up feet in [place] to take steps. Worked on pregait wt shifting in standing in stedy    Posture / Balance Dynamic Sitting Balance Sitting balance - Comments: Pt unweighting his back in sitting with bil UEs.  Supervision EOB.  Balance Overall balance assessment: Needs assistance Sitting-balance support: Feet supported, Bilateral upper extremity supported Sitting balance-Leahy Scale: Fair Sitting balance - Comments: Pt unweighting his back in sitting with bil UEs.  Supervision EOB.  Standing balance support: Bilateral upper extremity supported Standing balance-Leahy Scale: Poor Standing balance comment: once standing in steady, no physical support needed, however, steady blocking both knees and pt using UEs for support.     Special needs/care consideration BiPAP/CPAP : no CPM : no Continuous Drip IV : no Dialysis : no        Days : no Life Vest : no Oxygen : no Special Bed : no Trach Size : no Wound Vac (area) : no      Location : no Skin: surgical incision to back                  Bowel mgmt: last BM: 11/05/2018 Bladder mgmt: urinary catheter in place, incontinent  Diabetic mgmt: : no Behavioral consideration : no Chemo/radiation : no   Previous Home  Environment (from acute therapy documentation) Living Arrangements: Spouse/significant other Available Help at Discharge: Family Type of Home: House Home Layout: Multi-level Alternate Level Stairs-Rails: Right Alternate Level Stairs-Number of Steps: flight Home Access: Stairs to enter Entrance Stairs-Rails: None Entrance Stairs-Number of Steps:  2 Home Care Services: No  Discharge Living Setting Plans for Discharge Living Setting: Other; sister's house in [place] (4 hours from here). Type of Home at Discharge: house Discharge Home Layout: one story  Discharge Home Access: stairs, one step to enter (low step) Discharge Bathroom Shower/Tub: tub/shower Discharge Bathroom Toilet: Standard Discharge Bathroom Accessibility: Yes How Accessible: RW Does the patient have any problems obtaining your medications?: No  Social/Family/Support Systems Patient Roles: Other (Comment)(lives with significant other; works, drives) Sport and exercise psychologist Information: significant other: Michiel Sites [phone]). Pt will be staying with his sister at [place] Anticipated Caregiver: Sister: Hilda Blades [phone]; [phone] Anticipated Caregiver's Contact Information: see above Ability/Limitations of Caregiver:  Caregiver Availability: 24/7 (sister works from home) Discharge Plan Discussed with Primary Caregiver: Yes Is Caregiver In Agreement with Plan?: Yes Does Caregiver/Family have Issues with Lodging/Transportation while Pt is in Rehab?: No  Goals/Additional Needs Patient/Family Goal for Rehab: PT/OT: Min A; SLP: NA Expected length of stay: 14-18 days Cultural Considerations: NA Dietary Needs: regular diet, thin liquids Equipment Needs: TBD Pt/Family Agrees to Admission and willing to participate: Yes Program Orientation Provided & Reviewed with Pt/Caregiver Including Roles  & Responsibilities: Yes(pt and his sister Hilda Blades)  Barriers to Discharge: 1 step to enter; needs to be one person Min/mod A; bowel and bladder  management. No insurance for SNF placement.   Decrease burden of Care through IP rehab admission: NA  Possible need for SNF placement upon discharge: Not anticipated; pt is uninsured and cannot be placed in SNF. Pt has good social support from family with his sister committed to assisting with his care at DC. Anticipate pt will be able to get to a one person assist for a safe DC.   Patient Condition: I have reviewed medical records from [facility], spoken with CM, and patient and family member (sister). I met with patient at the bedside for inpatient rehabilitation assessment.  Patient will benefit from ongoing PT and OT, can actively participate in 3 hours of therapy a day 5 days of the week, and can make measurable gains during the admission.  Patient will also benefit from the coordinated team approach during an Inpatient Acute Rehabilitation admission.  The patient will receive intensive therapy as well as Rehabilitation physician, nursing, social worker, and care management interventions.  Due to bladder management, bowel management, safety, skin/wound care, disease management, medication administration, pain management and patient education the patient requires 24 hour a day rehabilitation nursing.  The patient is currently Min/Mod A transfers with ambulation equipment; no mobility tested at this time; and Min to Max A for basic ADLs.  Discharge setting and therapy post discharge at home with home health is anticipated.  Patient has agreed to participate in the Acute Inpatient Rehabilitation Program and will admit today.  Preadmission Screen Completed By:  Raechel Ache, 11/13/2018 11:56 AM ______________________________________________________________________   Discussed status with Dr. Dagoberto Ligas on 11/13/2018 at 11:55AM and received approval for admission today.  Admission Coordinator:  Raechel Ache, OT, time 11:55AM/Date 11/13/2018   Assessment/Plan: Diagnosis: 1. Does the need  for close, 24 hr/day Medical supervision in concert with the patient's rehab needs make it unreasonable for this patient to be served in a less intensive setting? Yes 2. Co-Morbidities requiring supervision/potential complications: Neurogenic bowel and bladder, post op pain, SCI,  3. Due to bladder management, bowel management, safety, medication administration, pain management and patient education, does the patient require 24 hr/day rehab nursing? Yes 4. Does the patient require coordinated care of a physician, rehab  nurse, PT, OT, and SLP to address physical and functional deficits in the context of the above medical diagnosis(es)? Yes Addressing deficits in the following areas: balance, endurance, locomotion, strength, transferring, bowel/bladder control, bathing, dressing, grooming, toileting and psychosocial support 5. Can the patient actively participate in an intensive therapy program of at least 3 hrs of therapy 5 days a week? Yes 6. The potential for patient to make measurable gains while on inpatient rehab is excellent 7. Anticipated functional outcomes upon discharge from inpatient rehab: min assist PT, min assist OT, n/a SLP 8. Estimated rehab length of stay to reach the above functional goals is: 14-18 days 9. Anticipated discharge destination: Home 10. Overall Rehab/Functional Prognosis: good   MD Signature:

## 2018-11-11 ENCOUNTER — Inpatient Hospital Stay (HOSPITAL_COMMUNITY): Payer: Self-pay

## 2018-11-11 ENCOUNTER — Other Ambulatory Visit: Payer: Self-pay

## 2018-11-11 LAB — URINALYSIS, ROUTINE W REFLEX MICROSCOPIC
Bacteria, UA: NONE SEEN
Bilirubin Urine: NEGATIVE
Glucose, UA: NEGATIVE mg/dL
Ketones, ur: NEGATIVE mg/dL
Leukocytes,Ua: NEGATIVE
Nitrite: NEGATIVE
Protein, ur: NEGATIVE mg/dL
Specific Gravity, Urine: 1.016 (ref 1.005–1.030)
pH: 7 (ref 5.0–8.0)

## 2018-11-11 IMAGING — DX DG CHEST 1V PORT
1 series · 1 of 1 positions shown · non-contrast
Comparison: [DATE]

CLINICAL DATA: Fever

EXAM:
PORTABLE CHEST 1 VIEW

[chest ap]
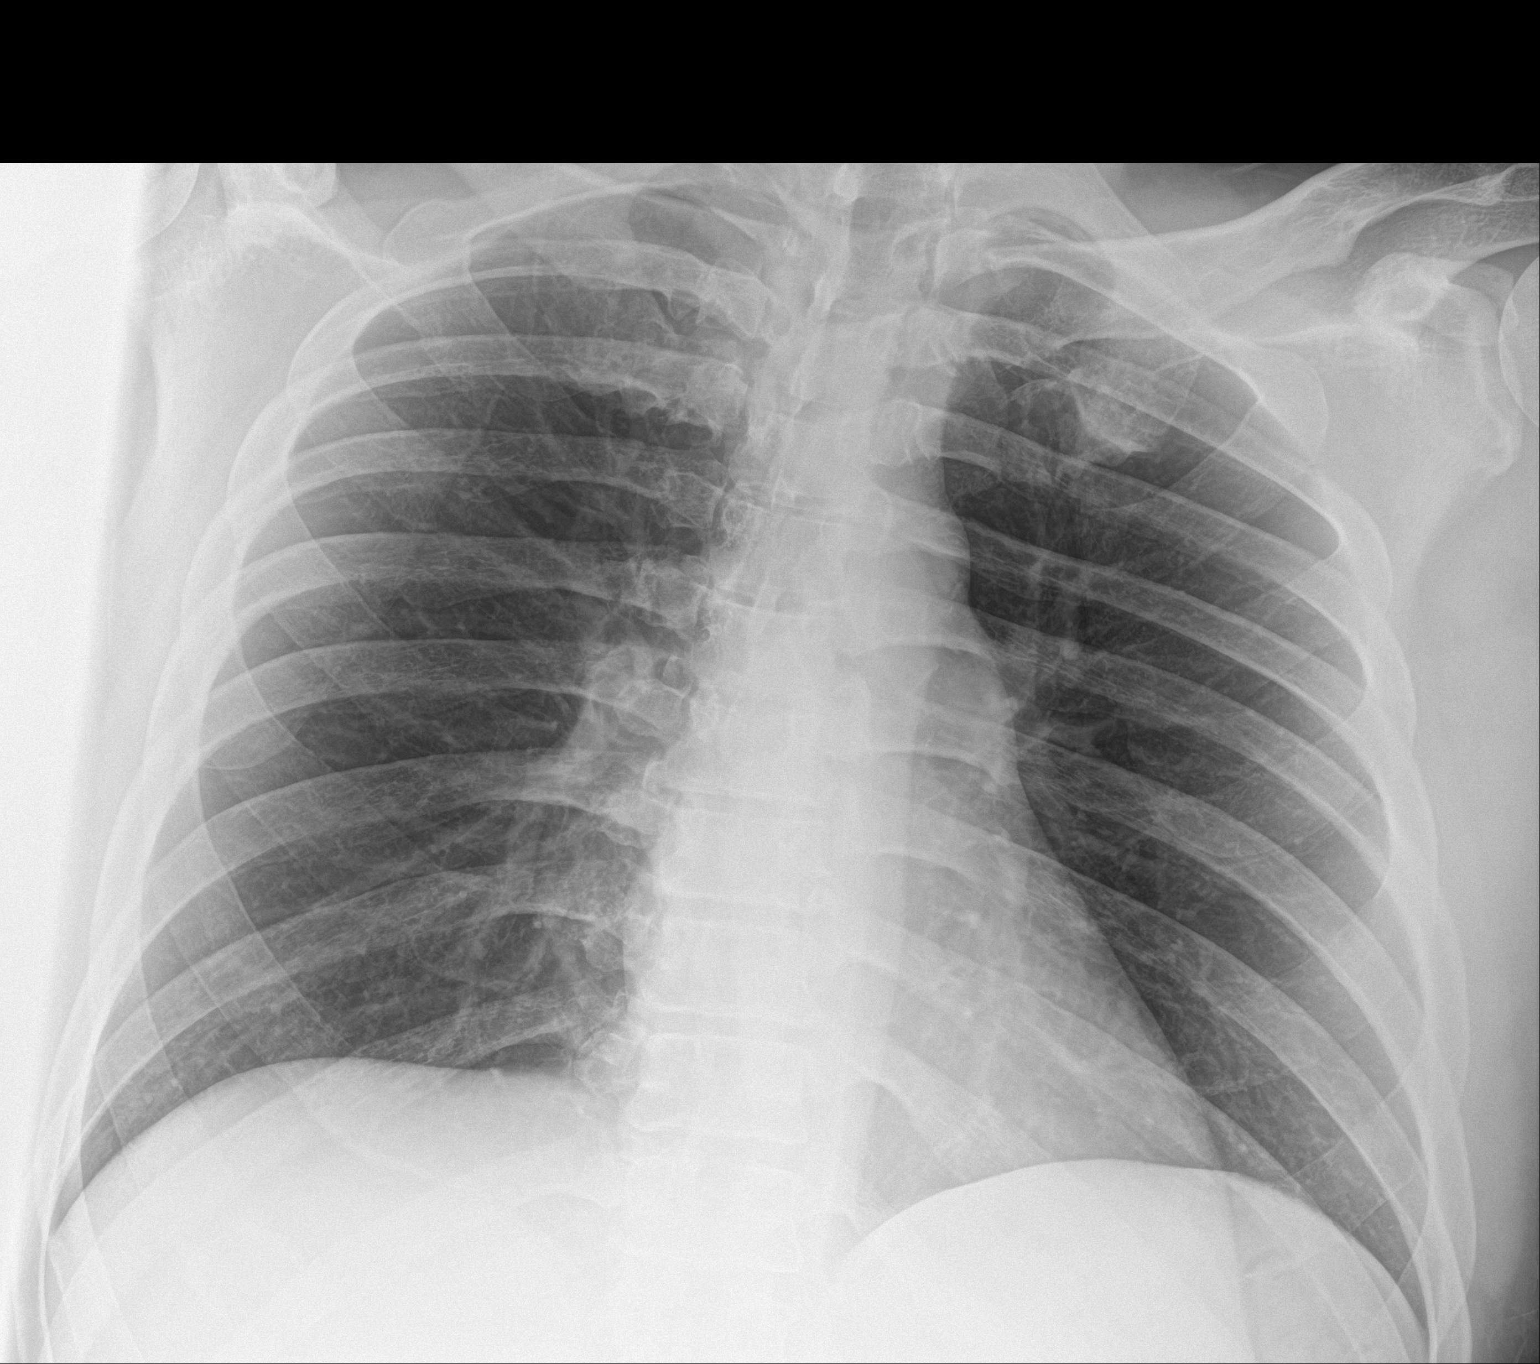

[1 of 1 positions shown; findings below may reference images not displayed]

FINDINGS: The heart size and mediastinal contours are within normal limits.
Both lungs are clear. The visualized skeletal structures are
unremarkable.
IMPRESSION: No acute abnormality of the lungs in AP portable projection.

## 2018-11-11 MED ORDER — DOCUSATE SODIUM 100 MG PO CAPS
100.0000 mg | ORAL_CAPSULE | Freq: Every day | ORAL | Status: DC | PRN
  Administered 2018-11-11 – 2018-11-12 (×2): 100 mg via ORAL
  Filled 2018-11-11 (×2): qty 1

## 2018-11-11 NOTE — Progress Notes (Signed)
Vitals:   11/11/18 0222 11/11/18 0227 11/11/18 0352 11/11/18 0725  BP:   (!) 147/75 132/70  Pulse:   82 82  Resp:   18 16  Temp: 100.1 F (37.8 C)  99.8 F (37.7 C) 99.1 F (37.3 C)  TempSrc: Oral  Oral Oral  SpO2:   97% 98%  Weight:  107.7 kg    Height:        CBC Recent Labs    11/10/18 0303  WBC 8.6  HGB 10.7*  HCT 31.2*  PLT 185   BMET Recent Labs    11/10/18 0303  NA 138  K 4.2  CL 103  CO2 28  GLUCOSE 140*  BUN 17  CREATININE 1.18  CALCIUM 8.6*    Patient resting in bed, comfortable.  PT and OT resumed.  Patient has significant deficits, and CIR has been recommended.  His sister Jackelyn Poling, who lives in [place], as well as his sister and [place] have offered to care for him following his CIR.  Dressing removed, and lumbar wound looks excellent.  There is no erythema, ecchymosis, swelling, or drainage.  Motor examination shows left dorsiflexor and plantar flexor are 3/5 and right dorsiflexor and plantar flexor are 2/5.  Plan: Continue PT and OT, to progress to CIR.  We will leave the wound open to air.  Leaving Foley to straight drainage for now, but will need bladder training in CIR.  Comfortable enough that we will DC morphine and use just hydrocodone or Tylenol as needed for pain.  Will DC Valium as well.  With mild fever will have nursing staff encourage incentive spirometry every hour.  Hosie Spangle, MD 11/11/2018, 8:18 AM

## 2018-11-11 NOTE — Progress Notes (Signed)
PROGRESS NOTE    Joseph Reeves  UJW:[id number] DOB: Aug 01, 1965 DOA: 11/06/2018 PCP: Patient, No Pcp Per     Brief Narrative:  Joseph Reeves is a 53 year old male who lives in [place] and visiting [place] due to work presents with numbness and tingling of bilateral legs since Monday.  Symptoms have progressed to include weakness and significantly getting worse.  He has been evaluated by emergency department 3 times in the past week.  Patient underwent lumbar puncture which resulted in a bloody tap.  He also underwent MRI of thoracic and lumbar spine.  Neurosurgery and neurology consulted.  New events last 24 hours / Subjective: States that he felt very cold overnight, had a fever T-max 101.  He denies any other issues other than some lower back pain at the site of surgical intervention.  Denies any cough or shortness of breath.  Assessment & Plan:   Principal Problem:   Leg weakness Active Problems:   Numbness and tingling of both legs   Numbness, tingling, weakness of the bilateral lower extremities secondary to L2-L3 disc herniation -Neurology evaluated patient during hospitalization and has recommended further neurosurgery evaluation -Status post L2-L3 decompression and laminectomy 10/22 by Dr. Newell Coral -PT OT recommending CIR -Continue Foley catheter to continue bladder rest due to neurogenic urinary retention on presentation  Fever -Urinalysis negative for UTI -Chest x-ray pending  -Blood cultures pending -Encourage incentive spirometer -No sign of wound infection     DVT prophylaxis: SCD Code Status: Full code Family Communication: No family at bedside Disposition Plan: CIR evaluation pending   Consultants:   Neurology  Neurosurgery  Procedures:   None  Antimicrobials:  Anti-infectives (From admission, onward)   Start     Dose/Rate Route Frequency Ordered Stop   11/09/18 1107  bacitracin 50,000 Units in sodium chloride 0.9 % 500 mL  irrigation  Status:  Discontinued       As needed 11/09/18 1107 11/09/18 1604       Objective: Vitals:   11/11/18 0222 11/11/18 0227 11/11/18 0352 11/11/18 0725  BP:   (!) 147/75 132/70  Pulse:   82 82  Resp:   18 16  Temp: 100.1 F (37.8 C)  99.8 F (37.7 C) 99.1 F (37.3 C)  TempSrc: Oral  Oral Oral  SpO2:   97% 98%  Weight:  107.7 kg    Height:        Intake/Output Summary (Last 24 hours) at 11/11/2018 1044 Last data filed at 11/11/2018 0949 Gross per 24 hour  Intake 300 ml  Output 1725 ml  Net -1425 ml   Filed Weights   11/09/18 1028 11/10/18 0515 11/11/18 0227  Weight: 108.5 kg 107.6 kg 107.7 kg    Examination: General exam: Appears calm and comfortable  Respiratory system: Clear to auscultation. Respiratory effort normal. On room air  Cardiovascular system: S1 & S2 heard, RRR. No pedal edema. Gastrointestinal system: Abdomen is nondistended, soft and nontender. Normal bowel sounds heard. Central nervous system: Alert and oriented. Speech clear. Extremities: Symmetric in appearance bilaterally  Skin: No rashes, lesions or ulcers on exposed skin  Psychiatry: Judgement and insight appear stable. Mood & affect appropriate.    Data Reviewed: I have personally reviewed following labs and imaging studies  CBC: Recent Labs  Lab 11/06/18 1651 11/07/18 0554 11/07/18 1854 11/08/18 0611 11/10/18 0303  WBC 13.5* 9.2  --  8.7 8.6  NEUTROABS 11.2*  --   --   --   --   HGB 12.7* 13.4  --  11.5* 10.7*  HCT 39.4 43.7 38.1 35.2* 31.2*  MCV 97.8 108.4*  --  98.1 95.7  PLT 261 236  --  207 185   Basic Metabolic Panel: Recent Labs  Lab 11/06/18 1651 11/07/18 0554 11/08/18 0402 11/10/18 0303  NA 141 143 140 138  K 3.7 3.9 4.0 4.2  CL 102 109 106 103  CO2 27 21* 26 28  GLUCOSE 130* 111* 128* 140*  BUN 27* 25* 22* 17  CREATININE 1.11 1.11 1.08 1.18  CALCIUM 9.1 9.0 8.6* 8.6*   GFR: Estimated Creatinine Clearance: 94.3 mL/min (by C-G formula based on SCr of  1.18 mg/dL). Liver Function Tests: Recent Labs  Lab 11/06/18 1651 11/07/18 0554 11/08/18 0402  AST 26 21 16   ALT 22 19 18   ALKPHOS 35* 29* 31*  BILITOT 0.5 QUANTITY NOT SUFFICIENT, UNABLE TO PERFORM TEST 0.9  PROT 7.7 6.7 6.0*  ALBUMIN 4.4 3.8 3.4*   Recent Labs  Lab 11/06/18 1651  LIPASE 22   No results for input(s): AMMONIA in the last 168 hours. Coagulation Profile: No results for input(s): INR, PROTIME in the last 168 hours. Cardiac Enzymes: Recent Labs  Lab 11/07/18 0554  CKTOTAL 623*  CKMB 3.6   BNP (last 3 results) No results for input(s): PROBNP in the last 8760 hours. HbA1C: No results for input(s): HGBA1C in the last 72 hours. CBG: Recent Labs  Lab 11/09/18 0825  GLUCAP 94   Lipid Profile: No results for input(s): CHOL, HDL, LDLCALC, TRIG, CHOLHDL, LDLDIRECT in the last 72 hours. Thyroid Function Tests: No results for input(s): TSH, T4TOTAL, FREET4, T3FREE, THYROIDAB in the last 72 hours. Anemia Panel: No results for input(s): VITAMINB12, FOLATE, FERRITIN, TIBC, IRON, RETICCTPCT in the last 72 hours. Sepsis Labs: No results for input(s): PROCALCITON, LATICACIDVEN in the last 168 hours.  Recent Results (from the past 240 hour(s))  SARS Coronavirus 2 by RT PCR (hospital order, [facility]) Nasopharyngeal Nasopharyngeal Swab     Status: None   Collection Time: 11/06/18  6:01 PM   Specimen: Nasopharyngeal Swab  Result Value Ref Range Status   SARS Coronavirus 2 NEGATIVE NEGATIVE Final    Comment: (NOTE) If result is NEGATIVE SARS-CoV-2 target nucleic acids are NOT DETECTED. The SARS-CoV-2 RNA is generally detectable in upper and lower  respiratory specimens during the acute phase of infection. The lowest  concentration of SARS-CoV-2 viral copies this assay can detect is 250  copies / mL. A negative result does not preclude SARS-CoV-2 infection  and should not be used as the sole basis for treatment or other  patient  management decisions.  A negative result may occur with  improper specimen collection / handling, submission of specimen other  than nasopharyngeal swab, presence of viral mutation(s) within the  areas targeted by this assay, and inadequate number of viral copies  (<250 copies / mL). A negative result must be combined with clinical  observations, patient history, and epidemiological information. If result is POSITIVE SARS-CoV-2 target nucleic acids are DETECTED. The SARS-CoV-2 RNA is generally detectable in upper and lower  respiratory specimens dur ing the acute phase of infection.  Positive  results are indicative of active infection with SARS-CoV-2.  Clinical  correlation with patient history and other diagnostic information is  necessary to determine patient infection status.  Positive results do  not rule out bacterial infection or co-infection with other viruses. If result is PRESUMPTIVE POSTIVE SARS-CoV-2 nucleic acids MAY BE PRESENT.   A presumptive  positive result was obtained on the submitted specimen  and confirmed on repeat testing.  While 2019 novel coronavirus  (SARS-CoV-2) nucleic acids may be present in the submitted sample  additional confirmatory testing may be necessary for epidemiological  and / or clinical management purposes  to differentiate between  SARS-CoV-2 and other Sarbecovirus currently known to infect humans.  If clinically indicated additional testing with an alternate test  methodology [phone]) is advised. The SARS-CoV-2 RNA is generally  detectable in upper and lower respiratory sp ecimens during the acute  phase of infection. The expected result is Negative. Fact Sheet for Patients:  StrictlyIdeas.no Fact Sheet for Healthcare Providers: BankingDealers.co.za This test is not yet approved or cleared by the [place] FDA and has been authorized for detection and/or diagnosis of SARS-CoV-2 by FDA under  an Emergency Use Authorization (EUA).  This EUA will remain in effect (meaning this test can be used) for the duration of the COVID-19 declaration under Section 564(b)(1) of the Act, 21 U.S.C. section 360bbb-3(b)(1), unless the authorization is terminated or revoked sooner. Performed at [facility], [address] [place], [place] [address]   CSF culture     Status: None   Collection Time: 11/07/18  3:05 AM   Specimen: CSF; Cerebrospinal Fluid  Result Value Ref Range Status   Specimen Description CSF  Final   Special Requests TUBE 2  Final   Gram Stain   Final    CYTOSPIN SMEAR RARE WBC PRESENT, PREDOMINANTLY PMN NO ORGANISMS SEEN    Culture   Final    NO GROWTH 3 DAYS Performed at [facility], [place] [address] [place], [place] [address]    Report Status 11/10/2018 FINAL  Final  Surgical PCR screen     Status: None   Collection Time: 11/09/18  5:34 AM   Specimen: Nasal Mucosa; Nasal Swab  Result Value Ref Range Status   MRSA, PCR NEGATIVE NEGATIVE Final   Staphylococcus aureus NEGATIVE NEGATIVE Final    Comment: (NOTE) The Xpert SA Assay (FDA approved for NASAL specimens in patients 36 years of age and older), is one component of a comprehensive surveillance program. It is not intended to diagnose infection nor to guide or monitor treatment. Performed at [facility], [place] [address], [place], [place] [address]       Radiology Studies: Dg Lumbar Spine 2-3 Views  Result Date: 11/09/2018 CLINICAL DATA:  L2-3 PLIF EXAM: DG C-ARM 1-60 MIN; LUMBAR SPINE - 2-3 VIEW COMPARISON:  Intraoperative films from earlier in the same day. FLUOROSCOPY TIME:  Fluoroscopy Time:  48 seconds Radiation Exposure Index (if provided by the fluoroscopic device): Not available Number of Acquired Spot Images: 2 FINDINGS: Interbody fusion at L2-3 is noted with pedicle screws and posterior fixation. IMPRESSION: L2-3 fusion Electronically Signed   By: Inez Catalina M.D.   On:  11/09/2018 15:43   Dg Lumbar Spine 2-3 Views  Result Date: 11/09/2018 CLINICAL DATA:  Intraoperative localization EXAM: LUMBAR SPINE - 2-3 VIEW COMPARISON:  11/07/2018 FINDINGS: Lateral views of the lumbar spine were obtained. Initial image demonstrates needles within the posterior soft tissues at the L1 and L3 levels. Subsequent lateral few shows surgical retractors and instruments at the L2-3 level. IMPRESSION: Intraoperative localization at L2-3. Electronically Signed   By: Inez Catalina M.D.   On: 11/09/2018 12:56   Dg C-arm 1-60 Min  Result Date: 11/09/2018 CLINICAL DATA:  L2-3 PLIF EXAM: DG C-ARM 1-60 MIN; LUMBAR SPINE -  2-3 VIEW COMPARISON:  Intraoperative films from earlier in the same day. FLUOROSCOPY TIME:  Fluoroscopy Time:  48 seconds Radiation Exposure Index (if provided by the fluoroscopic device): Not available Number of Acquired Spot Images: 2 FINDINGS: Interbody fusion at L2-3 is noted with pedicle screws and posterior fixation. IMPRESSION: L2-3 fusion Electronically Signed   By: Alcide Clever M.D.   On: 11/09/2018 15:43      Scheduled Meds: . Chlorhexidine Gluconate Cloth  6 each Topical Q0600  . mupirocin ointment  1 application Nasal BID   Continuous Infusions:    LOS: 3 days      Time spent: 25 minutes   Noralee Stain, DO [facility] 11/11/2018, 10:44 AM   Available via Epic secure chat 7am-7pm After these hours, please refer to coverage provider listed on [email or web address]

## 2018-11-11 NOTE — H&P (Signed)
Physical Medicine and Rehabilitation Admission H&P    Chief Complaint  Patient presents with  . Back Pain  : HPI: Joseph Reeves is a 53 year old right-handed male with history of tobacco abuse (rate cigar) as well as sciatica status post ESI in the remote past and maintained on Valium, mobic as well as oxycodone as needed.  Per chart review patient lives with significant other.  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  He has a sister in [place] as well as another sister in [place] with good support.  Presented 11/07/2018 with numbness in his buttocks bilaterally as well as legs and feet.  The numbness in his right leg and foot are greater than the left leg.  He also describes weakness in the lower extremities right greater than left.  Patient also noticed some difficulty with urination.  CT/MRI of the brain unremarkable.  Sedimentation rate within normal limits at 15, SARS coronavirus negative, urine drug screen positive benzos.  LP completed CSF inconclusive due to bloody tap.  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  Underwent L2-3 lumbar decompression including L2 and L3 laminectomy, L2-3 discectomy, 2-3 foraminotomy and decompression of central canal 11/09/2018 per Dr. Newell Coral.  Hospital course pain management.  Acute blood loss anemia 10.7.  Patient with low-grade fever 101 urine study negative and chest x-ray showed no active disease.  Blood cultures no growth to date.  Patient with bouts of urinary retention with residuals greater than 900 and a Foley tube was placed.  Therapy evaluations completed back brace when out of bed.  Patient to be admitted for a comprehensive rehab program.  Pt reports no significant pain, but has soreness from no control of  lower body- occ muscle spasm at T12 area/groin B/L. Currently on bedpan- thinks he can go; hasn't had a BM in 8+ days. Has had some return of movement in LEs- initially had NO movement.  Review of Systems  Constitutional: Positive for fever. Negative for chills.  HENT: Negative for hearing loss.   Eyes: Negative for blurred vision and double vision.  Respiratory: Negative for cough and shortness of breath.   Cardiovascular: Negative for chest pain, palpitations and leg swelling.  Gastrointestinal: Positive for constipation. Negative for heartburn, nausea and vomiting.  Genitourinary: Negative for dysuria, flank pain and hematuria.       Urinary retention  Musculoskeletal: Positive for back pain and myalgias.  Skin: Negative for rash.  Neurological: Positive for sensory change and weakness.  All other systems reviewed and are negative.  Past Medical History:  Diagnosis Date  . Sciatica    Past Surgical History:  Procedure Laterality Date  . RADIOLOGY WITH ANESTHESIA N/A 11/07/2018   Procedure: MRI WITH ANESTHESIA;  Surgeon: Radiologist, Medication, MD;  Location: [facility] OR;  Service: Radiology;  Laterality: N/A;   Family History  Problem Relation Age of Onset  . Hypertension Father   . Heart failure Father    Social History:  reports that he has been smoking cigars. He has never used smokeless tobacco. He reports current alcohol use. He reports that he does not use drugs. Allergies: No Known Allergies Medications Prior to Admission  Medication Sig Dispense Refill  . diazepam (VALIUM) 5 MG tablet Take 1 tablet (5 mg total) by mouth every 6 (six) hours  as needed for anxiety (spasms). 10 tablet 0  . meloxicam (MOBIC) 15 MG tablet Take 1 tablet daily for back pain. 15 tablet 0  . oxyCODONE-acetaminophen (PERCOCET) 5-325 MG tablet Take 1 tablet by mouth every 4 (four) hours as needed for severe pain. 20 tablet 0  . predniSONE (DELTASONE) 20 MG tablet Take 60 mg daily x 2 days then 40 mg  daily x 2 days then 20 mg daily x 2 days (Patient taking differently: Take 20-60 mg by mouth See admin instructions. Take 3 tablets for 2 days then take 2 tablets for 2 days then take 1 tablet for 2 days) 12 tablet 0    Drug Regimen Review Drug regimen was reviewed and remains appropriate with no significant issues identified  Home: Home Living Family/patient expects to be discharged to:: Private residence Living Arrangements: Spouse/significant other Available Help at Discharge: Family Type of Home: House Home Access: Stairs to enter Secretary/administratorntrance Stairs-Number of Steps: 2 Entrance Stairs-Rails: None Home Layout: Multi-level Alternate Level Stairs-Number of Steps: flight Alternate Level Stairs-Rails: Right Home Equipment: Cane - single point  Was a [employer] driver, 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 [place], [place]- sister's is @ [place] with 0-1 STE; house in [place] has 3 STE.  Functional History: Prior Function Level of Independence: Independent Comments: is from [place], was here for a class for work, likes to fish, ride his bike and motorcycle ("anything outside")  Functional Status:  Mobility: Bed Mobility Overal bed mobility: Needs Assistance Bed Mobility: Rolling, Sidelying to Sit Rolling: Mod assist Sidelying to sit: Mod assist Supine to sit: Min guard General bed mobility comments: needed mod A for bridging of knees and maintaining that position. Use of rail for rolling and mod A for LE's off bed. Mod HHA for elevation of trunk into sitting. Pt with increase in pain as soon as wt onto hips Transfers Overall transfer level: Needs assistance Equipment used: Ambulation equipment used Transfer via Lift Equipment: Stedy Transfers: Sit to/from Stand Sit to Stand: From elevated surface, Min assist, Mod assist General transfer comment: pt needed mod A for sit<>stand from bed, min A from elevated surface of stedy. Practiced sit<>stand 3x within stedy, heavy use of  UE's Ambulation/Gait General Gait Details: B knee buckling, unable to pick up feet in WB'ing to take steps. Worked on pregait wt shifting in standing in stedy    ADL: ADL Overall ADL's : Needs assistance/impaired Eating/Feeding: Independent Grooming: Wash/dry hands, Wash/dry face, Oral care, Set up, Sitting Upper Body Bathing: Set up, Sitting Lower Body Bathing: Moderate assistance, Sitting/lateral leans Upper Body Dressing : Minimal assistance, Sitting Lower Body Dressing: Maximal assistance, Total assistance Toilet Transfer Details (indicate cue type and reason): stedy lift  Cognition: Cognition Overall Cognitive Status: Within Functional Limits for tasks assessed Orientation Level: Oriented X4 Cognition Arousal/Alertness: Awake/alert Behavior During Therapy: WFL for tasks assessed/performed Overall Cognitive Status: Within Functional Limits for tasks assessed  Physical Exam: Blood pressure 121/69, pulse 79, temperature 99 F (37.2 C), temperature source Oral, resp. rate 18, height 6\' 1"  (1.854 m), weight 107.7 kg, SpO2 99 %. Physical Exam  Nursing note and vitals reviewed. Constitutional: He is oriented to person, place, and time. He appears well-developed and well-nourished.  Mother at side; lying supine in bed; good fruit plate delivered while I was there, calmer while I was there, NAD  HENT:  Head: Normocephalic and atraumatic.  Nose: Nose normal.  Mouth/Throat: No oropharyngeal exudate.  Eyes: Pupils are equal,  round, and reactive to light. Conjunctivae and EOM are normal. No scleral icterus.  Neck: Normal range of motion. Neck supple. No tracheal deviation present. No thyromegaly present.  Cardiovascular: Exam reveals no gallop and no friction rub.  No murmur heard. RRR good heart sounds  Respiratory: Effort normal and breath sounds normal.  CTA B/L with no wheezes, rales or rhonchi heard  GI:  Hyperactive BS- trying to have BM (on bedpan) abd slightly distended;  NT, firmer, not hard No rebound  Genitourinary:    Genitourinary Comments: Has foley- light amber urine   Musculoskeletal:        General: No tenderness or edema.     Comments: UEs 5/5 in deloitds, biceps, WE, triceps, grip and finger abd B/L  LEs- RLE HF 2/5, KE 2/5, DF 2/5, PF 1/5, EHL 0/5 LLE HF 2/5 (slightly stronger than R), KE 3/5, DF 3/5, PF 2/5, EHL 2/5   Neurological: He is alert and oriented to person, place, and time. No cranial nerve deficit.  Patient is alert .  Follows commands.  Oriented x3. Decreased sensation ONLY at R S1 on exam- tested dermatomes on arms, torso and legs- otherwise intact everywhere else. Flaccid partial paralysis  Skin:  Back incision is dressed Unable to assess since on bedpan  Psychiatric:  Calm, full affect, Ox3; appropriate    Results for orders placed or performed during the hospital encounter of 11/06/18 (from the past 48 hour(s))  Culture, blood (routine x 2)     Status: None (Preliminary result)   Collection Time: 11/11/18  7:55 AM   Specimen: BLOOD  Result Value Ref Range   Specimen Description BLOOD LEFT ANTECUBITAL    Special Requests AEROBIC BOTTLE ONLY Blood Culture adequate volume    Culture      NO GROWTH 1 DAY Performed at [facility], [address], [place], [place] [address]    Report Status PENDING   Culture, blood (routine x 2)     Status: None (Preliminary result)   Collection Time: 11/11/18  8:03 AM   Specimen: BLOOD  Result Value Ref Range   Specimen Description BLOOD RIGHT ANTECUBITAL    Special Requests AEROBIC BOTTLE ONLY Blood Culture adequate volume    Culture      NO GROWTH 1 DAY Performed at [facility], [address], [place], [place] [address]    Report Status PENDING   Urinalysis, Routine w reflex microscopic     Status: Abnormal   Collection Time: 11/11/18  8:29 AM  Result Value Ref Range   Color, Urine YELLOW YELLOW   APPearance CLEAR CLEAR   Specific Gravity, Urine 1.016 1.005 -  1.030   pH 7.0 5.0 - 8.0   Glucose, UA NEGATIVE NEGATIVE mg/dL   Hgb urine dipstick SMALL (A) NEGATIVE   Bilirubin Urine NEGATIVE NEGATIVE   Ketones, ur NEGATIVE NEGATIVE mg/dL   Protein, ur NEGATIVE NEGATIVE mg/dL   Nitrite NEGATIVE NEGATIVE   Leukocytes,Ua NEGATIVE NEGATIVE   RBC / HPF 0-5 0 - 5 RBC/hpf   WBC, UA 0-5 0 - 5 WBC/hpf   Bacteria, UA NONE SEEN NONE SEEN   Mucus PRESENT     Comment: Performed at [facility], [address] [place], [place] [address]   Dg Chest Port 1 View  Result Date: 11/11/2018 CLINICAL DATA:  Fever EXAM: PORTABLE CHEST 1 VIEW COMPARISON:  11/06/2018 FINDINGS: The heart size and mediastinal contours are within normal limits. Both lungs are clear. The  visualized skeletal structures are unremarkable. IMPRESSION: No acute abnormality of the lungs in AP portable projection. Electronically Signed   By: Eddie Candle M.D.   On: 11/11/2018 13:08       Medical Problem List and Plan: 1.  Numbness, tingling, weakness of bilateral lower extremities secondary to L2-L3 disc herniation; L1 ASIA C incomplete paraplegia due to  cauda equina syndrome. 2.  Antithrombotics: -DVT/anticoagulation: SCDs.  Check vascular study  -antiplatelet therapy: N/A 3. Pain Management: Hydrocodone as needed 4. Mood: Provide emotional support  -antipsychotic agents: N/A 5. Neuropsych: This patient is capable of making decisions on his own behalf. 6. Skin/Wound Care: Routine skin checks 7. Fluids/Electrolytes/Nutrition: Routine in and outs with follow-up chemistries 8.  Neurogenic bowel and bladder.  Need to discuss removing Foley tube and begin voiding trial- however since he had >1L of volume in bladder and bladder has been stretched, Urology usually asks [place] to wait ~ 1 week of bladder rest to remove foley- will discuss starting Flomax to maximize potential of pt voiding after foley removed. - pt likely has neurogenic bowel and is during spinal shock- gut doesn't work during  spinal shock and needs laxatives daily, as well as suppository nightly as part of bowel program- if pt unable to go on his own today, will put in for Suppository (enemas usually don't work in cauda equina syndrome patients) for this evening. 9.  Postoperative anemia.  Follow-up CBC 10. SCI education- will educate pt on SCI issues during inpt rehab. Will likely need Rehab physician in [place], [place] and in [place] in future.     Lavon Paganini Angiulli, PA-C 11/13/2018

## 2018-11-11 NOTE — Progress Notes (Signed)
Physical Therapy Treatment Patient Details Name: Joseph Reeves MRN: [id number] DOB: 09-Oct-1965 Today's Date: 11/11/2018    History of Present Illness Pt is a 53 y.o. M with no significant PMH of presents with LBP radiating down BLE with associated weakness and sensory symptoms. MRI brain negative. No transverse myelitis on T-spine MRI. 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 Pt s/p L2-3 decompression and fusion on 11/09/18.      PT Comments    Pt very motivated and working hard to mobilize, continues to be limited by pain and muscle spasming. Would benefit from trapeze over bed to be able to reposition self in bed for comfort. Practiced sit<>stand from bed and stedy with heavy use of UE's to stand and knees buckling in standing (supported by purple tray in stedy). Worked on wt shifting in standing for pregait. PT will continue to follow.    Follow Up Recommendations  CIR     Equipment Recommendations  Rolling walker with 5" wheels;3in1 (PT)    Recommendations for Other Services Rehab consult     Precautions / Restrictions Precautions Precautions: Back;Fall Precaution Booklet Issued: No Precaution Comments: reviewed log rolling Required Braces or Orthoses: Spinal Brace Spinal Brace: Lumbar corset;Applied in standing position Restrictions Weight Bearing Restrictions: No    Mobility  Bed Mobility Overal bed mobility: Needs Assistance Bed Mobility: Rolling;Sidelying to Sit Rolling: Mod assist Sidelying to sit: Mod assist       General bed mobility comments: needed mod A for bridging of knees and maintaining that position. Use of rail for rolling and mod A for LE's off bed. Mod HHA for elevation of trunk into sitting. Pt with increase in pain as soon as wt onto hips  Transfers Overall transfer level: Needs assistance Equipment used: Ambulation equipment used Transfers: Sit to/from Stand Sit to Stand: From elevated surface;Min assist;Mod assist         General transfer comment: pt needed mod A for sit<>stand from bed, min A from elevated surface of stedy. Practiced sit<>stand 3x within stedy, heavy use of UE's  Ambulation/Gait             General Gait Details: B knee buckling, unable to pick up feet in [place] to take steps. Worked on pregait wt shifting in standing in Writer    Modified Rankin (Stroke Patients Only)       Balance Overall balance assessment: Needs assistance Sitting-balance support: Feet supported;Bilateral upper extremity supported Sitting balance-Leahy Scale: Fair Sitting balance - Comments: Pt unweighting his back in sitting with bil UEs.  Supervision EOB.    Standing balance support: Bilateral upper extremity supported Standing balance-Leahy Scale: Poor Standing balance comment: once standing in steady, no physical support needed, however, steady blocking both knees and pt using UEs for support.                             Cognition Arousal/Alertness: Awake/alert Behavior During Therapy: WFL for tasks assessed/performed Overall Cognitive Status: Within Functional Limits for tasks assessed                                        Exercises      General Comments General comments (skin integrity, edema, etc.): back brace donned for session  Pertinent Vitals/Pain Pain Assessment: Faces Faces Pain Scale: Hurts whole lot Pain Location: low back Pain Descriptors / Indicators: Cramping;Constant Pain Intervention(s): Limited activity within patient's tolerance;Monitored during session;Premedicated before session    Home Living                      Prior Function            PT Goals (current goals can now be found in the care plan section) Acute Rehab PT Goals Patient Stated Goal: to get better and be my normal self again PT Goal Formulation: With patient Time For Goal Achievement: 11/24/18 Potential to  Achieve Goals: Good Progress towards PT goals: Progressing toward goals    Frequency    Min 5X/week      PT Plan Current plan remains appropriate    Reeves-evaluation              AM-PAC PT "6 Clicks" Mobility   Outcome Measure  Help needed turning from your back to your side while in a flat bed without using bedrails?: A Little Help needed moving from lying on your back to sitting on the side of a flat bed without using bedrails?: A Lot Help needed moving to and from a bed to a chair (including a wheelchair)?: A Lot Help needed standing up from a chair using your arms (e.g., wheelchair or bedside chair)?: A Lot Help needed to walk in hospital room?: Total Help needed climbing 3-5 steps with a railing? : Total 6 Click Score: 11    End of Session Equipment Utilized During Treatment: Back brace Activity Tolerance: Patient tolerated treatment well Patient left: in chair;with call bell/phone within reach Nurse Communication: Mobility status;Need for lift equipment PT Visit Diagnosis: Muscle weakness (generalized) (M62.81);Difficulty in walking, not elsewhere classified (R26.2);Pain Pain - part of body: (back)     Time: 6967-8938 PT Time Calculation (min) (ACUTE ONLY): 25 min  Charges:  $Gait Training: 8-22 mins $Therapeutic Activity: 8-22 mins                     Joseph Reeves, PT  Acute Rehab Services  Pager [phone] Office [phone]    Joseph Reeves 11/11/2018, 2:19 PM

## 2018-11-12 MED ORDER — MELATONIN 3 MG PO TABS
3.0000 mg | ORAL_TABLET | Freq: Once | ORAL | Status: AC
  Administered 2018-11-12: 23:00:00 3 mg via ORAL
  Filled 2018-11-12: qty 1

## 2018-11-12 MED ORDER — POLYETHYLENE GLYCOL 3350 17 G PO PACK
17.0000 g | PACK | Freq: Every day | ORAL | Status: DC
  Administered 2018-11-12 – 2018-11-13 (×2): 17 g via ORAL
  Filled 2018-11-12 (×2): qty 1

## 2018-11-12 MED ORDER — GLYCERIN (LAXATIVE) 2.1 G RE SUPP
1.0000 | Freq: Every day | RECTAL | Status: DC | PRN
  Administered 2018-11-12 – 2018-11-13 (×2): 1 via RECTAL
  Filled 2018-11-12 (×3): qty 1

## 2018-11-12 MED ORDER — DOCUSATE SODIUM 100 MG PO CAPS
100.0000 mg | ORAL_CAPSULE | Freq: Two times a day (BID) | ORAL | Status: DC | PRN
  Administered 2018-11-12: 23:00:00 100 mg via ORAL
  Filled 2018-11-12: qty 1

## 2018-11-12 MED ORDER — NON FORMULARY: 3.0000 mg | Freq: Once | Status: DC

## 2018-11-12 NOTE — Progress Notes (Signed)
Overall progressing well.  Patient's pain level well controlled.  Still with some right lower extremity weakness but this is slowly improving as well.  Making good progress with therapy.  He is afebrile.  His vital signs are stable.  Wound is healing well.  Neurologic exam is stable.  Progressing well overall.  Plan for rehabilitation transfer when bed available.

## 2018-11-12 NOTE — Progress Notes (Signed)
PROGRESS NOTE    Joseph Reeves  ZOX:[id number] DOB: 06/01/1965 DOA: 11/06/2018 PCP: Patient, No Pcp Per     Brief Narrative:  Joseph Reeves is a 53 year old male who lives in [place] and visiting [place] due to work presents with numbness and tingling of bilateral legs since Monday.  Symptoms have progressed to include weakness and significantly getting worse.  He has been evaluated by emergency department 3 times in the past week.  Patient underwent lumbar puncture which resulted in a bloody tap.  He also underwent MRI of thoracic and lumbar spine.  Neurosurgery and neurology consulted.  New events last 24 hours / Subjective: No further fevers, doing well overall  Assessment & Plan:   Principal Problem:   Leg weakness Active Problems:   Numbness and tingling of both legs   Numbness, tingling, weakness of the bilateral lower extremities secondary to L2-L3 disc herniation -Neurology evaluated patient during hospitalization and has recommended further neurosurgery evaluation -Status post L2-L3 decompression and laminectomy 10/22 by Dr. 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  Monitor off of antibiotics    DVT prophylaxis: SCD Code Status: Full code Family Communication: No family at bedside Disposition Plan: CIR evaluation pending   Consultants:   Neurology  Neurosurgery  Procedures:   None  Antimicrobials:  Anti-infectives (From admission, onward)   Start     Dose/Rate Route Frequency Ordered Stop   11/09/18 1107  bacitracin 50,000 Units in sodium chloride 0.9 % 500 mL irrigation  Status:  Discontinued       As needed 11/09/18 1107 11/09/18 1604       Objective:  Vitals:   11/12/18 0346 11/12/18 0354 11/12/18 0500 11/12/18 0722  BP: (!) 144/73 123/74  121/63  Pulse: 75 75  70  Resp: 18 18  16   Temp: 98.9 F (37.2 C) 98.9 F (37.2 C)  98.6 F (37 C)  TempSrc: Oral Oral  Oral  SpO2: 99% 100%  100%  Weight:   107.7 kg   Height:        Intake/Output Summary (Last 24 hours) at 11/12/2018 1111 Last data filed at 11/12/2018 1002 Gross per 24 hour  Intake 360 ml  Output 1750 ml  Net -1390 ml   Filed Weights   11/10/18 0515 11/11/18 0227 11/12/18 0500  Weight: 107.6 kg 107.7 kg 107.7 kg    Examination: General exam: Appears calm and comfortable  Respiratory system: Clear to auscultation. Respiratory effort normal.  Remains on room air Cardiovascular system: S1 & S2 heard, RRR. No pedal edema. Gastrointestinal system: Abdomen is nondistended, soft and nontender. Normal bowel sounds heard. Central nervous system: Alert and oriented. Speech clear  Extremities: Symmetric in appearance bilaterally  Skin: No rashes, lesions or ulcers on exposed skin  Psychiatry: Judgement and insight appear stable. Mood & affect appropriate.    Data Reviewed: I have personally reviewed following labs and imaging studies  CBC: Recent Labs  Lab 11/06/18 1651 11/07/18 0554 11/07/18 1854 11/08/18 0611 11/10/18 0303  WBC 13.5* 9.2  --  8.7 8.6  NEUTROABS 11.2*  --   --   --   --   HGB 12.7* 13.4  --  11.5* 10.7*  HCT 39.4 43.7 38.1 35.2* 31.2*  MCV 97.8 108.4*  --  98.1 95.7  PLT 261 236  --  207 505   Basic Metabolic Panel: Recent Labs  Lab 11/06/18 1651 11/07/18 0554 11/08/18 0402 11/10/18 0303  NA 141 143 140 138  K 3.7 3.9 4.0 4.2  CL 102 109 106 103  CO2 27 21* 26 28  GLUCOSE 130* 111* 128* 140*  BUN 27* 25* 22* 17  CREATININE 1.11 1.11 1.08 1.18  CALCIUM 9.1 9.0 8.6* 8.6*   GFR: Estimated Creatinine Clearance: 94.3 mL/min (by C-G formula based on SCr of 1.18 mg/dL). Liver Function Tests: Recent Labs  Lab 11/06/18 1651 11/07/18  0554 11/08/18 0402  AST 26 21 16   ALT 22 19 18   ALKPHOS 35* 29* 31*  BILITOT 0.5 QUANTITY NOT SUFFICIENT, UNABLE TO PERFORM TEST 0.9  PROT 7.7 6.7 6.0*  ALBUMIN 4.4 3.8 3.4*   Recent Labs  Lab 11/06/18 1651  LIPASE 22   No results for input(s): AMMONIA in the last 168 hours. Coagulation Profile: No results for input(s): INR, PROTIME in the last 168 hours. Cardiac Enzymes: Recent Labs  Lab 11/07/18 0554  CKTOTAL 623*  CKMB 3.6   BNP (last 3 results) No results for input(s): PROBNP in the last 8760 hours. HbA1C: No results for input(s): HGBA1C in the last 72 hours. CBG: Recent Labs  Lab 11/09/18 0825  GLUCAP 94   Lipid Profile: No results for input(s): CHOL, HDL, LDLCALC, TRIG, CHOLHDL, LDLDIRECT in the last 72 hours. Thyroid Function Tests: No results for input(s): TSH, T4TOTAL, FREET4, T3FREE, THYROIDAB in the last 72 hours. Anemia Panel: No results for input(s): VITAMINB12, FOLATE, FERRITIN, TIBC, IRON, RETICCTPCT in the last 72 hours. Sepsis Labs: No results for input(s): PROCALCITON, LATICACIDVEN in the last 168 hours.  Recent Results (from the past 240 hour(s))  SARS Coronavirus 2 by RT PCR (hospital order, performed in [facility] hospital lab) Nasopharyngeal Nasopharyngeal Swab     Status: None   Collection Time: 11/06/18  6:01 PM   Specimen: Nasopharyngeal Swab  Result Value Ref Range Status   SARS Coronavirus 2 NEGATIVE NEGATIVE Final    Comment: (NOTE) If result is NEGATIVE SARS-CoV-2 target nucleic acids are NOT DETECTED. The SARS-CoV-2 RNA is generally detectable in upper and lower  respiratory specimens during the acute phase of infection. The lowest  concentration of SARS-CoV-2 viral copies this assay can detect is 250  copies / mL. A negative result does not preclude SARS-CoV-2 infection  and should not be used as the sole basis for treatment or other  patient management decisions.  A negative result may occur with  improper specimen collection  / handling, submission of specimen other  than nasopharyngeal swab, presence of viral mutation(s) within the  areas targeted by this assay, and inadequate number of viral copies  (<250 copies / mL). A negative result must be combined with clinical  observations, patient history, and epidemiological information. If result is POSITIVE SARS-CoV-2 target nucleic acids are DETECTED. The SARS-CoV-2 RNA is generally detectable in upper and lower  respiratory specimens dur ing the acute phase of infection.  Positive  results are indicative of active infection with SARS-CoV-2.  Clinical  correlation with patient history and other diagnostic information is  necessary to determine patient infection status.  Positive results do  not rule out bacterial infection or co-infection with other viruses. If result is PRESUMPTIVE POSTIVE SARS-CoV-2 nucleic acids MAY BE PRESENT.   A presumptive positive result was obtained on the submitted specimen  and confirmed on repeat testing.  While 2019 novel coronavirus  (  SARS-CoV-2) nucleic acids may be present in the submitted sample  additional confirmatory testing may be necessary for epidemiological  and / or clinical management purposes  to differentiate between  SARS-CoV-2 and other Sarbecovirus currently known to infect humans.  If clinically indicated additional testing with an alternate test  methodology [phone]) is advised. The SARS-CoV-2 RNA is generally  detectable in upper and lower respiratory sp ecimens during the acute  phase of infection. The expected result is Negative. Fact Sheet for Patients:  BoilerBrush.com.cy Fact Sheet for Healthcare Providers: https://pope.com/ This test is not yet approved or cleared by the [place] FDA and has been authorized for detection and/or diagnosis of SARS-CoV-2 by FDA under an Emergency Use Authorization (EUA).  This EUA will remain in effect (meaning this  test can be used) for the duration of the COVID-19 declaration under Section 564(b)(1) of the Act, 21 U.S.C. section 360bbb-3(b)(1), unless the authorization is terminated or revoked sooner. Performed at [facility], [address] [place], [place] [address]   CSF culture     Status: None   Collection Time: 11/07/18  3:05 AM   Specimen: CSF; Cerebrospinal Fluid  Result Value Ref Range Status   Specimen Description CSF  Final   Special Requests TUBE 2  Final   Gram Stain   Final    CYTOSPIN SMEAR RARE WBC PRESENT, PREDOMINANTLY PMN NO ORGANISMS SEEN    Culture   Final    NO GROWTH 3 DAYS Performed at [facility], [address] [place], [place] [address]    Report Status 11/10/2018 FINAL  Final  Surgical PCR screen     Status: None   Collection Time: 11/09/18  5:34 AM   Specimen: Nasal Mucosa; Nasal Swab  Result Value Ref Range Status   MRSA, PCR NEGATIVE NEGATIVE Final   Staphylococcus aureus NEGATIVE NEGATIVE Final    Comment: (NOTE) The Xpert SA Assay (FDA approved for NASAL specimens in patients 79 years of age and older), is one component of a comprehensive surveillance program. It is not intended to diagnose infection nor to guide or monitor treatment. Performed at [facility], [address], [place], [place] [address]       Radiology Studies: Dg Chest Port 1 View  Result Date: 11/11/2018 CLINICAL DATA:  Fever EXAM: PORTABLE CHEST 1 VIEW COMPARISON:  11/06/2018 FINDINGS: The heart size and mediastinal contours are within normal limits. Both lungs are clear. The visualized skeletal structures are unremarkable. IMPRESSION: No acute abnormality of the lungs in AP portable projection. Electronically Signed   By: Lauralyn Primes M.D.   On: 11/11/2018 13:08      Scheduled Meds: . Chlorhexidine Gluconate Cloth  6 each Topical Q0600  . mupirocin ointment  1 application Nasal BID   Continuous Infusions:    LOS: 4 days      Time spent:  25 minutes   Noralee Stain, DO [facility] 11/12/2018, 11:11 AM   Available via Epic secure chat 7am-7pm After these hours, please refer to coverage provider listed on [email or web address]

## 2018-11-13 ENCOUNTER — Other Ambulatory Visit: Payer: Self-pay

## 2018-11-13 ENCOUNTER — Inpatient Hospital Stay (HOSPITAL_COMMUNITY)
Admission: RE | Admit: 2018-11-13 | Discharge: 2018-11-30 | DRG: 560 | Disposition: A | Discharge status: Home / Self Care | Payer: Self-pay | Source: Intra-hospital | Attending: Physical Medicine and Rehabilitation | Admitting: Physical Medicine and Rehabilitation

## 2018-11-13 ENCOUNTER — Encounter (HOSPITAL_COMMUNITY): Payer: Self-pay

## 2018-11-13 DIAGNOSIS — K592 Neurogenic bowel, not elsewhere classified: Secondary | ICD-10-CM | POA: Diagnosis present

## 2018-11-13 DIAGNOSIS — G8222 Paraplegia, incomplete: Secondary | ICD-10-CM | POA: Diagnosis present

## 2018-11-13 DIAGNOSIS — R29898 Other symptoms and signs involving the musculoskeletal system: Secondary | ICD-10-CM

## 2018-11-13 DIAGNOSIS — I82443 Acute embolism and thrombosis of tibial vein, bilateral: Secondary | ICD-10-CM | POA: Diagnosis present

## 2018-11-13 DIAGNOSIS — I82452 Acute embolism and thrombosis of left peroneal vein: Secondary | ICD-10-CM | POA: Diagnosis present

## 2018-11-13 DIAGNOSIS — I82451 Acute embolism and thrombosis of right peroneal vein: Secondary | ICD-10-CM | POA: Diagnosis present

## 2018-11-13 DIAGNOSIS — F17290 Nicotine dependence, other tobacco product, uncomplicated: Secondary | ICD-10-CM | POA: Diagnosis present

## 2018-11-13 DIAGNOSIS — I82441 Acute embolism and thrombosis of right tibial vein: Secondary | ICD-10-CM | POA: Diagnosis present

## 2018-11-13 DIAGNOSIS — Z4789 Encounter for other orthopedic aftercare: Principal | ICD-10-CM

## 2018-11-13 DIAGNOSIS — G834 Cauda equina syndrome: Secondary | ICD-10-CM

## 2018-11-13 DIAGNOSIS — N312 Flaccid neuropathic bladder, not elsewhere classified: Secondary | ICD-10-CM | POA: Diagnosis present

## 2018-11-13 DIAGNOSIS — K5900 Constipation, unspecified: Secondary | ICD-10-CM | POA: Diagnosis present

## 2018-11-13 DIAGNOSIS — Z981 Arthrodesis status: Secondary | ICD-10-CM

## 2018-11-13 DIAGNOSIS — R339 Retention of urine, unspecified: Secondary | ICD-10-CM | POA: Diagnosis present

## 2018-11-13 DIAGNOSIS — Z79899 Other long term (current) drug therapy: Secondary | ICD-10-CM

## 2018-11-13 DIAGNOSIS — D62 Acute posthemorrhagic anemia: Secondary | ICD-10-CM | POA: Diagnosis present

## 2018-11-13 DIAGNOSIS — M21371 Foot drop, right foot: Secondary | ICD-10-CM | POA: Diagnosis present

## 2018-11-13 DIAGNOSIS — I82453 Acute embolism and thrombosis of peroneal vein, bilateral: Secondary | ICD-10-CM | POA: Diagnosis present

## 2018-11-13 MED ORDER — ACETAMINOPHEN 650 MG RE SUPP: 650.0000 mg | Freq: Four times a day (QID) | RECTAL | Status: DC | PRN

## 2018-11-13 MED ORDER — SORBITOL 70 % SOLN: 30.0000 mL | Freq: Every day | Status: DC | PRN

## 2018-11-13 MED ORDER — MAGNESIUM CITRATE PO SOLN
1.0000 | Freq: Once | ORAL | Status: AC
  Administered 2018-11-13: 1 via ORAL
  Filled 2018-11-13: qty 296

## 2018-11-13 MED ORDER — HYDROCODONE-ACETAMINOPHEN 5-325 MG PO TABS
1.0000 | ORAL_TABLET | ORAL | Status: DC | PRN
  Administered 2018-11-13 – 2018-11-16 (×9): 2 via ORAL
  Administered 2018-11-16: 1 via ORAL
  Administered 2018-11-16 – 2018-11-20 (×12): 2 via ORAL
  Administered 2018-11-21: 1 via ORAL
  Filled 2018-11-13 (×8): qty 2
  Filled 2018-11-13: qty 1
  Filled 2018-11-13 (×10): qty 2
  Filled 2018-11-13: qty 1
  Filled 2018-11-13 (×7): qty 2

## 2018-11-13 MED ORDER — ACETAMINOPHEN 325 MG PO TABS
650.0000 mg | ORAL_TABLET | Freq: Four times a day (QID) | ORAL | Status: DC | PRN
  Administered 2018-11-14: 650 mg via ORAL
  Filled 2018-11-13: qty 2

## 2018-11-13 MED ORDER — SENNOSIDES-DOCUSATE SODIUM 8.6-50 MG PO TABS
1.0000 | ORAL_TABLET | Freq: Two times a day (BID) | ORAL | Status: DC
  Administered 2018-11-13 – 2018-11-30 (×34): 1 via ORAL
  Filled 2018-11-13 (×35): qty 1

## 2018-11-13 MED ORDER — GLYCERIN (LAXATIVE) 2.1 G RE SUPP
1.0000 | Freq: Every day | RECTAL | Status: DC | PRN
  Filled 2018-11-13: qty 1

## 2018-11-13 MED ORDER — POLYETHYLENE GLYCOL 3350 17 G PO PACK
17.0000 g | PACK | Freq: Every day | ORAL | Status: DC
  Administered 2018-11-13 – 2018-11-29 (×17): 17 g via ORAL
  Filled 2018-11-13 (×17): qty 1

## 2018-11-13 NOTE — Progress Notes (Signed)
Inpatient Rehabilitation-Admissions Coordinator   I have received medical approval from attending, Dr. Maylene Roes for admit to CIR today. Pt is in agreement for CIR and I have confirmed DC support with his sister Hilda Blades. I will update RN, CM/SW regarding plan.   Please call if questions.   Jhonnie Garner, OTR/L  Rehab Admissions Coordinator  [phone] 11/13/2018 10:50 AM

## 2018-11-13 NOTE — Progress Notes (Signed)
Patient ID: Joseph Reeves, male   DOB: 07/15/1965, 53 y.o.   MRN: [id number] Pt arrived to unit via bed approx 1840. Oriented to unit and safety precautions. Belongings at bedside along with mother Hatti- visitor. Informed on visitor policy including proper masking. Call bell placed in reach. Will cont to monitor.   [place] Noe, RN

## 2018-11-13 NOTE — TOC Transition Note (Signed)
Transition of Care [facility]) - CM/SW Discharge Note   Patient Details  Name: Joseph Reeves MRN: [id number] Date of Birth: 1965-11-20  Transition of Care [facility]) CM/SW Contact:  Pollie Friar, RN Phone Number: 11/13/2018, 10:57 AM   Clinical Narrative:    Patient is discharging to CIR today. No further needs per TOC.    Final next level of care: IP Rehab Facility Barriers to Discharge: Inadequate or no insurance, Barriers Unresolved (comment)   Patient Goals and CMS Choice        Discharge Placement                       Discharge Plan and Services                                     Social Determinants of Health (SDOH) Interventions     Readmission Risk Interventions No flowsheet data found.

## 2018-11-13 NOTE — Progress Notes (Signed)
[name], MD  Physician  Physical Medicine and Rehabilitation  PMR Pre-admission  Signed  Date of Service:  11/10/2018 6:02 PM      Related encounter: ED to Hosp-Admission (Current) from 11/06/2018 in [facility]      Signed         PMR Admission Coordinator Pre-Admission Assessment  Patient: Joseph Reeves is an 53 y.o., male MRN: [id number] DOB: 05-31-1965 Height: _0  (185.4 cm) Weight: 107.7 kg  Insurance Information HMO:     PPO:      PCP:      IPA:      80/20:      OTHER:  PRIMARY: Uninsured (self pay)      Policy#:       Subscriber:  CM Name:       Phone#:      Fax#:  Pre-Cert#:       Employer: Benefits:  Phone #:      Name:  Eff. Date:     Deduct:       Out of Pocket Max:       Life Max:  CIR: pt is aware of estimated daily cost of care ($3,500). I have contacted financial counselor Shanon Rosser to screen for Medicaid/disability.      SNF:  Outpatient:      Co-Pay:  Home Health:       Co-Pay:  DME:      Co-Pay:  Providers:  SECONDARY:       Policy#:       Subscriber:  CM Name:       Phone#:      Fax#:  Pre-Cert#:       Employer:  Benefits:  Phone #:      Name:  Eff. Date:      Deduct:       Out of Pocket Max:       Life Max:  CIR:       SNF:  Outpatient:      Co-Pay: Home Health:       Co-Pay:  DME:      Co-Pay:   Medicaid Application Date:       Case Manager:  Disability Application Date:       Case Worker:   The Data Collection Information Summary for patients in Inpatient Rehabilitation Facilities with attached Privacy Act [place] Records was provided and verbally reviewed with: N/A  Emergency Contact Information         Contact Information    Name Relation Home Work Mobile   [name],[name] Significant other [phone]        Current Medical History  Patient Admitting Diagnosis: 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 He has a sister in [place] as well as another sister in [place] with good support. Presented 11/07/2018 with numbness in his buttocks bilaterally as well as legs and feet. The numbness in his right leg and foot are greater than the left leg. He also describes weakness in the lower extremities right greater than left. Patient also noticed some difficulty with urination. CT/MRI of the brain unremarkable. Sedimentation rate within normal limits at 15,  SARS coronavirus negative, urine drug screen positive benzos. LP completed CSF inconclusive due to bloody tap. 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 Underwent L2-3 lumbar decompression including L2 and L3 laminectomy, L2-3 discectomy, 2-3 foraminotomy and decompression of central canal 11/09/2018 per Dr. Sherwood Gambler. Hospital course pain management. Acute blood loss anemia 10.7. Patient with low-grade fever 101 urine study negativeand chest x-ray showed no active disease. Blood cultures no growth to date. Patient with bouts of urinary retention with residuals greater than 900 and a Foley tube was placed. Therapy evaluations completed back brace when out of bed. Patient is to be admitted for a comprehensive rehab program on 11/13/2018.  Patient's [facility] from [facility] has been reviewed by the rehabilitation admission coordinator and physician.  Past Medical History      Past Medical History:  Diagnosis Date   Sciatica     Family History   family history  includes Heart failure in his father; Hypertension in his father.  Prior Rehab/Hospitalizations Has the patient had prior rehab or hospitalizations prior to admission? No  Has the patient had major surgery during 100 days prior to admission? Yes             Current Medications  Current Facility-Administered Medications:    acetaminophen (TYLENOL) tablet 650 mg, 650 mg, Oral, Q6H PRN, 650 mg at 11/11/18 2103 **OR** acetaminophen (TYLENOL) suppository 650 mg, 650 mg, Rectal, Q6H PRN, Jani Gravel, MD   Chlorhexidine Gluconate Cloth 2 % PADS 6 each, 6 each, Topical, Q0600, Dessa Phi, DO, 6 each at 11/13/18 0531   docusate sodium (COLACE) capsule 100 mg, 100 mg, Oral, BID PRN, Dessa Phi, DO, 100 mg at 11/12/18 2312   Glycerin (Adult) 2.1 g suppository 1 suppository, 1 suppository, Rectal, Daily PRN, Dessa Phi, DO, 1 suppository at 11/13/18 0630   HYDROcodone-acetaminophen (NORCO/VICODIN) 5-325 MG per tablet 1-2 tablet, 1-2 tablet, Oral, Q4H PRN, Jovita Gamma, MD, 2 tablet at 11/13/18 1045   mupirocin ointment (BACTROBAN) 2 % 1 application, 1 application, Nasal, BID, Dessa Phi, DO, 1 application at 07/37/10 0937   phenol (CHLORASEPTIC) mouth spray 1 spray, 1 spray, Mouth/Throat, PRN, Pokhrel, Laxman, MD, 1 spray at 11/07/18 2336   polyethylene glycol (MIRALAX / GLYCOLAX) packet 17 g, 17 g, Oral, Daily, Dessa Phi, DO, 17 g at 11/13/18 6269  Patients Current Diet:     Diet Order                  Diet regular Room service appropriate? Yes; Fluid consistency: Thin  Diet effective now               Precautions / Restrictions Precautions Precautions: (P) Back, Fall Precaution Booklet Issued: (P) No Precaution Comments: (P) reviewed bed mob Spinal Brace: (P) Lumbar corset, Applied in sitting position Restrictions Weight Bearing Restrictions: (P) No   Has the patient had 2 or more falls or a fall with injury in the past year? Yes  Prior  Activity Level Community (5-7x/wk): very Independent PTA; works for own company, drives, no AD use. Lives in [place] and was down here for a training seminar with work  Prior Functional Level Self Care: Did the patient need help bathing, dressing, using the toilet or eating? Independent  Indoor Mobility: Did the patient need assistance with walking from room to room (with or without device)? Independent  Stairs:  Did the patient need assistance with internal or external stairs (with or without device)? Independent  Functional Cognition: Did the patient need help planning regular tasks such as shopping or remembering to take medications? Independent  Home Assistive Devices / Equipment Home Assistive Devices/Equipment: None Home Equipment: Cane - single point  Prior Device Use: Indicate devices/aids used by the patient prior to current illness, exacerbation or injury? None of the above  Current Functional Level Cognition  Overall Cognitive Status: (P) Within Functional Limits for tasks assessed Orientation Level: Oriented X4 General Comments: (P) pt is tearful at one point about the changes in his condition    Extremity Assessment (includes Sensation/Coordination)  Upper Extremity Assessment: Overall WFL for tasks assessed  Lower Extremity Assessment: RLE deficits/detail RLE Deficits / Details: right leg more impaired than L leg with R ankle 1/5 DF, knee 2/5, hip 2/5 RLE Sensation: decreased light touch RLE Coordination: decreased gross motor LLE Deficits / Details: strength at least 3/5 throughout.   LLE Sensation: decreased light touch LLE Coordination: decreased gross motor    ADLs  Overall ADL's : Needs assistance/impaired Eating/Feeding: Independent Grooming: Wash/dry hands, Wash/dry face, Oral care, Set up, Sitting Upper Body Bathing: Set up, Sitting Lower Body Bathing: Moderate assistance, Sitting/lateral leans Upper Body Dressing : Minimal assistance,  Sitting Lower Body Dressing: Maximal assistance, Total assistance Toilet Transfer Details (indicate cue type and reason): stedy lift    Mobility  Overal bed mobility: (P) Needs Assistance Bed Mobility: (P) Rolling, Sidelying to Sit, Sit to Sidelying Rolling: Mod assist Sidelying to sit: Mod assist Supine to sit: Min guard General bed mobility comments: needed mod A for bridging of knees and maintaining that position. Use of rail for rolling and mod A for LE's off bed. Mod HHA for elevation of trunk into sitting. Pt with increase in pain as soon as wt onto hips    Transfers  Overall transfer level: Needs assistance Equipment used: Ambulation equipment used Transfer via Lift Equipment: Stedy Transfers: Sit to/from Stand Sit to Stand: From elevated surface, Min assist, Mod assist General transfer comment: pt needed mod A for sit<>stand from bed, min A from elevated surface of stedy. Practiced sit<>stand 3x within stedy, heavy use of UE's    Ambulation / Gait / Stairs / Wheelchair Mobility  Ambulation/Gait General Gait Details: B knee buckling, unable to pick up feet in [place] to take steps. Worked on pregait wt shifting in standing in stedy    Posture / Balance Dynamic Sitting Balance Sitting balance - Comments: Pt unweighting his back in sitting with bil UEs.  Supervision EOB.  Balance Overall balance assessment: Needs assistance Sitting-balance support: Feet supported, Bilateral upper extremity supported Sitting balance-Leahy Scale: Fair Sitting balance - Comments: Pt unweighting his back in sitting with bil UEs.  Supervision EOB.  Standing balance support: Bilateral upper extremity supported Standing balance-Leahy Scale: Poor Standing balance comment: once standing in steady, no physical support needed, however, steady blocking both knees and pt using UEs for support.     Special needs/care consideration BiPAP/CPAP : no CPM : no Continuous Drip IV : no Dialysis : no         Days : no Life Vest : no Oxygen : no Special Bed : no Trach Size : no Wound Vac (area) : no      Location : no Skin: surgical incision to back                  Bowel mgmt: last BM: 11/05/2018  Bladder mgmt: urinary catheter in place, incontinent  Diabetic mgmt: : no Behavioral consideration : no Chemo/radiation : no   Previous Home Environment (from acute therapy documentation) Living Arrangements: Spouse/significant other Available Help at Discharge: Family Type of Home: House Home Layout: Multi-level Alternate Level Stairs-Rails: Right Alternate Level Stairs-Number of Steps: flight Home Access: Stairs to enter Entrance Stairs-Rails: None Entrance Stairs-Number of Steps: 2 Home Care Services: No  Discharge Living Setting Plans for Discharge Living Setting: Other; sister's house in [place] (4 hours from here). Type of Home at Discharge: house Discharge Home Layout: one story  Discharge Home Access: stairs, one step to enter (low step) Discharge Bathroom Shower/Tub: tub/shower Discharge Bathroom Toilet: Standard Discharge Bathroom Accessibility: Yes How Accessible: RW Does the patient have any problems obtaining your medications?: No  Social/Family/Support Systems Patient Roles: Other (Comment)(lives with significant other; works, drives) Sport and exercise psychologist Information: significant other: Michiel Sites [phone]). Pt will be staying with his sister at [place] Anticipated Caregiver: Sister: Hilda Blades [phone]; [phone] Anticipated Caregiver's Contact Information: see above Ability/Limitations of Caregiver:  Caregiver Availability: 24/7 (sister works from home) Discharge Plan Discussed with Primary Caregiver: Yes Is Caregiver In Agreement with Plan?: Yes Does Caregiver/Family have Issues with Lodging/Transportation while Pt is in Rehab?: No  Goals/Additional Needs Patient/Family Goal for Rehab: PT/OT: Min A; SLP: NA Expected length of stay: 14-18 days Cultural Considerations:  NA Dietary Needs: regular diet, thin liquids Equipment Needs: TBD Pt/Family Agrees to Admission and willing to participate: Yes Program Orientation Provided & Reviewed with Pt/Caregiver Including Roles  & Responsibilities: Yes(pt and his sister Hilda Blades)  Barriers to Discharge: 1 step to enter; needs to be one person Min/mod A; bowel and bladder management. No insurance for SNF placement.   Decrease burden of Care through IP rehab admission: NA  Possible need for SNF placement upon discharge: Not anticipated; pt is uninsured and cannot be placed in SNF. Pt has good social support from family with his sister committed to assisting with his care at DC. Anticipate pt will be able to get to a one person assist for a safe DC.   Patient Condition: I have reviewed medical records from [facility], spoken with CM, and patient and family member (sister). I met with patient at the bedside for inpatient rehabilitation assessment.  Patient will benefit from ongoing PT and OT, can actively participate in 3 hours of therapy a day 5 days of the week, and can make measurable gains during the admission.  Patient will also benefit from the coordinated team approach during an Inpatient Acute Rehabilitation admission.  The patient will receive intensive therapy as well as Rehabilitation physician, nursing, social worker, and care management interventions.  Due to bladder management, bowel management, safety, skin/wound care, disease management, medication administration, pain management and patient education the patient requires 24 hour a day rehabilitation nursing.  The patient is currently Min/Mod A transfers with ambulation equipment; no mobility tested at this time; and Min to Max A for basic ADLs.  Discharge setting and therapy post discharge at home with home health is anticipated.  Patient has agreed to participate in the Acute Inpatient Rehabilitation Program and will admit today.  Preadmission  Screen Completed By:  Raechel Ache, 11/13/2018 11:56 AM ______________________________________________________________________   Discussed status with Dr. Dagoberto Ligas on 11/13/2018 at 11:55AM and received approval for admission today.  Admission Coordinator:  Raechel Ache, OT, time 11:55AM/Date 11/13/2018   Assessment/Plan: Diagnosis: 1. Does the need for close, 24 hr/day Medical supervision in concert with the patient's  rehab needs make it unreasonable for this patient to be served in a less intensive setting? Yes 2. Co-Morbidities requiring supervision/potential complications: Neurogenic bowel and bladder, post op pain, SCI,  3. Due to bladder management, bowel management, safety, medication administration, pain management and patient education, does the patient require 24 hr/day rehab nursing? Yes 4. Does the patient require coordinated care of a physician, rehab nurse, PT, OT, and SLP to address physical and functional deficits in the context of the above medical diagnosis(es)? Yes Addressing deficits in the following areas: balance, endurance, locomotion, strength, transferring, bowel/bladder control, bathing, dressing, grooming, toileting and psychosocial support 5. Can the patient actively participate in an intensive therapy program of at least 3 hrs of therapy 5 days a week? Yes 6. The potential for patient to make measurable gains while on inpatient rehab is excellent 7. Anticipated functional outcomes upon discharge from inpatient rehab: min assist PT, min assist OT, n/a SLP 8. Estimated rehab length of stay to reach the above functional goals is: 14-18 days 9. Anticipated discharge destination: Home 10. Overall Rehab/Functional Prognosis: good   MD Signature:         Revision History Date/Time User Provider Type Action  11/13/2018 12:46 PM [name], MD Physician Sign  11/13/2018 11:57 AM Raechel Ache, OT Rehab Admission Coordinator Share  View Details Report

## 2018-11-13 NOTE — Progress Notes (Signed)
Vitals:   11/13/18 0339 11/13/18 0907 11/13/18 1431 11/13/18 1534  BP: 121/69 128/70 130/70 130/80  Pulse: 79 76 73 78  Resp: 18 18 18 16   Temp: 99 F (37.2 C) 98.8 F (37.1 C) 99.2 F (37.3 C) 98.8 F (37.1 C)  TempSrc: Oral Oral Oral Oral  SpO2: 99% 95% 98% 98%  Weight:      Height:        Patient resting comfortably in bed.  Just had a bowel movement.  Seen doing therapies, awaiting transfer to inpatient rehab.  Has continued PT and OT.  Continued gradual motor recovery, again more so in distal left lower extremity as compared to distal right lower extremity: Left dorsiflexor 3, right dorsiflexor 2, left plantar flexor 5, right plantar flexor 2.  Plan: Doing well postoperatively.  Continued gradual motor recovery.  Spoke with patient and his mother, who was at the bedside, and will follow up again later in the week.  Hosie Spangle, MD 11/13/2018, 4:36 PM

## 2018-11-13 NOTE — Progress Notes (Signed)
Physical Therapy Treatment Patient Details Name: Joseph Reeves MRN: [id number] DOB: 08-Nov-1965 Today's Date: 11/13/2018    History of Present Illness Pt is a 53 y.o. M with no significant PMH of presents with LBP radiating down BLE with associated weakness and sensory symptoms. MRI brain negative. No transverse myelitis on T-spine MRI. 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 Pt s/p L2-3 decompression and fusion on 11/09/18.      PT Comments    Pt was up to stand at [facility] with knee support and worked on extending R knee actively with a little release of the pressure.  Pt is starting to get a better feel for how to control mm's in legs, but is so weak in standing that he would be best seen with two if working like this.  Pt is heading to CIR so will sign off if he is discharged today, but otherwise will see again tomorrow.   Follow Up Recommendations  CIR     Equipment Recommendations  Rolling walker with 5" wheels;3in1 (PT)    Recommendations for Other Services Rehab consult     Precautions / Restrictions Precautions Precautions: Back;Fall Precaution Booklet Issued: No Precaution Comments: reviewed bed mob Required Braces or Orthoses: Spinal Brace Spinal Brace: Lumbar corset;Applied in sitting position Restrictions Weight Bearing Restrictions: No    Mobility  Bed Mobility Overal bed mobility: Needs Assistance Bed Mobility: Rolling;Sidelying to Sit;Sit to Sidelying Rolling: Min assist Sidelying to sit: Mod assist     Sit to sidelying: Mod assist General bed mobility comments: sensitivity of pain with all but worse sitting, better with brace in place  Transfers Overall transfer level: Needs assistance Equipment used: Rolling walker (2 wheeled);1 person hand held assist Transfers: Sit to/from Stand Sit to Stand: From elevated surface;Mod assist(PT used her knee to support R knee)         General transfer comment: very reliant on UE's for  walker  Ambulation/Gait             General Gait Details: knees are too unsteady to walk   Stairs             Wheelchair Mobility    Modified Rankin (Stroke Patients Only)       Balance   Sitting-balance support: Feet supported;Bilateral upper extremity supported Sitting balance-Leahy Scale: Fair Sitting balance - Comments: able to sit with supervision but requires help to scoot back on the bed     Standing balance-Leahy Scale: Poor Standing balance comment: used Knee to support R knee but pt is awkward with releasing legs to sit                            Cognition Arousal/Alertness: Awake/alert Behavior During Therapy: WFL for tasks assessed/performed Overall Cognitive Status: Within Functional Limits for tasks assessed                                 General Comments: pt is tearful at one point about the changes in his condition      Exercises General Exercises - Lower Extremity Long Arc Quad: Strengthening;AAROM;Both;10 reps Heel Slides: AAROM;Strengthening;Both;10 reps    General Comments General comments (skin integrity, edema, etc.): pt is shifting hips forward in standing before trying to sit down on the bed, unsure of reason for this as PT is cuing a straight transition to sit on the bed, very  unsafe      Pertinent Vitals/Pain Pain Assessment: 0-10 Pain Score: 7  Pain Location: thighs and back Pain Descriptors / Indicators: Cramping;Operative site guarding Pain Intervention(s): Limited activity within patient's tolerance;Monitored during session;Premedicated before session;Repositioned;RN gave pain meds during session    Home Living                      Prior Function            PT Goals (current goals can now be found in the care plan section) Acute Rehab PT Goals Patient Stated Goal: to get pain meds and manage his weakness Progress towards PT goals: Progressing toward goals    Frequency    Min  5X/week      PT Plan Current plan remains appropriate    Co-evaluation              AM-PAC PT "6 Clicks" Mobility   Outcome Measure  Help needed turning from your back to your side while in a flat bed without using bedrails?: A Little Help needed moving from lying on your back to sitting on the side of a flat bed without using bedrails?: A Lot Help needed moving to and from a bed to a chair (including a wheelchair)?: A Lot Help needed standing up from a chair using your arms (e.g., wheelchair or bedside chair)?: A Lot Help needed to walk in hospital room?: Total Help needed climbing 3-5 steps with a railing? : Total 6 Click Score: 11    End of Session Equipment Utilized During Treatment: Gait belt;Back brace Activity Tolerance: Patient limited by pain Patient left: in bed;with call bell/phone within reach;with bed alarm set;with nursing/sitter in room Nurse Communication: Mobility status;Other (comment);Need for lift equipment(positioning with pillows for bed support of spine) PT Visit Diagnosis: Muscle weakness (generalized) (M62.81);Difficulty in walking, not elsewhere classified (R26.2);Pain Pain - Right/Left: (Bilateral) Pain - part of body: (outer thighs and spine)     Time: 9147-8295 PT Time Calculation (min) (ACUTE ONLY): 38 min  Charges:  $Therapeutic Exercise: 8-22 mins $Therapeutic Activity: 23-37 mins                    Ivar Drape 11/13/2018, 12:01 PM   Samul Dada, PT MS Acute Rehab Dept. Number: [facility] [id number] and [facility] [phone]

## 2018-11-13 NOTE — Discharge Summary (Signed)
Physician Discharge Summary  Joseph Reeves KNL:[id number] DOB: [birth year] DOA: 11/06/2018  PCP: Patient, No Pcp Per  Admit date: 11/06/2018 Discharge date: 11/13/2018  Admitted From: Home Disposition:  CIR   Recommendations for Outpatient Follow-up:  1. Follow up with Dr. Newell Coral as scheduled   Discharge Condition: Stable CODE STATUS: Full  Diet recommendation: Regular   Brief/Interim Summary: Joseph Reeves is a 53 year old male who lives in [place] and visiting [place] due to work presents with numbness and tingling of bilateral legs since Monday.  Symptoms have progressed to include weakness and significantly getting worse.  He has been evaluated by emergency department 3 times in the past week.  Patient underwent lumbar puncture which resulted in a bloody tap.  He also underwent MRI of thoracic and lumbar spine.  Neurosurgery and neurology consulted. Patient eventually underwent L2-L3 decompression and laminectomy 10/22 by Dr. Newell Coral. PT OT recommended CIR on discharge.   Discharge Diagnoses:  Principal Problem:   Leg weakness Active Problems:   Numbness and tingling of both legs    Numbness, tingling, weakness of the bilateral lower extremities secondary to L2-L3 disc herniation -Neurology evaluated patient during hospitalization and has recommended further neurosurgery evaluation -Status post L2-L3 decompression and laminectomy 10/22 by Dr. 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  Monitor off of antibiotics -Encourage incentive spirometer  Constipation/bowel retention  -No relief with suppository. Trial mag citrate    Discharge Instructions  Discharge Instructions    Increase activity slowly    Complete by: As directed      Allergies as of 11/13/2018   No Known Allergies     Medication List    STOP taking these medications   predniSONE 20 MG tablet Commonly known as: DELTASONE     TAKE these medications   diazepam 5 MG tablet Commonly known as: Valium Take 1 tablet (5 mg total) by mouth every 6 (six) hours as needed for anxiety (spasms).   meloxicam 15 MG tablet Commonly known as: Mobic Take 1 tablet daily for back pain.   oxyCODONE-acetaminophen 5-325 MG tablet Commonly known as: Percocet Take 1 tablet by mouth every 4 (four) hours as needed for severe pain.      Follow-up Information    Shirlean Kelly, MD Follow up.   Specialty: Neurosurgery Contact information: [address] [place] [address] [phone]          No Known Allergies  Consultations:  Neurology  Neurosurgery    Procedures/Studies: Dg Chest 2 View  Result Date: 11/06/2018 CLINICAL DATA:  Leg weakness, light smoker EXAM: CHEST - 2 VIEW COMPARISON:  None FINDINGS: Normal heart size, mediastinal contours, and pulmonary vascularity. Lungs clear. No pleural effusion or pneumothorax. Bones unremarkable. IMPRESSION: Normal exam.  A Electronically Signed   By: Ulyses Southward M.D.   On: 11/06/2018 18:18   Dg Lumbar Spine 2-3 Views  Result Date: 11/09/2018 CLINICAL DATA:  L2-3 PLIF EXAM: DG C-ARM 1-60 MIN; LUMBAR SPINE - 2-3 VIEW COMPARISON:  Intraoperative films from earlier in the same day. FLUOROSCOPY TIME:  Fluoroscopy Time:  48 seconds Radiation Exposure Index (if provided by the fluoroscopic device): Not available Number of Acquired Spot Images: 2 FINDINGS: Interbody fusion at L2-3 is noted with pedicle screws and posterior fixation. IMPRESSION: L2-3 fusion  Electronically Signed   By: Alcide Clever M.D.   On: 11/09/2018 15:43   Dg Lumbar Spine 2-3 Views  Result Date: 11/09/2018 CLINICAL DATA:  Intraoperative localization EXAM: LUMBAR SPINE - 2-3 VIEW  COMPARISON:  11/07/2018 FINDINGS: Lateral views of the lumbar spine were obtained. Initial image demonstrates needles within the posterior soft tissues at the L1 and L3 levels. Subsequent lateral few shows surgical retractors and instruments at the L2-3 level. IMPRESSION: Intraoperative localization at L2-3. Electronically Signed   By: Alcide Clever M.D.   On: 11/09/2018 12:56   Ct Head Wo Contrast  Result Date: 11/06/2018 CLINICAL DATA:  Lower extremity weakness EXAM: CT HEAD WITHOUT CONTRAST TECHNIQUE: Contiguous axial images were obtained from the base of the skull through the vertex without intravenous contrast. COMPARISON:  MRI 11/04/2018 FINDINGS: Brain: No acute territorial infarction, hemorrhage or intracranial mass. The ventricles are nonenlarged. Vascular: No hyperdense vessel or unexpected calcification. Skull: Normal. Negative for fracture or focal lesion. Sinuses/Orbits: No acute finding. Other: None IMPRESSION: Negative non contrasted CT appearance of the brain. Electronically Signed   By: Jasmine Pang M.D.   On: 11/06/2018 17:53   Mr Laqueta Jean ZO Contrast  Result Date: 11/07/2018 CLINICAL DATA:  Bilateral lower extremity weakness. EXAM: MRI HEAD WITHOUT AND WITH CONTRAST TECHNIQUE: Multiplanar, multiecho pulse sequences of the brain and surrounding structures were obtained without and with intravenous contrast. CONTRAST:  10mL GADAVIST GADOBUTROL 1 MMOL/ML IV SOLN COMPARISON:  Head CT yesterday FINDINGS: Brain: The brain has a normal appearance without evidence of malformation, atrophy, old or acute small or large vessel infarction, mass lesion, hemorrhage, hydrocephalus or extra-axial collection. Vascular: Major vessels at the base of the brain show flow. Venous sinuses appear patent. Skull and upper cervical spine: Normal. Sinuses/Orbits: Mucosal thickening of the right maxillary sinus. Other sinuses clear. Other: Fluid in the posterior nasopharynx, reason not certain. IMPRESSION: Normal  brain MRI. No abnormality seen to explain the clinical presentation. Electronically Signed   By: Paulina Fusi M.D.   On: 11/07/2018 17:40   Mr Lumbar Spine Wo Contrast  Result Date: 11/04/2018 CLINICAL DATA:  Severe low back pain with bilateral lower extremity numbness EXAM: MRI LUMBAR SPINE WITHOUT CONTRAST TECHNIQUE: Multiplanar, multisequence MR imaging of the lumbar spine was performed. No intravenous contrast was administered. COMPARISON:  None. FINDINGS: Segmentation:  Standard. Alignment: Straightening of the lumbar lordosis without static listhesis. Vertebrae: No fracture, evidence of discitis, or bone lesion. Mild diffuse intrinsic canal narrowing on the basis of congenitally short pedicles. Multilevel degenerative endplate spurring. Conus medullaris and cauda equina: Conus extends to the L1 level. Conus and cauda equina appear normal. Paraspinal and other soft tissues: Partially visualized large T2 hyperintense structure within the pelvis, likely a distended urinary bladder. Disc levels: T12-L1: Mild bilateral facet arthrosis. No disc protrusion. No foraminal or canal stenosis. L1-L2: Diffuse disc bulge with endplate spurring and mild bilateral facet arthrosis results in mild canal stenosis and minimal bilateral foraminal narrowing without evidence of neural impingement. L2-L3: Central disc extrusion with 6 mm caudal extension. Degenerative endplate spurring and mild bilateral facet arthrosis contribute to severe canal stenosis and moderate right foraminal stenosis. L3-L4: Diffuse disc bulge with endplate spurring and mild bilateral facet arthrosis resulting in severe right and moderate left foraminal stenosis. Mild canal stenosis. L4-L5: Diffuse disc bulge and degenerative endplate spurring with mild bilateral facet arthrosis result in severe bilateral foraminal stenosis with mild canal stenosis. L5-S1: Small central disc protrusion and mild bilateral facet arthrosis no significant foraminal  or canal  stenosis. IMPRESSION: 1. 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 2. Multilevel foraminal stenosis, severe on the right at L3-4 and bilaterally at L4-5. 3. Partially visualized large T2 hyperintense structure within the pelvis, likely a distended urinary bladder. Electronically Signed   By: Duanne Guess M.D.   On: 11/04/2018 16:03   Mr Thoracic Spine W Wo Contrast  Result Date: 11/07/2018 CLINICAL DATA:  Numbness, tingling and paresthesia of the extremities. EXAM: MRI THORACIC WITHOUT AND WITH CONTRAST TECHNIQUE: Multiplanar and multiecho pulse sequences of the thoracic spine were obtained without and with intravenous contrast. CONTRAST:  10mL GADAVIST GADOBUTROL 1 MMOL/ML IV SOLN COMPARISON:  None. FINDINGS: MRI THORACIC SPINE FINDINGS Alignment:  Normal Vertebrae: Normal Cord:  No primary cord lesion.  See below regarding stenosis. Paraspinal and other soft tissues: Negative Disc levels: No abnormality from T1-2 through T4-5. T5-6: Minor left-sided disc bulge.  No compressive stenosis. T6-7: Normal. T7-8: Central to slightly right-sided disc herniation, narrowing the ventral subarachnoid space but not compressing the cord. No compressive foraminal narrowing. T8-9: Normal interspace. T9-10: Minor left-sided disc bulge.  No compressive stenosis. T10-11: Right paracentral disc bulge.  No compressive stenosis. T11-12 and T12-L1: Normal. IMPRESSION: Degenerative changes as outlined above. The only potentially symptomatic finding is at T7-8 where there is a central to right-sided disc herniation. This narrows the ventral subarachnoid space and contacts the ventral cord, but ample subarachnoid spaces present dorsal to the cord in there does not appear to be any cord compression or abnormal cord signal. None the less, this finding could be symptomatic. Electronically Signed   By: Paulina Fusi M.D.    On: 11/07/2018 17:45   Mr Lumbar Spine W Wo Contrast  Result Date: 11/07/2018 CLINICAL DATA:  Lower extremity numbness. EXAM: MRI LUMBAR SPINE WITHOUT AND WITH CONTRAST TECHNIQUE: Multiplanar and multiecho pulse sequences of the lumbar spine were obtained without and with intravenous contrast. CONTRAST:  10mL GADAVIST GADOBUTROL 1 MMOL/ML IV SOLN COMPARISON:  11/04/2018 FINDINGS: Segmentation:  5 lumbar type vertebral bodies. Alignment:  Straightening of the normal lumbar lordosis. Vertebrae:  No fracture or primary bone lesion. Conus medullaris and cauda equina: Conus extends to the T12-L1 level. Conus and cauda equina appear normal. Paraspinal and other soft tissues: Distended bladder Disc levels: L1-2: Circumferential bulging of the disc. Mild stenosis without visible neural compression. L2-3: Broad-based disc herniation with caudal migration behind upper L3. Severe spinal stenosis with effacement of the subarachnoid space and likely nerve compression. L3-4: Bulging of the disc. Mild stenosis of both lateral recesses and foramina without definite neural compression. L4-5: Bulging of the disc more towards the left. Mild stenosis of both lateral recesses. Left foraminal narrowing with some potential for neural compression. L5-S1: Disc bulge. Mild facet hypertrophy. No compressive stenosis. 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 This is quite likely the significant lesion in this case. Disc bulges at L1-2, L3-4 and L4-5 as noted above with lateral recess and foraminal stenoses but no definite neural compression. Electronically Signed   By: Paulina Fusi M.D.   On: 11/07/2018 17:51   Dg Chest Port 1 View  Result Date: 11/11/2018 CLINICAL DATA:  Fever EXAM: PORTABLE CHEST 1 VIEW COMPARISON:  11/06/2018 FINDINGS: The heart size and mediastinal contours are within normal limits.  Both lungs are clear.  The visualized skeletal structures are unremarkable. IMPRESSION: No acute abnormality of the lungs in AP portable projection. Electronically Signed   By: Lauralyn PrimesAlex  Bibbey M.D.   On: 11/11/2018 13:08   Dg C-arm 1-60 Min  Result Date: 11/09/2018 CLINICAL DATA:  L2-3 PLIF EXAM: DG C-ARM 1-60 MIN; LUMBAR SPINE - 2-3 VIEW COMPARISON:  Intraoperative films from earlier in the same day. FLUOROSCOPY TIME:  Fluoroscopy Time:  48 seconds Radiation Exposure Index (if provided by the fluoroscopic device): Not available Number of Acquired Spot Images: 2 FINDINGS: Interbody fusion at L2-3 is noted with pedicle screws and posterior fixation. IMPRESSION: L2-3 fusion Electronically Signed   By: Alcide CleverMark  Lukens M.D.   On: 11/09/2018 15:43       Discharge Exam: Vitals:   11/13/18 0339 11/13/18 0907  BP: 121/69 128/70  Pulse: 79 76  Resp: 18 18  Temp: 99 F (37.2 C) 98.8 F (37.1 C)  SpO2: 99% 95%    General: Pt is alert, awake, not in acute distress Cardiovascular: RRR, S1/S2 +, no edema Respiratory: CTA bilaterally, no wheezing, no rhonchi, no respiratory distress, no conversational dyspnea  Abdominal: Soft, NT, ND, bowel sounds + Extremities: no edema, no cyanosis Psych: Normal mood and affect, stable judgement and insight     The results of significant diagnostics from this hospitalization (including imaging, microbiology, ancillary and laboratory) are listed below for reference.     Microbiology: Recent Results (from the past 240 hour(s))  SARS Coronavirus 2 by RT PCR (hospital order, [facility]) Nasopharyngeal Nasopharyngeal Swab     Status: None   Collection Time: 11/06/18  6:01 PM   Specimen: Nasopharyngeal Swab  Result Value Ref Range Status   SARS Coronavirus 2 NEGATIVE NEGATIVE Final    Comment: (NOTE) If result is NEGATIVE SARS-CoV-2 target nucleic acids are NOT DETECTED. The SARS-CoV-2 RNA is generally detectable in upper and lower   respiratory specimens during the acute phase of infection. The lowest  concentration of SARS-CoV-2 viral copies this assay can detect is 250  copies / mL. A negative result does not preclude SARS-CoV-2 infection  and should not be used as the sole basis for treatment or other  patient management decisions.  A negative result may occur with  improper specimen collection / handling, submission of specimen other  than nasopharyngeal swab, presence of viral mutation(s) within the  areas targeted by this assay, and inadequate number of viral copies  (<250 copies / mL). A negative result must be combined with clinical  observations, patient history, and epidemiological information. If result is POSITIVE SARS-CoV-2 target nucleic acids are DETECTED. The SARS-CoV-2 RNA is generally detectable in upper and lower  respiratory specimens dur ing the acute phase of infection.  Positive  results are indicative of active infection with SARS-CoV-2.  Clinical  correlation with patient history and other diagnostic information is  necessary to determine patient infection status.  Positive results do  not rule out bacterial infection or co-infection with other viruses. If result is PRESUMPTIVE POSTIVE SARS-CoV-2 nucleic acids MAY BE PRESENT.   A presumptive positive result was obtained on the submitted specimen  and confirmed on repeat testing.  While 2019 novel coronavirus  (SARS-CoV-2) nucleic acids may be present in the submitted sample  additional confirmatory testing may be necessary for epidemiological  and / or clinical management purposes  to differentiate between  SARS-CoV-2 and other Sarbecovirus currently known to infect humans.  If clinically indicated additional testing with an alternate test  methodology [phone]) is advised. The SARS-CoV-2 RNA is generally  detectable in upper and lower respiratory sp ecimens during the acute  phase of infection. The expected result is Negative. Fact  Sheet for Patients:  BoilerBrush.com.cy Fact Sheet for Healthcare Providers: https://pope.com/ This test is not yet approved or cleared by the [place] FDA and has been authorized for detection and/or diagnosis of SARS-CoV-2 by FDA under an Emergency Use Authorization (EUA).  This EUA will remain in effect (meaning this test can be used) for the duration of the COVID-19 declaration under Section 564(b)(1) of the Act, 21 U.S.C. section 360bbb-3(b)(1), unless the authorization is terminated or revoked sooner. Performed at [facility], [address] [place], [place] [address]   CSF culture     Status: None   Collection Time: 11/07/18  3:05 AM   Specimen: CSF; Cerebrospinal Fluid  Result Value Ref Range Status   Specimen Description CSF  Final   Special Requests TUBE 2  Final   Gram Stain   Final    CYTOSPIN SMEAR RARE WBC PRESENT, PREDOMINANTLY PMN NO ORGANISMS SEEN    Culture   Final    NO GROWTH 3 DAYS Performed at [facility], [address] [place], [place] [address]    Report Status 11/10/2018 FINAL  Final  Surgical PCR screen     Status: None   Collection Time: 11/09/18  5:34 AM   Specimen: Nasal Mucosa; Nasal Swab  Result Value Ref Range Status   MRSA, PCR NEGATIVE NEGATIVE Final   Staphylococcus aureus NEGATIVE NEGATIVE Final    Comment: (NOTE) The Xpert SA Assay (FDA approved for NASAL specimens in patients 47 years of age and older), is one component of a comprehensive surveillance program. It is not intended to diagnose infection nor to guide or monitor treatment. Performed at [facility], [address] [place], [place] [address]   Culture, blood (routine x 2)     Status: None (Preliminary result)   Collection Time: 11/11/18  7:55 AM   Specimen: BLOOD  Result Value Ref Range Status   Specimen Description BLOOD LEFT ANTECUBITAL  Final   Special Requests AEROBIC BOTTLE ONLY Blood  Culture adequate volume  Final   Culture   Final    NO GROWTH 2 DAYS Performed at [facility], [address] [place], [place] [address]    Report Status PENDING  Incomplete  Culture, blood (routine x 2)     Status: None (Preliminary result)   Collection Time: 11/11/18  8:03 AM   Specimen: BLOOD  Result Value Ref Range Status   Specimen Description BLOOD RIGHT ANTECUBITAL  Final   Special Requests AEROBIC BOTTLE ONLY Blood Culture adequate volume  Final   Culture   Final    NO GROWTH 2 DAYS Performed at [facility], [address], [place], [place] [address]    Report Status PENDING  Incomplete     Labs: BNP (last 3 results) No results for input(s): BNP in the last 8760 hours. Basic Metabolic Panel: Recent Labs  Lab 11/06/18 1651 11/07/18 0554 11/08/18 0402 11/10/18 0303  NA 141 143 140 138  K 3.7 3.9 4.0 4.2  CL 102 109 106 103  CO2 27 21* 26 28  GLUCOSE 130* 111* 128* 140*  BUN 27* 25* 22* 17  CREATININE 1.11 1.11 1.08 1.18  CALCIUM 9.1 9.0 8.6* 8.6*   Liver Function Tests: Recent Labs  Lab 11/06/18 1651 11/07/18  6045 11/08/18 0402  AST ALT ALKPHOS 35* 29* 31*  BILITOT 0.5 QUANTITY NOT SUFFICIENT, UNABLE TO PERFORM TEST 0.9  PROT 7.7 6.7 6.0*  ALBUMIN 4.4 3.8 3.4*   Recent Labs  Lab 11/06/18 1651  LIPASE 22   No results for input(s): AMMONIA in the last 168 hours. CBC: Recent Labs  Lab 11/06/18 1651 11/07/18 0554 11/07/18 1854 11/08/18 0611 11/10/18 0303  WBC 13.5* 9.2  --  8.7 8.6  NEUTROABS 11.2*  --   --   --   --   HGB 12.7* 13.4  --  11.5* 10.7*  HCT 39.4 43.7 38.1 35.2* 31.2*  MCV 97.8 108.4*  --  98.1 95.7  PLT 261 236  --  207 185   Cardiac Enzymes: Recent Labs  Lab 11/07/18 0554  CKTOTAL 623*  CKMB 3.6   BNP: Invalid input(s): POCBNP CBG: Recent Labs  Lab 11/09/18 0825  GLUCAP 94   D-Dimer No results for input(s): DDIMER in the last 72 hours. Hgb A1c No results for input(s): HGBA1C  in the last 72 hours. Lipid Profile No results for input(s): CHOL, HDL, LDLCALC, TRIG, CHOLHDL, LDLDIRECT in the last 72 hours. Thyroid function studies No results for input(s): TSH, T4TOTAL, T3FREE, THYROIDAB in the last 72 hours.  Invalid input(s): FREET3 Anemia work up No results for input(s): VITAMINB12, FOLATE, FERRITIN, TIBC, IRON, RETICCTPCT in the last 72 hours. Urinalysis    Component Value Date/Time   COLORURINE YELLOW 11/11/2018 0829   APPEARANCEUR CLEAR 11/11/2018 0829   LABSPEC 1.016 11/11/2018 0829   PHURINE 7.0 11/11/2018 0829   GLUCOSEU NEGATIVE 11/11/2018 0829   HGBUR SMALL (A) 11/11/2018 0829   BILIRUBINUR NEGATIVE 11/11/2018 0829   KETONESUR NEGATIVE 11/11/2018 0829   PROTEINUR NEGATIVE 11/11/2018 0829   NITRITE NEGATIVE 11/11/2018 0829   LEUKOCYTESUR NEGATIVE 11/11/2018 0829   Sepsis Labs Invalid input(s): PROCALCITONIN,  WBC,  LACTICIDVEN Microbiology Recent Results (from the past 240 hour(s))  SARS Coronavirus 2 by RT PCR (hospital order, performed in [facility] hospital lab) Nasopharyngeal Nasopharyngeal Swab     Status: None   Collection Time: 11/06/18  6:01 PM   Specimen: Nasopharyngeal Swab  Result Value Ref Range Status   SARS Coronavirus 2 NEGATIVE NEGATIVE Final    Comment: (NOTE) If result is NEGATIVE SARS-CoV-2 target nucleic acids are NOT DETECTED. The SARS-CoV-2 RNA is generally detectable in upper and lower  respiratory specimens during the acute phase of infection. The lowest  concentration of SARS-CoV-2 viral copies this assay can detect is 250  copies / mL. A negative result does not preclude SARS-CoV-2 infection  and should not be used as the sole basis for treatment or other  patient management decisions.  A negative result may occur with  improper specimen collection / handling, submission of specimen other  than nasopharyngeal swab, presence of viral mutation(s) within the  areas targeted by this assay, and inadequate number of  viral copies  (<250 copies / mL). A negative result must be combined with clinical  observations, patient history, and epidemiological information. If result is POSITIVE SARS-CoV-2 target nucleic acids are DETECTED. The SARS-CoV-2 RNA is generally detectable in upper and lower  respiratory specimens dur ing the acute phase of infection.  Positive  results are indicative of active infection with SARS-CoV-2.  Clinical  correlation with patient history and other diagnostic information is  necessary to determine patient infection status.  Positive results do  not rule out bacterial  infection or co-infection with other viruses. If result is PRESUMPTIVE POSTIVE SARS-CoV-2 nucleic acids MAY BE PRESENT.   A presumptive positive result was obtained on the submitted specimen  and confirmed on repeat testing.  While 2019 novel coronavirus  (SARS-CoV-2) nucleic acids may be present in the submitted sample  additional confirmatory testing may be necessary for epidemiological  and / or clinical management purposes  to differentiate between  SARS-CoV-2 and other Sarbecovirus currently known to infect humans.  If clinically indicated additional testing with an alternate test  methodology [phone]) is advised. The SARS-CoV-2 RNA is generally  detectable in upper and lower respiratory sp ecimens during the acute  phase of infection. The expected result is Negative. Fact Sheet for Patients:  BoilerBrush.com.cy Fact Sheet for Healthcare Providers: https://pope.com/ This test is not yet approved or cleared by the [place] FDA and has been authorized for detection and/or diagnosis of SARS-CoV-2 by FDA under an Emergency Use Authorization (EUA).  This EUA will remain in effect (meaning this test can be used) for the duration of the COVID-19 declaration under Section 564(b)(1) of the Act, 21 U.S.C. section 360bbb-3(b)(1), unless the authorization is  terminated or revoked sooner. Performed at [facility], [address] [place], [place] [address]   CSF culture     Status: None   Collection Time: 11/07/18  3:05 AM   Specimen: CSF; Cerebrospinal Fluid  Result Value Ref Range Status   Specimen Description CSF  Final   Special Requests TUBE 2  Final   Gram Stain   Final    CYTOSPIN SMEAR RARE WBC PRESENT, PREDOMINANTLY PMN NO ORGANISMS SEEN    Culture   Final    NO GROWTH 3 DAYS Performed at [facility], [address] [place], [place] [address]    Report Status 11/10/2018 FINAL  Final  Surgical PCR screen     Status: None   Collection Time: 11/09/18  5:34 AM   Specimen: Nasal Mucosa; Nasal Swab  Result Value Ref Range Status   MRSA, PCR NEGATIVE NEGATIVE Final   Staphylococcus aureus NEGATIVE NEGATIVE Final    Comment: (NOTE) The Xpert SA Assay (FDA approved for NASAL specimens in patients 16 years of age and older), is one component of a comprehensive surveillance program. It is not intended to diagnose infection nor to guide or monitor treatment. Performed at [facility], [address] [place], [place] [address]   Culture, blood (routine x 2)     Status: None (Preliminary result)   Collection Time: 11/11/18  7:55 AM   Specimen: BLOOD  Result Value Ref Range Status   Specimen Description BLOOD LEFT ANTECUBITAL  Final   Special Requests AEROBIC BOTTLE ONLY Blood Culture adequate volume  Final   Culture   Final    NO GROWTH 2 DAYS Performed at [facility], [address], [place], [place] [address]    Report Status PENDING  Incomplete  Culture, blood (routine x 2)     Status: None (Preliminary result)   Collection Time: 11/11/18  8:03 AM   Specimen: BLOOD  Result Value Ref Range Status   Specimen Description BLOOD RIGHT ANTECUBITAL  Final   Special Requests AEROBIC BOTTLE ONLY Blood Culture adequate volume  Final   Culture   Final    NO GROWTH 2 DAYS Performed at [facility], [address] [place], [place] [address]    Report Status PENDING  Incomplete  Patient was seen and examined on the day of discharge and was found to be in stable condition. Time coordinating discharge: 25 minutes including assessment and coordination of care, as well as examination of the patient.   SIGNED:  Dessa Phi, DO [facility] 11/13/2018, 10:54 AM

## 2018-11-14 ENCOUNTER — Inpatient Hospital Stay (HOSPITAL_COMMUNITY): Payer: Self-pay | Admitting: Occupational Therapy

## 2018-11-14 ENCOUNTER — Inpatient Hospital Stay (HOSPITAL_COMMUNITY): Payer: Self-pay | Admitting: Physical Therapy

## 2018-11-14 ENCOUNTER — Inpatient Hospital Stay (HOSPITAL_COMMUNITY): Payer: Self-pay

## 2018-11-14 DIAGNOSIS — G834 Cauda equina syndrome: Secondary | ICD-10-CM

## 2018-11-14 DIAGNOSIS — M7989 Other specified soft tissue disorders: Secondary | ICD-10-CM

## 2018-11-14 LAB — CBC WITH DIFFERENTIAL/PLATELET
Abs Immature Granulocytes: 0.05 10*3/uL (ref 0.00–0.07)
Basophils Absolute: 0.1 10*3/uL (ref 0.0–0.1)
Basophils Relative: 1 %
Eosinophils Absolute: 0.3 10*3/uL (ref 0.0–0.5)
Eosinophils Relative: 3 %
HCT: 37.4 % — ABNORMAL LOW (ref 39.0–52.0)
Hemoglobin: 12.3 g/dL — ABNORMAL LOW (ref 13.0–17.0)
Immature Granulocytes: 1 %
Lymphocytes Relative: 17 %
Lymphs Abs: 1.5 10*3/uL (ref 0.7–4.0)
MCH: 31.9 pg (ref 26.0–34.0)
MCHC: 32.9 g/dL (ref 30.0–36.0)
MCV: 97.1 fL (ref 80.0–100.0)
Monocytes Absolute: 0.8 10*3/uL (ref 0.1–1.0)
Monocytes Relative: 8 %
Neutro Abs: 6.4 10*3/uL (ref 1.7–7.7)
Neutrophils Relative %: 70 %
Platelets: 290 10*3/uL (ref 150–400)
RBC: 3.85 MIL/uL — ABNORMAL LOW (ref 4.22–5.81)
RDW: 12 % (ref 11.5–15.5)
WBC: 9 10*3/uL (ref 4.0–10.5)
nRBC: 0 % (ref 0.0–0.2)

## 2018-11-14 LAB — COMPREHENSIVE METABOLIC PANEL
ALT: 40 U/L (ref 0–44)
AST: 30 U/L (ref 15–41)
Albumin: 3.3 g/dL — ABNORMAL LOW (ref 3.5–5.0)
Alkaline Phosphatase: 38 U/L (ref 38–126)
Anion gap: 10 (ref 5–15)
BUN: 19 mg/dL (ref 6–20)
CO2: 30 mmol/L (ref 22–32)
Calcium: 9.2 mg/dL (ref 8.9–10.3)
Chloride: 97 mmol/L — ABNORMAL LOW (ref 98–111)
Creatinine, Ser: 1.12 mg/dL (ref 0.61–1.24)
GFR calc Af Amer: >60 mL/min (ref >60)
GFR calc non Af Amer: >60 mL/min (ref >60)
Glucose, Bld: 138 mg/dL — ABNORMAL HIGH (ref 70–99)
Potassium: 4.1 mmol/L (ref 3.5–5.1)
Sodium: 137 mmol/L (ref 135–145)
Total Bilirubin: 0.4 mg/dL (ref 0.3–1.2)
Total Protein: 7.4 g/dL (ref 6.5–8.1)

## 2018-11-14 MED ORDER — TAMSULOSIN HCL 0.4 MG PO CAPS
0.4000 mg | ORAL_CAPSULE | Freq: Every day | ORAL | Status: DC
  Administered 2018-11-14 – 2018-11-29 (×16): 0.4 mg via ORAL
  Filled 2018-11-14 (×16): qty 1

## 2018-11-14 MED ORDER — GABAPENTIN 100 MG PO CAPS
100.0000 mg | ORAL_CAPSULE | Freq: Every day | ORAL | Status: DC
  Administered 2018-11-14: 100 mg via ORAL
  Filled 2018-11-14: qty 1

## 2018-11-14 NOTE — Progress Notes (Signed)
Occupational Therapy Session Note  Patient Details  Name: Joseph Reeves MRN: [id number] Date of Birth: [birth year]  Today's Date: 11/14/2018 OT Individual Time: 4098-1191 OT Individual Time Calculation (min): 74 min    Skilled Therapeutic Interventions/Progress Updates:    Pt in the bedside recliner during session.  Had him work on bathing from the recliner.  Pt with increased pain in the upper anterior thighs, shooting down with all movements.  He has to move slowly secondary to the pain.  He was able to complete washing his UB with setup.  Min assist for donning pullover shirt and lumbar corset.  He requested the need to toilet, so Charlaine Dalton was utilized for transfer to the [facility] with mod assist for sit to stand pulling on the [place].  He needed max assist for wiping his buttocks while standing in the [place].  Next, he was able to complete transfer back to the recliner with use of the [place].  He completed washing his legs with max assist to bring them up in order to reach his feet.  He reported increased pain when attempting to bring the RLE up to wash so therapist assisted.  Feel he will likely benefit from AE trial to assist with LB selfcare.  Total assist for donning shorts as well as TEDs, and gripper socks.  Finished session with pt in the recliner and call button and phone in reach.    Therapy Documentation Precautions:  Precautions Precautions: Back, Fall Precaution Booklet Issued: No Required Braces or Orthoses: Spinal Brace Spinal Brace: Lumbar corset, Applied in sitting position Restrictions Weight Bearing Restrictions: No  Pain: Pain Assessment Pain Scale: Faces Faces Pain Scale: Hurts little more Pain Type: Surgical pain Pain Location: Back Pain Orientation: Posterior Pain Descriptors / Indicators: Discomfort Pain Onset: With Activity Pain Intervention(s): Repositioned ADL: See Care Tool Section for some details of ADL  Therapy/Group: Individual Therapy  Darric Plante  OTR/L 11/14/2018, 12:17 PM

## 2018-11-14 NOTE — Progress Notes (Signed)
Inpatient Rehabilitation  Patient information reviewed and entered into eRehab system by Beonca Gibb M. Kayleb Warshaw, M.A., CCC/SLP, PPS Coordinator.  Information including medical coding, functional ability and quality indicators will be reviewed and updated through discharge.    

## 2018-11-14 NOTE — Evaluation (Signed)
Physical Therapy Assessment and Plan  Patient Details  Name: Joseph Reeves MRN: [id number] Date of Birth: 08/01/1965  PT Diagnosis: Abnormal posture, Difficulty walking, Impaired sensation, Muscle spasms, Muscle weakness, Paraplegia and Pain in BLE Rehab Potential: Good ELOS: 18-21 days   Today's Date: 11/14/2018 PT Individual Time: 3419-3790 PT Individual Time Calculation (min): 25 min   PT Missed Time: 50 min Missed Time Reason: commode; RN care (I/O cath)  Problem List:  Patient Active Problem List   Diagnosis Date Noted  . Cauda equina syndrome ([place]) 11/13/2018  . Cauda equina syndrome with neurogenic bladder ([place])   . Neurogenic bladder, flaccid   . Neurogenic bowel   . Leg weakness 11/07/2018  . Numbness and tingling of both legs 11/07/2018    Past Medical History:  Past Medical History:  Diagnosis Date  . Sciatica    Past Surgical History:  Past Surgical History:  Procedure Laterality Date  . RADIOLOGY WITH ANESTHESIA N/A 11/07/2018   Procedure: MRI WITH ANESTHESIA;  Surgeon: Radiologist, Medication, MD;  Location: [facility];  Service: Radiology;  Laterality: N/A;    Assessment & Plan Clinical Impression:  Joseph Reeves is a 53 year old right-handed male with history of tobacco abuse(rate cigar)as well as sciatica status post ESI in the remote past and maintained on Valium,mobicas well as oxycodone as needed. Per chart review patient lives with significant other. 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 He has a sister in [place] as well as another sister in [place] with good support. Presented 11/07/2018 with numbness in his buttocks bilaterally as well as legs and feet. The numbness in his right leg and foot are greater than the left leg. He also describes weakness in the lower extremities right greater than left. Patient also noticed some difficulty with urination. CT/MRI of the  brain unremarkable. Sedimentation rate within normal limits at 15, SARS coronavirus negative, urine drug screen positive benzos. LP completed CSF inconclusive due to bloody tap. 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 Underwent L2-3 lumbar decompression including L2 and L3 laminectomy, L2-3 discectomy, 2-3 foraminotomy and decompression of central canal 11/09/2018 per Dr. Sherwood Gambler. Hospital course pain management. Acute blood loss anemia 10.7. Patient with low-grade fever 101 urine study negativeand chest x-ray showed no active disease. Blood cultures no growth to date. Patient with bouts of urinary retention with residuals greater than 900 and a Foley tube was placed. Therapy evaluations completed back brace when out of bed. Patient to beadmitted for a comprehensive rehab program. Patient transferred to CIR on 11/13/2018 .   Patient currently requires total with mobility secondary to muscle weakness and muscle joint tightness, abnormal tone and unbalanced muscle activation and decreased sitting balance, decreased standing balance, decreased postural control and decreased balance strategies.  Prior to hospitalization, patient was independent  with mobility and lived with Spouse in a House home.  Home access is 3Stairs to enter.  Patient will benefit from skilled PT intervention to maximize safe functional mobility, minimize fall risk and decrease caregiver burden for planned discharge home with intermittent assist.  Anticipate patient will benefit from follow up [facility] at discharge.  PT - End of Session Activity Tolerance: Tolerates 30+ min activity with multiple rests Endurance Deficit: Yes Endurance Deficit Description: frequent rest breaks PT Assessment Rehab Potential (ACUTE/IP ONLY): Good PT Barriers  to Discharge: Decreased  caregiver support;Medical stability;Home environment access/layout;Neurogenic Bowel & Bladder PT Patient demonstrates impairments in the following area(s): Balance;Endurance;Motor;Pain;Safety;Sensory PT Transfers Functional Problem(s): Bed Mobility;Bed to Chair;Car;Furniture;Floor PT Locomotion Functional Problem(s): Ambulation;Wheelchair Mobility;Stairs PT Plan PT Intensity: Minimum of 1-2 x/day ,45 to 90 minutes PT Frequency: 5 out of 7 days PT Duration Estimated Length of Stay: 18-21 days PT Treatment/Interventions: Ambulation/gait training;Balance/vestibular training;Community reintegration;Discharge planning;DME/adaptive equipment instruction;Functional electrical stimulation;Functional mobility training;Neuromuscular re-education;Pain management;Patient/family education;Psychosocial support;Splinting/orthotics;Stair training;Therapeutic Activities;Therapeutic Exercise;UE/LE Strength taining/ROM;UE/LE Coordination activities;Wheelchair propulsion/positioning PT Transfers Anticipated Outcome(s): Supervision PT Locomotion Anticipated Outcome(s): mod I with manual w/c PT Recommendation Recommendations for Other Services: Neuropsych consult;Therapeutic Recreation consult Therapeutic Recreation Interventions: Stress management Follow Up Recommendations: Home health PT Patient destination: Home Equipment Recommended: Wheelchair (measurements);Wheelchair cushion (measurements) Equipment Details: TBD pending progress  Skilled Therapeutic Intervention Attempted to see patient at scheduled 1300 time slot, pt unavailable as he is on commode attempting to have a BM. Returned at 1430 and pt agreeable to attempt therapy evaluation. Pt reports no pain at rest. Pt is mod A for sit to stand to stedy. Stedy transfer recliner to bed. Sit to supine mod A for BLE management. Pt has onset of significant B hip pain once in supine. RN also present for I/O cath. Pt missed 50 min of scheduled skilled therapy  evaluation 2/2 being on commode, RN care, and pain. PN able to provide pain medication for hip pain. Will continue per POC.  PT Evaluation Precautions/Restrictions Precautions Precautions: Back;Fall Precaution Booklet Issued: No Required Braces or Orthoses: Spinal Brace Spinal Brace: Lumbar corset;Applied in sitting position Restrictions Weight Bearing Restrictions: No General PT Amount of Missed Time (min): 50 Minutes PT Missed Treatment Reason: Nursing care;Unavailable (Comment)(on commode)   Home Living/Prior Functioning Home Living Available Help at Discharge: Family;Available 24 hours/day Type of Home: House Home Access: Stairs to enter [employer] of Steps: 3 Entrance Stairs-Rails: None Home Layout: Multi-level;Bed/bath upstairs;Able to live on main level with bedroom/bathroom Additional Comments: plan to stay w/sister upon d/c with level entry to ranch home  Lives With: Spouse Prior Function Level of Independence: Independent with gait;Independent with transfers  Able to Take Stairs?: Yes Driving: Yes Vocation: Full time employment Comments: is from [place], was here for a class for work, likes to fish, ride his bike and motorcycle ("anything outside") Vision/Perception  Perception Perception: Within Functional Limits Praxis Praxis: Intact  Cognition Overall Cognitive Status: Within Functional Limits for tasks assessed Arousal/Alertness: Awake/alert Orientation Level: Oriented X4 Attention: Focused Focused Attention: Appears intact Memory: Appears intact Awareness: Appears intact Problem Solving: Appears intact Safety/Judgment: Appears intact Sensation Sensation Light Touch: Impaired Detail Light Touch Impaired Details: Impaired RLE;Impaired LLE(distal>proximal; R>L) Proprioception: Impaired Detail Proprioception Impaired Details: Impaired RLE;Impaired LLE(R>L) Coordination Gross Motor Movements are Fluid and Coordinated: No Fine Motor Movements are  Fluid and Coordinated: Yes Coordination and Movement Description: impaired 2/2 cauda equina Motor  Motor Motor: Paraplegia;Abnormal tone;Abnormal postural alignment and control Motor - Skilled Clinical Observations: impaired 2/2 cauda equina  Mobility Bed Mobility Bed Mobility: Rolling Right;Rolling Left;Supine to Sit;Sit to Supine Rolling Right: Maximal Assistance - Patient 25-49% Rolling Left: Maximal Assistance - Patient 25-49% Supine to Sit: Moderate Assistance - Patient 50-74% Sit to Supine: Moderate Assistance - Patient 50-74% Transfers Transfers: Sit to [employer] via Geophysicist/field seismologist Sit to Stand: Moderate Assistance - Patient 50-74% Transfer via Lift Equipment: Haematologist / Additional Locomotion Stairs: No Architect: No  Trunk/Postural Assessment  Cervical Assessment Cervical Assessment: Within Psychologist, forensic  Assessment Thoracic Assessment: Within Functional Limits Lumbar Assessment Lumbar Assessment: Exceptions to WFL(lumbar corset; back precautions) Postural Control Postural Control: Deficits on evaluation Postural Limitations: delayed  Balance Balance Balance Assessed: Yes Static Sitting Balance Static Sitting - Balance Support: Bilateral upper extremity supported;Feet supported Static Sitting - Level of Assistance: 5: Stand by assistance Dynamic Sitting Balance Dynamic Sitting - Balance Support: No upper extremity supported;During functional activity;Feet supported Dynamic Sitting - Level of Assistance: 4: Min assist Static Standing Balance Static Standing - Balance Support: Bilateral upper extremity supported;During functional activity Static Standing - Level of Assistance: 4: Min assist;3: Mod assist Extremity Assessment   RLE Assessment RLE Assessment: Exceptions to WFL Passive Range of Motion (PROM) Comments: tight HS General Strength Comments: impaired, see below RLE Strength Right Hip  Flexion: 2/5 Right Knee Flexion: 3/5 Right Knee Extension: 3/5 Right Ankle Dorsiflexion: 1/5 LLE Assessment LLE Assessment: Exceptions to WFL Passive Range of Motion (PROM) Comments: tight HS General Strength Comments: impaired, see below LLE Strength Left Hip Flexion: 2+/5 Left Knee Flexion: 4-/5 Left Knee Extension: 4-/5 Left Ankle Dorsiflexion: 3/5    Refer to Care Plan for Long Term Goals  Recommendations for other services: Neuropsych and Therapeutic Recreation  Stress management  Discharge Criteria: Patient will be discharged from PT if patient refuses treatment 3 consecutive times without medical reason, if treatment goals not met, if there is a change in medical status, if patient makes no progress towards goals or if patient is discharged from hospital.  The above assessment, treatment plan, treatment alternatives and goals were discussed and mutually agreed upon: by patient and by family    , PT, DPT 11/14/2018, 3:30 PM  

## 2018-11-14 NOTE — Progress Notes (Signed)
This nurse was informed of positive  DVT in bilateral posterior tibial vein during shift. This nurse informed on call and received verbal orders to put on bedrest. On call Danella Sensing informed this nurse she would aslo call back with more order regarding dvt. Oncoming nurse informed of situation. Adria Devon, LPN

## 2018-11-14 NOTE — Plan of Care (Signed)
  Problem: Consults Goal: RH GENERAL PATIENT EDUCATION Description: See Patient Education module for education specifics. Outcome: Progressing Goal: Skin Care Protocol Initiated - if Braden Score 18 or less Description: If consults are not indicated, leave blank or document N/A Outcome: Progressing   Problem: RH BOWEL ELIMINATION Goal: RH STG MANAGE BOWEL WITH ASSISTANCE Description: STG Manage Bowel with mod I Assistance. Outcome: Progressing Goal: RH STG MANAGE BOWEL W/MEDICATION W/ASSISTANCE Description: STG Manage Bowel with Medication with mod I Assistance. Outcome: Progressing   Problem: RH BLADDER ELIMINATION Goal: RH STG MANAGE BLADDER WITH ASSISTANCE Description: STG Manage Bladder With min Assistance Outcome: Progressing Goal: RH STG MANAGE BLADDER WITH EQUIPMENT WITH ASSISTANCE Description: STG Manage Bladder With Equipment With min Assistance Outcome: Progressing   Problem: RH SAFETY Goal: RH STG ADHERE TO SAFETY PRECAUTIONS W/ASSISTANCE/DEVICE Description: STG Adhere to Safety Precautions With supervision Assistance/Device. Outcome: Progressing   Problem: RH PAIN MANAGEMENT Goal: RH STG PAIN MANAGED AT OR BELOW PT'S PAIN GOAL Description: Less than 4 on 0-10 scale Outcome: Progressing   Problem: RH KNOWLEDGE DEFICIT GENERAL Goal: RH STG INCREASE KNOWLEDGE OF SELF CARE AFTER HOSPITALIZATION Description: Pt will identify safety and spinal precautions along with medication compliance to manage bowel function prior to DC  Outcome: Progressing

## 2018-11-14 NOTE — Progress Notes (Signed)
Spole with Joseph Heinrich, PA r/t fo;owup treatment for DVT,states she had spoken with Dr, Naaman Plummer and to continue to monitor until f/u in the morning,Made aware of continue pain to back and left hip area, medicate patient per orders

## 2018-11-14 NOTE — Evaluation (Signed)
Occupational Therapy Assessment and Plan  Patient Details  Name: Joseph Reeves MRN: [id number] Date of Birth: 04/09/1965  OT Diagnosis: abnormal posture, acute pain, muscle weakness (generalized) and paraplegia, muscle spasms Rehab Potential: Rehab Potential (ACUTE ONLY): Good ELOS: 18-21 days   Today's Date: 11/14/2018 OT Individual Time: 0700-0800 OT Individual Time Calculation (min): 60 min     Problem List:  Patient Active Problem List   Diagnosis Date Noted  . Cauda equina syndrome ([place]) 11/13/2018  . Cauda equina syndrome with neurogenic bladder ([place])   . Neurogenic bladder, flaccid   . Neurogenic bowel   . Leg weakness 11/07/2018  . Numbness and tingling of both legs 11/07/2018    Past Medical History:  Past Medical History:  Diagnosis Date  . Sciatica    Past Surgical History:  Past Surgical History:  Procedure Laterality Date  . RADIOLOGY WITH ANESTHESIA N/A 11/07/2018   Procedure: MRI WITH ANESTHESIA;  Surgeon: Radiologist, Medication, MD;  Location: [facility];  Service: Radiology;  Laterality: N/A;    Assessment & Plan Clinical Impression: Patient is a 53 y.o. year old male withhistory of tobacco abuse(rate cigar)as well as sciatica status post ESI in the remote past and maintained on Valium,mobicas well as oxycodone as needed. Per chart review patient lives with significant other. 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 He has a sister in [place] as well as another sister in [place] with good support. Presented 11/07/2018 with numbness in his buttocks bilaterally as well as legs and feet. The numbness in his right leg and foot are greater than the left leg. He also describes weakness in the lower extremities right greater than left. Patient also noticed some difficulty with urination. CT/MRI of the brain unremarkable. Sedimentation rate within normal limits at 15, SARS coronavirus  negative, urine drug screen positive benzos. LP completed CSF inconclusive due to bloody tap. 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 Underwent L2-3 lumbar decompression including L2 and L3 laminectomy, L2-3 discectomy, 2-3 foraminotomy and decompression of central canal 11/09/2018 per Dr. Sherwood Gambler. Hospital course pain management. Acute blood loss anemia 10.7. Patient with low-grade fever 101 urine study negativeand chest x-ray showed no active disease. Blood cultures no growth to date. Patient with bouts of urinary retention with residuals greater than 900 and a Foley tube was placed. Therapy evaluations completed back brace when out of bed. Patient to beadmitted for a comprehensive rehab program.  Pt reports no significant pain, but has soreness from no control of lower body- occ muscle spasm at T12 area/groin B/L. Currently on bedpan- thinks he can go; hasn't had a BM in 8+ days. Has had some return of movement in LEs- initially had NO movement. .  Patient transferred to CIR on 11/13/2018 .    Patient currently requires mod - +2 assist with basic self-care skills secondary to muscle weakness and muscle paralysis, decreased coordination and decreased sitting balance, decreased standing balance and decreased balance strategies.  Prior to hospitalization, patient could complete ADLs and IADLs with independent .  Patient will benefit from skilled intervention to decrease level of assist with basic self-care skills prior to discharge home with care partner.  Anticipate patient will require 24 hour supervision and minimal physical assistance and follow up home health.  OT - End of Session Activity Tolerance:  Decreased this session Endurance Deficit: Yes Endurance Deficit Description: frequent rest breaks OT Assessment Rehab  Potential (ACUTE ONLY): Good OT Barriers to Discharge: Home environment access/layout OT Barriers to Discharge Comments: lives in [place] and all bedrooms are on second floor OT Patient demonstrates impairments in the following area(s): Balance;Endurance;Motor;Pain;Safety OT Basic ADL's Functional Problem(s): Grooming;Bathing;Dressing;Toileting OT Transfers Functional Problem(s): Toilet OT Additional Impairment(s): None OT Plan OT Intensity: Minimum of 1-2 x/day, 45 to 90 minutes OT Frequency: 5 out of 7 days OT Duration/Estimated Length of Stay: 18-21 days OT Treatment/Interventions: Balance/vestibular training;DME/adaptive equipment instruction;Patient/family education;Therapeutic Activities;Wheelchair propulsion/positioning;Psychosocial support;Therapeutic Exercise;Community reintegration;Functional mobility training;Self Care/advanced ADL retraining;UE/LE Strength taining/ROM;Discharge planning;Skin care/wound managment;UE/LE Coordination activities;Pain management OT Self Feeding Anticipated Outcome(s): n/a OT Basic Self-Care Anticipated Outcome(s): S - mod I OT Toileting Anticipated Outcome(s): S OT Bathroom Transfers Anticipated Outcome(s): S OT Recommendation Recommendations for Other Services: Neuropsych consult Patient destination: Home Follow Up Recommendations: 24 hour supervision/assistance;Home health OT Equipment Recommended: To be determined   Skilled Therapeutic Intervention Upon entering the room, pt supine in bed with no c/o pain. OT reviewed back precautions and pt performed log roll with mod A sit >supine. Pt with increased pain and spasms with mobility. Pt required total A to don lumbar corset while seated EOB. Pt reports this support helps to decrease pain. Medication given prior to OT arrival. Pt requesting to attempt to have BM this session and "feels like I can go". Stedy utilized with pt standing with mod A initially and transferred onto drop arm commode chair. Pt  requesting to remain on commode chair at end of session with NT notified. OT educated pt on OT purpose, POC, and goals with pt verbalizing understanding.    OT Evaluation Precautions/Restrictions  Precautions Precautions: Back;Fall Required Braces or Orthoses: Spinal Brace Spinal Brace: Lumbar corset;Applied in sitting position Restrictions Weight Bearing Restrictions: No General PT Missed Treatment Reason: Nursing care;Unavailable (Comment)(on commode) Vital Signs Therapy Vitals Temp: 98.8 F (37.1 C) Temp Source: Oral Pulse Rate: 82 Resp: 18 BP: (!) 144/65 Patient Position (if appropriate): Lying Oxygen Therapy SpO2: 100 % O2 Device: Room Air Home Living/Prior [place] expects to be discharged to:: Private residence Living Arrangements: Spouse/significant other Available Help at Discharge: Family, Available 24 hours/day Type of Home: House Home Access: Stairs to enter Technical brewer of Steps: 3 Entrance Stairs-Rails: None Home Layout: Multi-level, Bed/bath upstairs, Able to live on main level with bedroom/bathroom Alternate Level Stairs-Number of Steps: flight Alternate Level Stairs-Rails: Right Bathroom Shower/Tub: Walk-in shower(on second floor) Bathroom Toilet: Standard Bathroom Accessibility: Yes Additional Comments: plan to stay w/sister upon d/c with level entry to ranch home  Lives With: Spouse IADL History Occupation: Full time employment Prior Function Level of Independence: Independent with gait, Independent with transfers  Able to Take Stairs?: Yes Driving: Yes Vocation: Full time employment Comments: is from [place], was here for a class for work, likes to fish, ride his bike and motorcycle ("anything outside") Perception  Perception: Within Functional Limits Praxis Praxis: Intact Cognition Overall Cognitive Status: Within Functional Limits for tasks assessed Arousal/Alertness: Awake/alert Memory: Appears  intact Attention: Focused Focused Attention: Appears intact Awareness: Appears intact Problem Solving: Appears intact Safety/Judgment: Appears intact Sensation Sensation Light Touch: Impaired Detail Light Touch Impaired Details: Impaired RLE;Impaired LLE Proprioception: Impaired Detail Proprioception Impaired Details: Impaired RLE;Impaired LLE Coordination Gross Motor Movements are Fluid and Coordinated: No Fine Motor Movements are Fluid and Coordinated: Yes Coordination and Movement Description: impaired 2/2 cauda equina Motor  Motor Motor: Paraplegia;Abnormal tone;Abnormal  postural alignment and control Motor - Skilled Clinical Observations: impaired 2/2 cauda equina Mobility  Bed Mobility Bed Mobility: Rolling Right;Rolling Left;Supine to Sit;Sit to Supine Rolling Right: Maximal Assistance - Patient 25-49% Rolling Left: Maximal Assistance - Patient 25-49% Supine to Sit: Moderate Assistance - Patient 50-74% Sit to Supine: Moderate Assistance - Patient 50-74% Transfers Sit to Stand: Moderate Assistance - Patient 50-74%  Trunk/Postural Assessment  Cervical Assessment Cervical Assessment: Within Functional Limits Thoracic Assessment Thoracic Assessment: Within Functional Limits Lumbar Assessment Lumbar Assessment: Exceptions to WFL(back precautions, lumbar corset) Postural Control Postural Control: Deficits on evaluation Postural Limitations: delayed  Balance Balance Balance Assessed: Yes Static Sitting Balance Static Sitting - Balance Support: Bilateral upper extremity supported;Feet supported Static Sitting - Level of Assistance: 5: Stand by assistance Dynamic Sitting Balance Dynamic Sitting - Balance Support: No upper extremity supported;During functional activity;Feet supported Dynamic Sitting - Level of Assistance: 4: Min Insurance risk surveyor Standing - Balance Support: Bilateral upper extremity supported;During functional activity Static  Standing - Level of Assistance: 4: Min assist;3: Mod assist Extremity/Trunk Assessment RUE Assessment RUE Assessment: Within Functional Limits LUE Assessment LUE Assessment: Within Functional Limits     Refer to Care Plan for Long Term Goals  Recommendations for other services: Neuropsych   Discharge Criteria: Patient will be discharged from OT if patient refuses treatment 3 consecutive times without medical reason, if treatment goals not met, if there is a change in medical status, if patient makes no progress towards goals or if patient is discharged from hospital.  The above assessment, treatment plan, treatment alternatives and goals were discussed and mutually agreed upon: by patient  Gypsy Decant 11/14/2018, 5:03 PM

## 2018-11-14 NOTE — Progress Notes (Signed)
Received a call from [place] LPN regarding Vascular Dopplers:  Summary: Right: Findings consistent with acute deep vein thrombosis involving the right posterior tibial veins, and right peroneal veins. Left: Findings consistent with acute deep vein thrombosis involving the left posterior tibial veins, and left peroneal veins.  We will continue to monitor, repeat dopplers in a week, with Dr. Dagoberto Ligas approval. The above was discussed with Dr Naaman Plummer.  Placed a call and spoke with [facility] RN regarding the above.  Regarding Mr. Aubry complaints of lumbar radicular pain, gabapentin was prescribed, we will continue to monitor and titrate slowly.

## 2018-11-14 NOTE — Progress Notes (Signed)
Patient noted having pain rated 10/10 around 3:00 this afternoon, this nurse medicated patient as ordered. Patient called nurse back an hour later explains pain to be radiating in bilateral legs from the groining area. This nurse offered to reposition patient to assist  with pain and he refused. This nurse contacted  Danella Sensing, NP of situation and order received for Gabapentin 100mg  at bedtime. At that time the patients mother came to the desk stating he was crying because of his pain level. I entered the order I received from [place] and went into the room to give patient prn med's that were available. Patient continues to complain of pain during walking rounds.  Adria Devon, LPN.

## 2018-11-14 NOTE — H&P (Signed)
Physical Medicine and Rehabilitation Admission H&P       Chief Complaint  Patient presents with  . Back Pain  : HPI: Joseph Reeves is a 53 year old right-handed male with history of tobacco abuse (rate cigar) as well as sciatica status post ESI in the remote past and maintained on Valium, mobic as well as oxycodone as needed.  Per chart review patient lives with significant other.  Patient is from [place] and was here in the area for a class pertaining to his job.  He has a sister in [place] as well as another sister in [place] with good support.  Presented 11/07/2018 with numbness in his buttocks bilaterally as well as legs and feet.  The numbness in his right leg and foot are greater than the left leg.  He also describes weakness in the lower extremities right greater than left.  Patient also noticed some difficulty with urination.  CT/MRI of the brain unremarkable.  Sedimentation rate within normal limits at 15, SARS coronavirus negative, urine drug screen positive benzos.  LP completed CSF inconclusive due to bloody tap.  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  Underwent L2-3 lumbar decompression including L2 and L3 laminectomy, L2-3 discectomy, 2-3 foraminotomy and decompression of central canal 11/09/2018 per Dr. Newell CoralNudelman.  Hospital course pain management.  Acute blood loss anemia 10.7.  Patient with low-grade fever 101 urine study negative and chest x-ray showed no active disease.  Blood cultures no growth to date.  Patient with bouts of urinary retention with residuals greater than 900 and a Foley tube was placed.  Therapy evaluations completed back brace when out of bed.  Patient to be admitted for a comprehensive rehab program.  Pt reports no significant pain, but has soreness from no  control of lower body- occ muscle spasm at T12 area/groin B/L. Currently on bedpan- thinks he can go; hasn't had a BM in 8+ days. Has had some return of movement in LEs- initially had NO movement.  Review of Systems  Constitutional: Positive for fever. Negative for chills.  HENT: Negative for hearing loss.   Eyes: Negative for blurred vision and double vision.  Respiratory: Negative for cough and shortness of breath.   Cardiovascular: Negative for chest pain, palpitations and leg swelling.  Gastrointestinal: Positive for constipation. Negative for heartburn, nausea and vomiting.  Genitourinary: Negative for dysuria, flank pain and hematuria.       Urinary retention  Musculoskeletal: Positive for back pain and myalgias.  Skin: Negative for rash.  Neurological: Positive for sensory change and weakness.  All other systems reviewed and are negative.  Past Medical History:  Diagnosis Date  . Sciatica         Past Surgical History:  Procedure Laterality Date  . RADIOLOGY WITH ANESTHESIA N/A 11/07/2018   Procedure: MRI WITH ANESTHESIA;  Surgeon: Radiologist, Medication, MD;  Location: [facility] OR;  Service: Radiology;  Laterality: N/A;        Family History  Problem Relation Age of Onset  . Hypertension Father   . Heart failure Father    Social History:  reports that he has been smoking cigars. He has never used smokeless tobacco. He reports current alcohol use. He reports that he does not use drugs. Allergies: No Known Allergies       Medications Prior to Admission  Medication Sig Dispense Refill  .  diazepam (VALIUM) 5 MG tablet Take 1 tablet (5 mg total) by mouth every 6 (six) hours as needed for anxiety (spasms). 10 tablet 0  . meloxicam (MOBIC) 15 MG tablet Take 1 tablet daily for back pain. 15 tablet 0  . oxyCODONE-acetaminophen (PERCOCET) 5-325 MG tablet Take 1 tablet by mouth every 4 (four) hours as needed for severe pain. 20 tablet 0  . predniSONE (DELTASONE) 20 MG tablet  Take 60 mg daily x 2 days then 40 mg daily x 2 days then 20 mg daily x 2 days (Patient taking differently: Take 20-60 mg by mouth See admin instructions. Take 3 tablets for 2 days then take 2 tablets for 2 days then take 1 tablet for 2 days) 12 tablet 0    Drug Regimen Review Drug regimen was reviewed and remains appropriate with no significant issues identified  Home: Home Living Family/patient expects to be discharged to:: Private residence Living Arrangements: Spouse/significant other Available Help at Discharge: Family Type of Home: House Home Access: Stairs to enter Technical brewer of Steps: 2 Entrance Stairs-Rails: None Home Layout: Multi-level Alternate Level Stairs-Number of Steps: flight Alternate Level Stairs-Rails: Right Home Equipment: Cane - single point  Was a [employer] driver, 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 Home in [place] is @ [address] with 0-1 STE; house in [place] has 3 STE.  Functional History: Prior Function Level of Independence: Independent Comments: is from [place], was here for a class for work, likes to fish, ride his bike and motorcycle ("anything outside")  Functional Status:  Mobility: Bed Mobility Overal bed mobility: Needs Assistance Bed Mobility: Rolling, Sidelying to Sit Rolling: Mod assist Sidelying to sit: Mod assist Supine to sit: Min guard General bed mobility comments: needed mod A for bridging of knees and maintaining that position. Use of rail for rolling and mod A for LE's off bed. Mod HHA for elevation of trunk into sitting. Pt with increase in pain as soon as wt onto hips Transfers Overall transfer level: Needs assistance Equipment used: Ambulation equipment used Transfer via Lift Equipment: Stedy Transfers: Sit to/from Stand Sit to Stand: From elevated surface, Min assist, Mod assist General transfer comment: pt needed mod A for sit<>stand from bed, min A from elevated surface of stedy. Practiced  sit<>stand 3x within stedy, heavy use of UE's Ambulation/Gait General Gait Details: B knee buckling, unable to pick up feet in [place] to take steps. Worked on pregait wt shifting in standing in stedy  ADL: ADL Overall ADL's : Needs assistance/impaired Eating/Feeding: Independent Grooming: Wash/dry hands, Wash/dry face, Oral care, Set up, Sitting Upper Body Bathing: Set up, Sitting Lower Body Bathing: Moderate assistance, Sitting/lateral leans Upper Body Dressing : Minimal assistance, Sitting Lower Body Dressing: Maximal assistance, Total assistance Toilet Transfer Details (indicate cue type and reason): stedy lift  Cognition: Cognition Overall Cognitive Status: Within Functional Limits for tasks assessed Orientation Level: Oriented X4 Cognition Arousal/Alertness: Awake/alert Behavior During Therapy: WFL for tasks assessed/performed Overall Cognitive Status: Within Functional Limits for tasks assessed  Physical Exam: Blood pressure 121/69, pulse 79, temperature 99 F (37.2 C), temperature source Oral, resp. rate 18, height 6\' 1"  (1.854 m), weight 107.7 kg, SpO2 99 %. Physical Exam  Nursing note and vitals reviewed. Constitutional: He is oriented to person, place, and time. He appears well-developed and well-nourished.  Mother at side; lying supine in bed; good fruit plate delivered while I was there, calmer while I was there, NAD  HENT:  Head: Normocephalic and  atraumatic.  Nose: Nose normal.  Mouth/Throat: No oropharyngeal exudate.  Eyes: Pupils are equal, round, and reactive to light. Conjunctivae and EOM are normal. No scleral icterus.  Neck: Normal range of motion. Neck supple. No tracheal deviation present. No thyromegaly present.  Cardiovascular: Exam reveals no gallop and no friction rub.  No murmur heard. RRR good heart sounds  Respiratory: Effort normal and breath sounds normal.  CTA B/L with no wheezes, rales or rhonchi heard  GI:  Hyperactive BS- trying to have  BM (on bedpan) abd slightly distended; NT, firmer, not hard No rebound  Genitourinary:    Genitourinary Comments: Has foley- light amber urine   Musculoskeletal:        General: No tenderness or edema.     Comments: UEs 5/5 in deloitds, biceps, WE, triceps, grip and finger abd B/L  LEs- RLE HF 2/5, KE 2/5, DF 2/5, PF 1/5, EHL 0/5 LLE HF 2/5 (slightly stronger than R), KE 3/5, DF 3/5, PF 2/5, EHL 2/5   Neurological: He is alert and oriented to person, place, and time. No cranial nerve deficit.  Patient is alert .  Follows commands.  Oriented x3. Decreased sensation ONLY at R S1 on exam- tested dermatomes on arms, torso and legs- otherwise intact everywhere else. Flaccid partial paralysis  Skin:  Back incision is dressed Unable to assess since on bedpan  Psychiatric:  Calm, full affect, Ox3; appropriate    Lab Results Last 48 Hours        Results for orders placed or performed during the hospital encounter of 11/06/18 (from the past 48 hour(s))  Culture, blood (routine x 2)     Status: None (Preliminary result)   Collection Time: 11/11/18  7:55 AM   Specimen: BLOOD  Result Value Ref Range   Specimen Description BLOOD LEFT ANTECUBITAL    Special Requests AEROBIC BOTTLE ONLY Blood Culture adequate volume    Culture      NO GROWTH 1 DAY Performed at [facility], [address], [place], [place] [address]    Report Status PENDING   Culture, blood (routine x 2)     Status: None (Preliminary result)   Collection Time: 11/11/18  8:03 AM   Specimen: BLOOD  Result Value Ref Range   Specimen Description BLOOD RIGHT ANTECUBITAL    Special Requests AEROBIC BOTTLE ONLY Blood Culture adequate volume    Culture      NO GROWTH 1 DAY Performed at [facility], [address] [place], [place] [address]    Report Status PENDING   Urinalysis, Routine w reflex microscopic     Status: Abnormal   Collection Time: 11/11/18  8:29 AM  Result Value Ref  Range   Color, Urine YELLOW YELLOW   APPearance CLEAR CLEAR   Specific Gravity, Urine 1.016 1.005 - 1.030   pH 7.0 5.0 - 8.0   Glucose, UA NEGATIVE NEGATIVE mg/dL   Hgb urine dipstick SMALL (A) NEGATIVE   Bilirubin Urine NEGATIVE NEGATIVE   Ketones, ur NEGATIVE NEGATIVE mg/dL   Protein, ur NEGATIVE NEGATIVE mg/dL   Nitrite NEGATIVE NEGATIVE   Leukocytes,Ua NEGATIVE NEGATIVE   RBC / HPF 0-5 0 - 5 RBC/hpf   WBC, UA 0-5 0 - 5 WBC/hpf   Bacteria, UA NONE SEEN NONE SEEN   Mucus PRESENT     Comment: Performed at [facility], [address], [place], [place] [address]      Imaging Results (Last 48 hours)  Dg  Chest Port 1 View  Result Date: 11/11/2018 CLINICAL DATA:  Fever EXAM: PORTABLE CHEST 1 VIEW COMPARISON:  11/06/2018 FINDINGS: The heart size and mediastinal contours are within normal limits. Both lungs are clear. The visualized skeletal structures are unremarkable. IMPRESSION: No acute abnormality of the lungs in AP portable projection. Electronically Signed   By: Lauralyn Primes M.D.   On: 11/11/2018 13:08        Medical Problem List and Plan: 1.  Numbness, tingling, weakness of bilateral lower extremities secondary to L2-L3 disc herniation; L1 ASIA C incomplete paraplegia due to  cauda equina syndrome. 2.  Antithrombotics: -DVT/anticoagulation: SCDs.  Check vascular study             -antiplatelet therapy: N/A 3. Pain Management: Hydrocodone as needed 4. Mood: Provide emotional support             -antipsychotic agents: N/A 5. Neuropsych: This patient is capable of making decisions on his own behalf. 6. Skin/Wound Care: Routine skin checks 7. Fluids/Electrolytes/Nutrition: Routine in and outs with follow-up chemistries 8.  Neurogenic bowel and bladder.  Need to discuss removing Foley tube and begin voiding trial- however since he had >1L of volume in bladder and bladder has been stretched, Urology usually asks [place] to wait ~ 1 week of bladder rest to  remove foley- will discuss starting Flomax to maximize potential of pt voiding after foley removed. - pt likely has neurogenic bowel and is during spinal shock- gut doesn't work during spinal shock and needs laxatives daily, as well as suppository nightly as part of bowel program- if pt unable to go on his own today, will put in for Suppository (enemas usually don't work in cauda equina syndrome patients) for this evening. 9.  Postoperative anemia.  Follow-up CBC 10. SCI education- will educate pt on SCI issues during inpt rehab. Will likely need Rehab physician in [place], [place] and in [place] in future.     Mcarthur Rossetti Angiulli, PA-C 11/13/2018

## 2018-11-14 NOTE — Patient Care Conference (Signed)
Inpatient RehabilitationTeam Conference and Plan of Care Update Date: 11/15/2018   Time: 11:20 AM    Patient Name: Joseph Reeves      Medical Record Number: [id number]  Date of Birth: Mar 07, 1965 Sex: Male         Room/Bed: 4M08C/4M08C-01 Payor Info: Payor: MEDICAID POTENTIAL / Plan: MEDICAID POTENTIAL / Product Type: *No Product type* /    Admit Date/Time:  11/13/2018  7:26 PM  Primary Diagnosis:  Cauda equina syndrome [facility])  Patient Active Problem List   Diagnosis Date Noted  . Cauda equina syndrome ([place]) 11/13/2018  . Cauda equina syndrome with neurogenic bladder ([place])   . Neurogenic bladder, flaccid   . Neurogenic bowel   . Leg weakness 11/07/2018  . Numbness and tingling of both legs 11/07/2018    Expected Discharge Date:    Team Members Present: Social Worker Present: Lennart Pall, LCSW Nurse Present: Ellison Carwin, LPN PT Present: Excell Seltzer, PT OT Present: Amy Rounds, OT SLP Present: Jettie Booze, CF-SLP Other (Discipline and Name): Case Manager:  Karene Fry, RN PPS Coordinator present : Gunnar Fusi, Novella Olive, PT     Current Status/Progress Goal Weekly Team Focus  Bowel/Bladder   Continent of Bowel. LBM 10/26. 16 fr foley in place acute urinary retention  remove foley and be able to empty bladder by disharge  Continue to monitor and assess q shift   Swallow/Nutrition/ Hydration             ADL's   Eval Pending         Mobility   eval pending  eval pending  eval pending   Communication             Safety/Cognition/ Behavioral Observations            Pain   Patient c/o pain back pain, groin, right leg 8/10  2/10  Continue to assess and monitor q shift; Give pain medications as ordered and assess   Skin   Incision (s/p laminectomy) on back; skin glue over area  remain free of infection and skin break down  assess and monitor q shift    Rehab Goals Patient on target to meet rehab goals: Yes *See Care Plan and progress notes for long  and short-term goals.     Barriers to Discharge  Current Status/Progress Possible Resolutions Date Resolved   Nursing                  PT  Decreased caregiver support;Medical stability;Home environment access/layout;Neurogenic Bowel & Bladder                 OT Home environment access/layout  lives in [place] and all bedrooms are on second floor             SLP                SW                Discharge Planning/Teaching Needs:  Pt to d/c initially to sister's home in [place] then return to his home in [place].  Teaching needs TBD and planned ed closer to d/c.   Team Discussion: Cauda equina L1Asia C, incontinent, neurogenic bladder, to start flomax, foley DC yesterday, no pain, BM yesterday, will need I&O cath q4hours.  Pain is controlled, skin fine.  OT eval pending, PT eval this pm pending.  Goals S/Min A.   Revisions to Treatment Plan: N/A     Medical Summary Current Status: new  cauda equina syndrome, neurogenic bladder; questionable bowel Weekly Focus/Goal: bladder continence; eval pending  Barriers to Discharge: Decreased family/caregiver support;Home enviroment access/layout;Incontinence;Neurogenic Bowel & Bladder;Weight;Other (comments)  Barriers to Discharge Comments: new SCI Possible Resolutions to Barriers: bladder/bowel   Continued Need for Acute Rehabilitation Level of Care: The patient requires daily medical management by a physician with specialized training in physical medicine and rehabilitation for the following reasons: Direction of a multidisciplinary physical rehabilitation program to maximize functional independence : Yes Medical management of patient stability for increased activity during participation in an intensive rehabilitation regime.: Yes Analysis of laboratory values and/or radiology reports with any subsequent need for medication adjustment and/or medical intervention. : Yes   I attest that I was present, lead the team conference, and concur with the  assessment and plan of the team.   Lelon Frohlich M 11/15/2018, 12:02 PM  Team conference was held via web/ teleconference due to COVID - 19

## 2018-11-14 NOTE — Progress Notes (Signed)
VASCULAR LAB PRELIMINARY  PRELIMINARY  PRELIMINARY  PRELIMINARY  Bilateral lower extremity venous duplex completed.    Preliminary report:  See CV proc for preliminary results.  July Nickson, RVT 11/14/2018, 6:04 PM

## 2018-11-14 NOTE — Progress Notes (Signed)
[facility] PHYSICAL MEDICINE & REHABILITATION PROGRESS NOTE   Subjective/Complaints:  Pt reports 2BMs yesterdays- thinks he got cleaned out.  Wants to sleep in bedside chair since has more support on back.     Objective:   No results found. Recent Labs    11/14/18 0519  WBC 9.0  HGB 12.3*  HCT 37.4*  PLT 290   Recent Labs    11/14/18 0519  NA 137  K 4.1  CL 97*  CO2 30  GLUCOSE 138*  BUN 19  CREATININE 1.12  CALCIUM 9.2    Intake/Output Summary (Last 24 hours) at 11/14/2018 0940 Last data filed at 11/14/2018 0500 Gross per 24 hour  Intake -  Output 750 ml  Net -750 ml     Physical Exam: Vital Signs Blood pressure 140/82, pulse 84, temperature 99.1 F (37.3 C), temperature source Oral, resp. rate 16, weight 99.3 kg, SpO2 97 %.  Physical Exam Nursing noteand vitals and labsreviewed. Constitutional: awake, alert, nursing in room, sitting in bedside chair, wearing LSO, NAD HENT:  Head:Normocephalicand atraumatic.  Nose:Nose normal.  Mouth/Throat: Nooropharyngeal exudate.  Eyes:.Conjunctivaeand EOMare normal.  Neck:Normal range of motion.Neck supple. Cardiovascular:  RRR  Respiratory: CTA B/L with no wheezes, rales or rhonchi heard QQ:PYPP, NT, ND, (+)BS Genitourinary: Genitourinary Comments: Has foley- light amber urine Musculoskeletal:  General: No tendernessor edema.  Comments: UEs 5/5 in deloitds, biceps, WE, triceps, grip and finger abd B/L  LEs- RLE HF 2/5, KE 2/5, DF 2/5, PF 1/5, EHL 0/5 LLE HF 2/5 (slightly stronger than R), KE 3/5, DF 3/5, PF 2/5, EHL 2/5  Neurological: He is alertand oriented to person, place, and time. Nocranial nerve deficit. Patient is alert . Follows commands. Oriented x3. Decreased sensation ONLY at R S1 on exam- tested dermatomes on arms, torso and legs- otherwise intact everywhere else. Flaccid partial paralysis Skin: Back incision is dressed Psychiatric:  Calm, full  affect, Ox3; appropriate   Assessment/Plan: 1. Functional deficits secondary to cauda equina syndrome  which require 3+ hours per day of interdisciplinary therapy in a comprehensive inpatient rehab setting.  Physiatrist is providing close team supervision and 24 hour management of active medical problems listed below.  Physiatrist and rehab team continue to assess barriers to discharge/monitor patient progress toward functional and medical goals  Care Tool:  Bathing              Bathing assist       Upper Body Dressing/Undressing Upper body dressing        Upper body assist      Lower Body Dressing/Undressing Lower body dressing            Lower body assist       Toileting Toileting    Toileting assist Assist for toileting: Moderate Assistance - Patient 50 - 74%     Transfers Chair/bed transfer  Transfers assist           Locomotion Ambulation   Ambulation assist              Walk 10 feet activity   Assist           Walk 50 feet activity   Assist           Walk 150 feet activity   Assist           Walk 10 feet on uneven surface  activity   Assist           Wheelchair     Assist  Wheelchair 50 feet with 2 turns activity    Assist            Wheelchair 150 feet activity     Assist          Blood pressure 140/82, pulse 84, temperature 99.1 F (37.3 C), temperature source Oral, resp. rate 16, weight 99.3 kg, SpO2 97 %.  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 2. Antithrombotics: -DVT/anticoagulation:SCDs. Check vascular study -antiplatelet therapy: N/A 3. Pain Management:Hydrocodone as needed 4. Mood:Provide emotional support -antipsychotic agents: N/A 5. Neuropsych: This  patientiscapable of making decisions on hisown behalf. 6. Skin/Wound Care:Routine skin checks 7. Fluids/Electrolytes/Nutrition:Routine in and outs with follow-up chemistries 8. Neurogenicbowel andbladder. Need to discuss removing Foley tube and begin voiding trial- however since he had >1L of volume in bladder and bladder has been stretched, Urology usually asks [place] to wait ~ 1 week of bladder rest to remove foley- will discuss starting Flomax to maximize potential of pt voiding after foley removed. - pt likely has neurogenic bowel and is during spinal shock- gut doesn't work during spinal shock and needs laxatives daily, as well as suppository nightly as part of bowel program- if pt unable to go on his own today, will put in for Suppository (enemas usually don't work in cauda equina syndrome patients) for this evening.  10/27- pt cleaned out last night- went without suppository- might not have full neurogenic bowel 9. Postoperative anemia. Follow-up CBC 10. SCI education- will educate pt on SCI issues during inpt rehab. Will likely need Rehab physician in [place], [place] and in [place] in future.      LOS: 1 days A FACE TO FACE EVALUATION WAS PERFORMED  Jadah Bobak 11/14/2018, 9:40 AM

## 2018-11-15 ENCOUNTER — Inpatient Hospital Stay (HOSPITAL_COMMUNITY): Payer: Self-pay | Admitting: Occupational Therapy

## 2018-11-15 ENCOUNTER — Encounter (HOSPITAL_COMMUNITY): Payer: Self-pay | Admitting: Psychology

## 2018-11-15 ENCOUNTER — Inpatient Hospital Stay (HOSPITAL_COMMUNITY): Payer: Self-pay | Admitting: Physical Therapy

## 2018-11-15 MED ORDER — LIDOCAINE HCL URETHRAL/MUCOSAL 2 % EX GEL
1.0000 "application " | CUTANEOUS | Status: DC | PRN
  Filled 2018-11-15: qty 5

## 2018-11-15 MED ORDER — BACLOFEN 5 MG HALF TABLET
5.0000 mg | ORAL_TABLET | Freq: Three times a day (TID) | ORAL | Status: DC
  Administered 2018-11-15 – 2018-11-16 (×4): 5 mg via ORAL
  Filled 2018-11-15 (×4): qty 1

## 2018-11-15 MED ORDER — ENOXAPARIN SODIUM 40 MG/0.4ML ~~LOC~~ SOLN
40.0000 mg | SUBCUTANEOUS | Status: DC
  Administered 2018-11-15 – 2018-11-23 (×9): 40 mg via SUBCUTANEOUS
  Filled 2018-11-15 (×9): qty 0.4

## 2018-11-15 MED ORDER — GABAPENTIN 300 MG PO CAPS
300.0000 mg | ORAL_CAPSULE | Freq: Three times a day (TID) | ORAL | Status: DC
  Administered 2018-11-15 – 2018-11-21 (×18): 300 mg via ORAL
  Filled 2018-11-15 (×18): qty 1

## 2018-11-15 NOTE — Progress Notes (Signed)
Physical Therapy Session Note  Patient Details  Name: Joseph Reeves MRN: [id number] Date of Birth: Sep 12, 1965  Today's Date: 11/15/2018 PT Individual Time: 0900-1000 PT Individual Time Calculation (min): 60 min   Short Term Goals: Week 1:  PT Short Term Goal 1 (Week 1): Pt will complete least restrictive transfer with mod A x 1 PT Short Term Goal 2 (Week 1): Pt will initiate w/c mobility PT Short Term Goal 3 (Week 1): Pt will perform sit to stand with mod A to RW  Skilled Therapeutic Interventions/Progress Updates:    Pt received supine in bed with bed flat. Pt reports ongoing pain in B hips shooting into legs with any mobility, describes as "lightning". MD and RN aware and working towards resolving pain. Per MD pt to remain on bedrest until further orders, but pt able to participate in bed level therapy. Pt agreeable to bed level therapy this AM. Supine BLE strengthening therex x 5-10 reps to tolerance with AAROM needed R>L. Pt performs heel slides, hip abd, quad set, SKFO, hip IR/ER x 5-10 reps each. Pt has onset of shooting pain in LLE while performing hip IR/ER, deferred further repetitions of this exercise. Verbal cues for breathing techniques while performing therex as pt tends to hold his breath during exercise. Pt remains very motivated to participate in therapy sessions, limited by pain. Pt left supine in flat bed with needs in reach at end of session.  Therapy Documentation Precautions:  Precautions Precautions: Back, Fall Precaution Booklet Issued: No Required Braces or Orthoses: Spinal Brace Spinal Brace: Lumbar corset, Applied in sitting position Restrictions Weight Bearing Restrictions: No    Therapy/Group: Individual Therapy   Excell Seltzer, PT, DPT  11/15/2018, 11:54 AM

## 2018-11-15 NOTE — Progress Notes (Signed)
Staff got patient up with stedy to commode, patient voided x 2 during shift. Patient voided at 6:40PM, output 200cc. PVR 351. Patient refused to be cath'd at this time due to pain, wants to wait a few mins and will to try and void again. Continue plan of care.  Audie Clear, LPN

## 2018-11-15 NOTE — Progress Notes (Signed)
Occupational Therapy Session Note  Patient Details  Name: Joseph Reeves MRN: [id number] Date of Birth: 04-20-1965  Today's Date: 11/15/2018 OT Individual Time: 1115-1200 and 1350-1445 OT Individual Time Calculation (min): 45 min and 50 min   Short Term Goals: Week 1:  OT Short Term Goal 1 (Week 1): Pt will don lumbar corset with supervision. OT Short Term Goal 2 (Week 1): Pt will perform LB dressing with mod A overall. OT Short Term Goal 3 (Week 1): Pt will perform toilet transfer with max A of 1.  Skilled Therapeutic Interventions/Progress Updates:    Session One: Pt seen for OT session focusing on functional mobility and OOB tolerance.  Pt in supine upon arrival, agreeable to tx session. Verbal orders to proceed with OOB activity.  Pt voiced pain as "manageable" at rest and denied need for intervention. With mobility, required significantly increased time and rest breaks 2/2 muscle spasms and pain.  He transferred to sitting EOB with max A using hospital bed functions and multi-modal cuing for log rolling technique. Initially required heavy UE support and min A to maintain static sitting balance EOB, however, with increased time and VCs for deep breathing techniques, progressed to supervision with hands in lap. Required +2 assist to achieve full stand in order to place down STEDY flaps. Provided assist for hygiene while in standing total A.  He tolerated sitting on STEDY flaps ~5 minutes with supervision, enjoying sitting upright. Transitioned to recliner and mod A for controlled descent onto recliner. Pt left seated in recliner at end of session, all needs in reach.  Education provided throughout session regarding activity progression, continuum of care, back pre-cautions and d/c planning.   Session Two: PT seen for OT ADL bathing/dressing session. Pt sitting up in recliner upon arrival with mother present. Pt voiced feeling much better than in AM session, denying pain and ready  for tx session. Utilized STEDY for functional transfers during session. Stood from recliner with mod A into STEDY and transitioned into w/c.  Completed bathing/dressing routine from w/c level at sink. Set-up/mod I for UB bathing/dressing. Sit>stand in STEDY with min A in order to complete pericare/buttock hygiene with steadying assist. Instructed in use of LH sponge to complete LB bathing. Education/demonstration for use of reacher and sock aid. Pt able to return demonstrate and used to doff/don pants. Attempted standing void on multiple trials, however, without success. Transferred to [facility] over toilet. Pt left seated on toilet, STEDY placed in front and call cord in reach, NT made aware of pt's position.  Education provided regarding SCI, decreased sensation and functional implications, AE, activity progression and d/c planning.    Therapy Documentation Precautions:  Precautions Precautions: Back, Fall Precaution Booklet Issued: No Required Braces or Orthoses: Spinal Brace Spinal Brace: Lumbar corset, Applied in sitting position Restrictions Weight Bearing Restrictions: No   Therapy/Group: Individual Therapy  Shaiann Mcmanamon L 11/15/2018, 7:06 AM

## 2018-11-15 NOTE — Progress Notes (Signed)
Physical Therapy Session Note  Patient Details  Name: Joseph Reeves MRN: [id number] Date of Birth: June 27, 1965  Today's Date: 11/15/2018 PT Individual Time: 5456-2563 PT Individual Time Calculation (min): 40 min   Short Term Goals: Week 1:  PT Short Term Goal 1 (Week 1): Pt will complete least restrictive transfer with mod A x 1 PT Short Term Goal 2 (Week 1): Pt will initiate w/c mobility PT Short Term Goal 3 (Week 1): Pt will perform sit to stand with mod A to RW  Skilled Therapeutic Interventions/Progress Updates:   Pt received sitting in recliner and agreeable to PT.  Stedy transfer with min assist. WC mobility 2107f + 155fwith supervision assist and min cues for doorway management.  Sit<>stand in parallel bars x 2. pregait stepping with BLE 2 x 4 with min-mod assist. PT blocking stance LE throughout to prevent buckle, but Pt noted to support majority of body weight through UE.   [facility] and moderate cues for set up and safety including proper UE placement and anterior Weight shift.   Patient returned to room performed stedy transfer to recliner with min assist. Pt left sitting in recliner with call bell in reach and all needs met.          Therapy Documentation Precautions:  Precautions Precautions: Back, Fall Precaution Booklet Issued: No Required Braces or Orthoses: Spinal Brace Spinal Brace: Lumbar corset, Applied in sitting position Restrictions Weight Bearing Restrictions: No    Vital Signs: Therapy Vitals Temp: 99.1 F (37.3 C) Temp Source: Oral Pulse Rate: (!) 116 Resp: 18 BP: (!) 133/96 Oxygen Therapy SpO2: 100 % O2 Device: Room Air Pain: denies   Therapy/Group: Individual Therapy  AuLorie Phenix0/28/2020, 5:59 PM

## 2018-11-15 NOTE — Progress Notes (Signed)
Joseph Reeves PHYSICAL MEDICINE & REHABILITATION PROGRESS NOTE   Subjective/Complaints:  Pt reports Awful pain since yesterday afternoon- ever since they tried to put him back in bed- starts in groin/inguinal area B/L- shoots/radiates through buttocks and down legs, like lightning with associated muscle spasms- frequent, pulsing/throbbing; most activity, even passing wind sets it off.  Has new urge to void- can't void lying down- painful caths- will order lidocaine. Has new peroneal/post tib DVTs- got OK from surgeon to start lovenox- kpad will be ordered as well.  Objective:   Vas [place] Lower Extremity Venous (dvt)  Result Date: 11/15/2018  Lower Venous Study Risk Factors: Status post L2-3 lumbar decompression including L2 and L3 laminectomy, L2-3 discectomy, 2-3 foraminotomy and decompression of central canal 11/09/2018. Comparison Study: No prior study on file for comparison Performing Technologist: Sherren Kerns RVS  Examination Guidelines: A complete evaluation includes B-mode imaging, spectral Doppler, color Doppler, and power Doppler as needed of all accessible portions of each vessel. Bilateral testing is considered an integral part of a complete examination. Limited examinations for reoccurring indications may be performed as noted.  +---------+---------------+---------+-----------+----------+--------------+ RIGHT    CompressibilityPhasicitySpontaneityPropertiesThrombus Aging +---------+---------------+---------+-----------+----------+--------------+ CFV      Full           Yes      Yes                                 +---------+---------------+---------+-----------+----------+--------------+ SFJ      Full                                                        +---------+---------------+---------+-----------+----------+--------------+ FV Prox  Full                                                         +---------+---------------+---------+-----------+----------+--------------+ FV Mid   Full                                                        +---------+---------------+---------+-----------+----------+--------------+ FV DistalFull                                                        +---------+---------------+---------+-----------+----------+--------------+ PFV      Full                                                        +---------+---------------+---------+-----------+----------+--------------+ POP      Full  sluggish flow  +---------+---------------+---------+-----------+----------+--------------+ PTV      None                                         Acute          +---------+---------------+---------+-----------+----------+--------------+ PERO     None                                         Acute          +---------+---------------+---------+-----------+----------+--------------+   +---------+---------------+---------+-----------+----------+--------------+ LEFT     CompressibilityPhasicitySpontaneityPropertiesThrombus Aging +---------+---------------+---------+-----------+----------+--------------+ CFV      Full           Yes      Yes                                 +---------+---------------+---------+-----------+----------+--------------+ SFJ      Full                                                        +---------+---------------+---------+-----------+----------+--------------+ FV Prox  Full                                                        +---------+---------------+---------+-----------+----------+--------------+ FV Mid   Full                                                        +---------+---------------+---------+-----------+----------+--------------+ FV DistalFull                                                         +---------+---------------+---------+-----------+----------+--------------+ PFV      Full                                                        +---------+---------------+---------+-----------+----------+--------------+ POP      Full                                         sluggish flow  +---------+---------------+---------+-----------+----------+--------------+ PTV      None                                         Acute          +---------+---------------+---------+-----------+----------+--------------+  PERO     None                                         Acute          +---------+---------------+---------+-----------+----------+--------------+     Summary: Right: Findings consistent with acute deep vein thrombosis involving the right posterior tibial veins, and right peroneal veins. Left: Findings consistent with acute deep vein thrombosis involving the left posterior tibial veins, and left peroneal veins.  *See table(s) above for measurements and observations. Electronically signed by Waverly Ferrarihristopher Dickson MD on 11/15/2018 at 6:23:47 AM.    Final    Recent Labs    11/14/18 0519  WBC 9.0  HGB 12.3*  HCT 37.4*  PLT 290   Recent Labs    11/14/18 0519  NA 137  K 4.1  CL 97*  CO2 30  GLUCOSE 138*  BUN 19  CREATININE 1.12  CALCIUM 9.2    Intake/Output Summary (Last 24 hours) at 11/15/2018 0859 Last data filed at 11/15/2018 0536 Gross per 24 hour  Intake 620 ml  Output 2100 ml  Net -1480 ml     Physical Exam: Vital Signs Blood pressure 130/72, pulse 82, temperature 99 F (37.2 C), temperature source Oral, resp. rate 16, weight 100.5 kg, SpO2 100 %.  Physical Exam Nursing noteand vitals and labsreviewed. Constitutional: lying in bed; mother in bedside chair; wincing and crying out in pain with any activity; c/o severe LE pain/spasms, NAD HENT:  Head:Normocephalicand atraumatic.  Nose:Nose normal.  Mouth/Throat: Nooropharyngeal exudate.   Eyes:.Conjunctivaeand EOMare normal.  Neck:Normal range of motion.Neck supple. Cardiovascular:  RRR  Respiratory: CTA B/L with no wheezes, rales or rhonchi heard ZO:XWRUGI:soft, NT, ND, (+)BS Genitourinary: Genitourinary Comments: foley out Musculoskeletal:  General: No tendernessor edema.  Comments: UEs 5/5 in deltoids, biceps, WE, triceps, grip and finger abd B/L  LEs- RLE HF 2/5, KE 2/5, DF 2/5, PF 1/5, EHL 0/5 LLE HF 2/5 (slightly stronger than R), KE 3/5, DF 3/5, PF 2/5, EHL 2/5  Neurological: He is alertand oriented to person, place, and time. Nocranial nerve deficit. Patient is alert . Follows commands. Oriented x3. Decreased sensation ONLY at R S1 on exam- tested dermatomes on arms, torso and legs- otherwise intact everywhere else. Flaccid partial paralysis Skin: Back incision is dressed Psych: Anxious due to pain   Assessment/Plan: 1. Functional deficits secondary to cauda equina syndrome  which require 3+ hours per day of interdisciplinary therapy in a comprehensive inpatient rehab setting.  Physiatrist is providing close team supervision and 24 hour management of active medical problems listed below.  Physiatrist and rehab team continue to assess barriers to discharge/monitor patient progress toward functional and medical goals  Care Tool:  Bathing    Body parts bathed by patient: Right arm, Left arm, Chest, Abdomen, Front perineal area, Right upper leg, Left upper leg, Face   Body parts bathed by helper: Right lower leg, Left lower leg, Buttocks     Bathing assist Assist Level: Maximal Assistance - Patient 24 - 49%     Upper Body Dressing/Undressing Upper body dressing   What is the patient wearing?: Orthosis    Upper body assist Assist Level: Total Assistance - Patient < 25%    Lower Body Dressing/Undressing Lower body dressing      What is the patient wearing?: Pants     Lower body assist Assist for lower body  dressing: Maximal Assistance - Patient 25 - 49%     Toileting Toileting    Toileting assist Assist for toileting: Total Assistance - Patient < 25%     Transfers Chair/bed transfer  Transfers assist  Chair/bed transfer activity did not occur: Safety/medical concerns  Chair/bed transfer assist level: Dependent - mechanical lift(stedy)     Locomotion Ambulation   Ambulation assist   Ambulation activity did not occur: Safety/medical concerns          Walk 10 feet activity   Assist  Walk 10 feet activity did not occur: Safety/medical concerns        Walk 50 feet activity   Assist Walk 50 feet with 2 turns activity did not occur: Safety/medical concerns         Walk 150 feet activity   Assist Walk 150 feet activity did not occur: Safety/medical concerns         Walk 10 feet on uneven surface  activity   Assist Walk 10 feet on uneven surfaces activity did not occur: Safety/medical concerns         Wheelchair     Assist Will patient use wheelchair at discharge?: Yes Type of Wheelchair: Manual Wheelchair activity did not occur: Safety/medical concerns         Wheelchair 50 feet with 2 turns activity    Assist    Wheelchair 50 feet with 2 turns activity did not occur: Safety/medical concerns       Wheelchair 150 feet activity     Assist  Wheelchair 150 feet activity did not occur: Safety/medical concerns       Blood pressure 130/72, pulse 82, temperature 99 F (37.2 C), temperature source Oral, resp. rate 16, weight 100.5 kg, SpO2 100 %.  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 2. Antithrombotics: -DVT/anticoagulation:SCDs. Check vascular study 10/28- has peroneal/post tibial DVT- since below knee, will start Lovenox 40 mg daily x 90 days from surgery; will recheck  Dopplers next week -antiplatelet therapy: N/A 3. Pain Management:Hydrocodone as needed  10/28- Start Gabapentin 300 mg TID- hopefully will kick in soon, Baclofen 5 mg TID and con't Norco prn 4. Mood:Provide emotional support -antipsychotic agents: N/A 5. Neuropsych: This patientiscapable of making decisions on hisown behalf. 6. Skin/Wound Care:Routine skin checks 7. Fluids/Electrolytes/Nutrition:Routine in and outs with follow-up chemistries 8. Neurogenicbowel andbladder. Need to discuss removing Foley tube and begin voiding trial- however since he had >1L of volume in bladder and bladder has been stretched, Urology usually asks [place] to wait ~ 1 week of bladder rest to remove foley- will discuss starting Flomax to maximize potential of pt voiding after foley removed. - pt likely has neurogenic bowel and is during spinal shock- gut doesn't work during spinal shock and needs laxatives daily, as well as suppository nightly as part of bowel program- if pt unable to go on his own today, will put in for Suppository (enemas usually don't work in cauda equina syndrome patients) for this evening.  10/27- pt cleaned out last night- went without suppository- might not have full neurogenic bowel  10/28- are in/out cathing- will order lidocaine jelly prn; started flomax last night; hopefully will be able to void. 9. Postoperative anemia. Follow-up CBC 10. SCI education- will educate pt on SCI issues during inpt rehab. Will likely need Rehab physician in [place], [place] and in [place] in future.      LOS:  2 days A FACE TO FACE EVALUATION WAS PERFORMED  Joseph Reeves 11/15/2018, 8:59 AM

## 2018-11-16 ENCOUNTER — Inpatient Hospital Stay (HOSPITAL_COMMUNITY): Payer: Self-pay | Admitting: Occupational Therapy

## 2018-11-16 ENCOUNTER — Inpatient Hospital Stay (HOSPITAL_COMMUNITY): Payer: Self-pay | Admitting: Physical Therapy

## 2018-11-16 LAB — CULTURE, BLOOD (ROUTINE X 2)
Culture: NO GROWTH
Culture: NO GROWTH
Special Requests: ADEQUATE
Special Requests: ADEQUATE

## 2018-11-16 MED ORDER — TRAZODONE HCL 50 MG PO TABS
25.0000 mg | ORAL_TABLET | Freq: Every evening | ORAL | Status: DC | PRN
  Filled 2018-11-16: qty 1

## 2018-11-16 MED ORDER — BACLOFEN 10 MG PO TABS
10.0000 mg | ORAL_TABLET | Freq: Every day | ORAL | Status: DC
  Administered 2018-11-16 – 2018-11-29 (×14): 10 mg via ORAL
  Filled 2018-11-16 (×15): qty 1

## 2018-11-16 MED ORDER — BACLOFEN 10 MG PO TABS
5.0000 mg | ORAL_TABLET | Freq: Three times a day (TID) | ORAL | Status: DC
  Administered 2018-11-16 – 2018-11-30 (×42): 5 mg via ORAL
  Filled 2018-11-16 (×18): qty 1
  Filled 2018-11-16: qty 0.5
  Filled 2018-11-16 (×24): qty 1

## 2018-11-16 NOTE — Progress Notes (Signed)
[place] PHYSICAL MEDICINE & REHABILITATION PROGRESS NOTE   Subjective/Complaints:  Pt reports pain is a lot better- but not sure got his Baclofen last night (was ordered) since had a lot more pain when laid down again.  Does better when sitting up, but laying down causes more pain. LBM today- voided x2 yesterday evening- based on documentation, didn't empty, but did voided a good amount (200cc one of times)   Objective:   Vas [place] Lower Extremity Venous (dvt)  Result Date: 11/15/2018  Lower Venous Study Risk Factors: Status post L2-3 lumbar decompression including L2 and L3 laminectomy, L2-3 discectomy, 2-3 foraminotomy and decompression of central canal 11/09/2018. Comparison Study: No prior study on file for comparison Performing Technologist: Sherren Kerns RVS  Examination Guidelines: A complete evaluation includes B-mode imaging, spectral Doppler, color Doppler, and power Doppler as needed of all accessible portions of each vessel. Bilateral testing is considered an integral part of a complete examination. Limited examinations for reoccurring indications may be performed as noted.  +---------+---------------+---------+-----------+----------+--------------+ RIGHT    CompressibilityPhasicitySpontaneityPropertiesThrombus Aging +---------+---------------+---------+-----------+----------+--------------+ CFV      Full           Yes      Yes                                 +---------+---------------+---------+-----------+----------+--------------+ SFJ      Full                                                        +---------+---------------+---------+-----------+----------+--------------+ FV Prox  Full                                                        +---------+---------------+---------+-----------+----------+--------------+ FV Mid   Full                                                         +---------+---------------+---------+-----------+----------+--------------+ FV DistalFull                                                        +---------+---------------+---------+-----------+----------+--------------+ PFV      Full                                                        +---------+---------------+---------+-----------+----------+--------------+ POP      Full                                         sluggish flow  +---------+---------------+---------+-----------+----------+--------------+ PTV  None                                         Acute          +---------+---------------+---------+-----------+----------+--------------+ PERO     None                                         Acute          +---------+---------------+---------+-----------+----------+--------------+   +---------+---------------+---------+-----------+----------+--------------+ LEFT     CompressibilityPhasicitySpontaneityPropertiesThrombus Aging +---------+---------------+---------+-----------+----------+--------------+ CFV      Full           Yes      Yes                                 +---------+---------------+---------+-----------+----------+--------------+ SFJ      Full                                                        +---------+---------------+---------+-----------+----------+--------------+ FV Prox  Full                                                        +---------+---------------+---------+-----------+----------+--------------+ FV Mid   Full                                                        +---------+---------------+---------+-----------+----------+--------------+ FV DistalFull                                                        +---------+---------------+---------+-----------+----------+--------------+ PFV      Full                                                         +---------+---------------+---------+-----------+----------+--------------+ POP      Full                                         sluggish flow  +---------+---------------+---------+-----------+----------+--------------+ PTV      None                                         Acute          +---------+---------------+---------+-----------+----------+--------------+ PERO     None  Acute          +---------+---------------+---------+-----------+----------+--------------+     Summary: Right: Findings consistent with acute deep vein thrombosis involving the right posterior tibial veins, and right peroneal veins. Left: Findings consistent with acute deep vein thrombosis involving the left posterior tibial veins, and left peroneal veins.  *See table(s) above for measurements and observations. Electronically signed by Waverly Ferrari MD on 11/15/2018 at 6:23:47 AM.    Final    Recent Labs    11/14/18 0519  WBC 9.0  HGB 12.3*  HCT 37.4*  PLT 290   Recent Labs    11/14/18 0519  NA 137  K 4.1  CL 97*  CO2 30  GLUCOSE 138*  BUN 19  CREATININE 1.12  CALCIUM 9.2    Intake/Output Summary (Last 24 hours) at 11/16/2018 0928 Last data filed at 11/16/2018 0146 Gross per 24 hour  Intake 480 ml  Output 1375 ml  Net -895 ml     Physical Exam: Vital Signs Blood pressure 125/73, pulse 88, temperature 98.5 F (36.9 C), temperature source Oral, resp. rate 18, weight 100.5 kg, SpO2 99 %.  Physical Exam Nursing noteand vitalsreviewed. Constitutional: on toilet with sara steady; appropriate, having BM, NAD HENT:  Head:Normocephalicand atraumatic.  Nose:Nose normal.  Mouth/Throat: Nooropharyngeal exudate.  Eyes:.Conjunctivaeand EOMare normal.  Neck:Normal range of motion.Neck supple. Cardiovascular:  RRR  Respiratory: CTA B/L with no wheezes, rales or rhonchi heard WU:JWJX, NT, ND, (+)BS Genitourinary:  Genitourinary Comments: foley out Musculoskeletal:  General: No tendernessor edema.  Comments: UEs 5/5 in deltoids, biceps, WE, triceps, grip and finger abd B/L  LEs- RLE HF 2/5, KE 2/5, DF 2/5, PF 1/5, EHL 0/5 LLE HF 2/5 (slightly stronger than R), KE 3/5, DF 3/5, PF 2/5, EHL 2/5  Neurological: He is alertand oriented to person, place, and time. Nocranial nerve deficit. Patient is alert . Follows commands. Oriented x3. Decreased sensation ONLY at R S1 on exam- tested dermatomes on arms, torso and legs- otherwise intact everywhere else. Flaccid partial paralysis Skin: Back incision is dressed Psych: less anxious- brighter   Assessment/Plan: 1. Functional deficits secondary to cauda equina syndrome  which require 3+ hours per day of interdisciplinary therapy in a comprehensive inpatient rehab setting.  Physiatrist is providing close team supervision and 24 hour management of active medical problems listed below.  Physiatrist and rehab team continue to assess barriers to discharge/monitor patient progress toward functional and medical goals  Care Tool:  Bathing    Body parts bathed by patient: Right arm, Left arm, Chest, Abdomen, Front perineal area, Right upper leg, Left upper leg, Face, Buttocks, Right lower leg, Left lower leg   Body parts bathed by helper: Right lower leg, Left lower leg, Buttocks     Bathing assist Assist Level: Minimal Assistance - Patient > 75%(Standing in STEDY to complete pericare/buttock hygiene)     Upper Body Dressing/Undressing Upper body dressing   What is the patient wearing?: Pull over shirt, Orthosis    Upper body assist Assist Level: Contact Guard/Touching assist    Lower Body Dressing/Undressing Lower body dressing      What is the patient wearing?: Pants     Lower body assist Assist for lower body dressing: Minimal Assistance - Patient > 75%     Toileting Toileting    Toileting assist Assist for  toileting: Dependent - Patient 0%(Stedy)     Transfers Chair/bed transfer  Transfers assist  Chair/bed transfer activity did not occur: Safety/medical concerns  Chair/bed transfer assist level:  Dependent - mechanical lift     Locomotion Ambulation   Ambulation assist   Ambulation activity did not occur: Safety/medical concerns          Walk 10 feet activity   Assist  Walk 10 feet activity did not occur: Safety/medical concerns        Walk 50 feet activity   Assist Walk 50 feet with 2 turns activity did not occur: Safety/medical concerns         Walk 150 feet activity   Assist Walk 150 feet activity did not occur: Safety/medical concerns         Walk 10 feet on uneven surface  activity   Assist Walk 10 feet on uneven surfaces activity did not occur: Safety/medical concerns         Wheelchair     Assist Will patient use wheelchair at discharge?: Yes Type of Wheelchair: Manual Wheelchair activity did not occur: Safety/medical concerns         Wheelchair 50 feet with 2 turns activity    Assist    Wheelchair 50 feet with 2 turns activity did not occur: Safety/medical concerns       Wheelchair 150 feet activity     Assist  Wheelchair 150 feet activity did not occur: Safety/medical concerns       Blood pressure 125/73, pulse 88, temperature 98.5 F (36.9 C), temperature source Oral, resp. rate 18, weight 100.5 kg, SpO2 99 %.  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 2. Antithrombotics: -DVT/anticoagulation:SCDs. Check vascular study 10/28- has peroneal/post tibial DVT- since below knee, will start Lovenox 40 mg daily x 90 days from surgery; will recheck Dopplers next week -antiplatelet therapy: N/A 3. Pain Management:Hydrocodone as needed  10/28- Start  Gabapentin 300 mg TID- hopefully will kick in soon, Baclofen 5 mg TID and con't Norco prn  10/29- pain better controlled- will increase nighttime Baclofen to 10 mg 4. Mood:Provide emotional support -antipsychotic agents: N/A 5. Neuropsych: This patientiscapable of making decisions on hisown behalf. 6. Skin/Wound Care:Routine skin checks 7. Fluids/Electrolytes/Nutrition:Routine in and outs with follow-up chemistries 8. Neurogenicbowel andbladder. Need to discuss removing Foley tube and begin voiding trial- however since he had >1L of volume in bladder and bladder has been stretched, Urology usually asks [place] to wait ~ 1 week of bladder rest to remove foley- will discuss starting Flomax to maximize potential of pt voiding after foley removed. - pt likely has neurogenic bowel and is during spinal shock- gut doesn't work during spinal shock and needs laxatives daily, as well as suppository nightly as part of bowel program- if pt unable to go on his own today, will put in for Suppository (enemas usually don't work in cauda equina syndrome patients) for this evening.  10/27- pt cleaned out last night- went without suppository- might not have full neurogenic bowel  10/28- are in/out cathing- will order lidocaine jelly prn; started flomax last night; hopefully will be able to void.  10/29- voiding some- not emptying yet- con't Flomax 9. Postoperative anemia. Follow-up CBC 10. SCI education- will educate pt on SCI issues during inpt rehab. Will likely need Rehab physician in [place], [place] and in [place] in future.      LOS: 3 days A FACE TO FACE EVALUATION WAS PERFORMED  Srihitha Tagliaferri 11/16/2018, 9:28 AM

## 2018-11-16 NOTE — Progress Notes (Signed)
Social Work Assessment and Plan   Patient Details  Name: Joseph Reeves MRN: [id number] Date of Birth: [birth year]  Today's Date: 11/16/2018  Problem List:  Patient Active Problem List   Diagnosis Date Noted  . Cauda equina syndrome ([place]) 11/13/2018  . Cauda equina syndrome with neurogenic bladder ([place])   . Neurogenic bladder, flaccid   . Neurogenic bowel   . Leg weakness 11/07/2018  . Numbness and tingling of both legs 11/07/2018   Past Medical History:  Past Medical History:  Diagnosis Date  . Sciatica    Past Surgical History:  Past Surgical History:  Procedure Laterality Date  . RADIOLOGY WITH ANESTHESIA N/A 11/07/2018   Procedure: MRI WITH ANESTHESIA;  Surgeon: Radiologist, Medication, MD;  Location: [facility];  Service: Radiology;  Laterality: N/A;   Social History:  reports that he has been smoking cigars. He has never used smokeless tobacco. He reports current alcohol use. He reports that he does not use drugs.  Family / Support Systems Marital Status: (Pt has a long-term girlfriend with whom he lives) Patient Roles: Parent(sign other) Spouse/Significant Other: David Stall @ [phone] Children: two adult daughters living but only has regular contact with one - neither to be caregivers Other Supports: sister, Jana [place], [place]) @ [phone];  sister, Vella Raring ([place]) @ [phone];  mother also in [place], [place] and pt may stay with her for a little while as well. Anticipated Caregiver: Most likely sister, Hilda Blades, to be first caregiver as pt likely to go to her home first. Ability/Limitations of Caregiver: Sister works from home and can provide 24/7 assistanc.e Caregiver Availability: 24/7 Family Dynamics: Pt describes all family as supportive and notes he has "several options" for homes to stay in where there are family members who can assist.  He also notes good relationship between his family and his long-term gf.  Social History Preferred  language: English Religion: None Cultural Background: NA Read: Yes Write: Yes Legal History/Current Legal Issues: None Guardian/Conservator: None - per MD, pt is capable of making decisions on his own behalf.   Abuse/Neglect Abuse/Neglect Assessment Can Be Completed: Yes Physical Abuse: Denies Verbal Abuse: Denies Sexual Abuse: Denies Exploitation of patient/patient's resources: Denies Self-Neglect: Denies  Emotional Status Pt's affect, behavior and adjustment status: Pt lying in bed and reports ongoing pain control issues, however, somewhat controlled at present and willing to complete assessment interview.  He is very pleasant but does grimace at times.  He denies any significant emotional distress, however, is very frustrated with his overal situation and being so far from his home.  Reports he feels he is motivated for therapies.  Will monitor mood as he may benefit from neuropsychology consult while here. Recent Psychosocial Issues: None Psychiatric History: None Substance Abuse History: None  Patient / Family Perceptions, Expectations & Goals Pt/Family understanding of illness & functional limitations: Pt and sister, Rosann Auerbach, with good, general understanding of his medical issues and current functional limitations/ need for CIR. Premorbid pt/family roles/activities: Pt completely independent PTA.  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 Anticipated changes in roles/activities/participation: Per goals of min assist overall, family will need to share in providing caregiver support whichever home he is in. Pt/family expectations/goals: "I just hope this pain can get under control.  I need to be able to get as far as I can (functionally) before I leave."  [employer]: None Premorbid Home Care/DME Agencies: None Transportation available at discharge: yes  Resource referrals recommended:  Neuropsychology  Discharge Planning Living Arrangements: Spouse/significant other Support Systems: Spouse/significant other, Children, Parent, Other relatives, Friends/neighbors Type of Residence: Private residence Insurance Resources: Customer service manager Resources: Employment Financial Screen Referred: Previously completed Living Expenses: Psychologist, sport and exercise Management: Patient, Significant Other Does the patient have any problems obtaining your medications?: Yes (Describe)(uninsured) Home Management: pt and girlfriend Patient/Family Preliminary Plans: Initially upon d/c, pt plans to go to sister's home in [place], [place].  Eventually to return to [facility], however, may stay with his mother for a little bit prior to returing to his own home. 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 Pt actually lives in [place] and was here locally for a job-related course.  He does have good family support but most are living in [place] as well (no one local except a cousin).  He is motivated for therapies, however, pain is significantly limiting still - MD making med adjustments.  He denies any significant emotional distress but will monitor throughout his CIR stay.  He does have d/c plan of initially staying with sister in [place] who is able to provide 24/7 assistance.  Will follow for d/c planning and support needs.  Zhane Bluitt 11/16/2018, 1:40 PM

## 2018-11-16 NOTE — Plan of Care (Signed)
  Problem: Consults Goal: RH GENERAL PATIENT EDUCATION Description: See Patient Education module for education specifics. Outcome: Progressing Goal: Skin Care Protocol Initiated - if Braden Score 18 or less Description: If consults are not indicated, leave blank or document N/A Outcome: Progressing   Problem: RH BOWEL ELIMINATION Goal: RH STG MANAGE BOWEL WITH ASSISTANCE Description: STG Manage Bowel with mod I Assistance. Outcome: Progressing Goal: RH STG MANAGE BOWEL W/MEDICATION W/ASSISTANCE Description: STG Manage Bowel with Medication with mod I Assistance. Outcome: Progressing   Problem: RH BLADDER ELIMINATION Goal: RH STG MANAGE BLADDER WITH ASSISTANCE Description: STG Manage Bladder With min Assistance Outcome: Progressing Goal: RH STG MANAGE BLADDER WITH EQUIPMENT WITH ASSISTANCE Description: STG Manage Bladder With Equipment With min Assistance Outcome: Progressing   Problem: RH SAFETY Goal: RH STG ADHERE TO SAFETY PRECAUTIONS W/ASSISTANCE/DEVICE Description: STG Adhere to Safety Precautions With supervision Assistance/Device. Outcome: Progressing   Problem: RH PAIN MANAGEMENT Goal: RH STG PAIN MANAGED AT OR BELOW PT'S PAIN GOAL Description: Less than 4 on 0-10 scale Outcome: Progressing   Problem: RH KNOWLEDGE DEFICIT GENERAL Goal: RH STG INCREASE KNOWLEDGE OF SELF CARE AFTER HOSPITALIZATION Description: Pt will identify safety and spinal precautions along with medication compliance to manage bowel function prior to DC  Outcome: Progressing   

## 2018-11-16 NOTE — Progress Notes (Signed)
Occupational Therapy Session Note  Patient Details  Name: Arrick Dutton MRN: [id number] Date of Birth: [birth year]  Today's Date: 11/16/2018 OT Individual Time: 0900-1000  & 1415-1535 OT Individual Time Calculation (min): 60 min & 80 min   Short Term Goals: Week 1:  OT Short Term Goal 1 (Week 1): Pt will don lumbar corset with supervision. OT Short Term Goal 2 (Week 1): Pt will perform LB dressing with mod A overall. OT Short Term Goal 3 (Week 1): Pt will perform toilet transfer with max A of 1.  Skilled Therapeutic Interventions/Progress Updates:    am session:  Patient seated in w/c, ready for therapy and denies pain.  Able to self propel w/c in room, occ min A to set up for adl otherwise able to use reacher appropriately to access clothing and supplies.  Completed sponge bath at w/c level with min A for buttocks (using long handled sponge for feet), LB dressing - min A for pants over hips with w/c push up but able to thread bilateral LEs with reacher, able to donn slipper socks with legs crossed CS, UB dressing - OH shirt and orthosis with set up.  Grooming mod I w/c level.  Completed w/c level UB conditioning and trunk control activity with good tolerance and awareness of back precautions.  stedy transfer w/c to toilet at close of session with CGA for sit to stand and min A for pants down.  Patient to call for assistance when finished, pull cord in hand and stedy in front of patient.  Pm session:  Patient seated in recliner, ready for therapy.  stedy transfer recliner to w/c with CGA.  Patient able to propel w/c to/from therapy treatment areas.  SB transfer to/from w/c and mat table with CGA/CS, cues for technique.  He tolerance 40 minutes of unsupported sitting at edge of mat with variety of UB, LB and core stretches and conditioning activities  - good awareness of back precautions.  UB ergometer seated in wc 2 x 5 minutes.  stedy transfer to/from w/c, toilet and back to recliner CGA,  toileting completed with min A for clothing management and max A for hygiene following bowel movement.  Returned to recliner at close of session, call bell in reach, mom present.    Therapy Documentation Precautions:  Precautions Precautions: Back, Fall Precaution Booklet Issued: No Required Braces or Orthoses: Spinal Brace Spinal Brace: Lumbar corset, Applied in sitting position Restrictions Weight Bearing Restrictions: No General:   Vital Signs: Therapy Vitals Temp: 98 F (36.7 C) Pulse Rate: 89 Resp: 16 BP: (!) 135/91 Patient Position (if appropriate): Sitting Oxygen Therapy SpO2: 100 % O2 Device: Room Air Pain: Pain Assessment Pain Scale: 0-10 Pain Score: 0-No pain   Therapy/Group: Individual Therapy  Carlos Levering 11/16/2018, 3:54 PM

## 2018-11-16 NOTE — Progress Notes (Signed)
Vitals:   11/15/18 1829 11/15/18 1948 11/16/18 0432 11/16/18 1325  BP:  139/87 125/73 (!) 135/91  Pulse: (!) 105 99 88 89  Resp:  16 18 16   Temp:  98.7 F (37.1 C) 98.5 F (36.9 C) 98 F (36.7 C)  TempSrc:  Oral Oral   SpO2:  98% 99% 100%  Weight:        CBC Recent Labs    11/14/18 0519  WBC 9.0  HGB 12.3*  HCT 37.4*  PLT 290   BMET Recent Labs    11/14/18 0519  NA 137  K 4.1  CL 97*  CO2 30  GLUCOSE 138*  BUN 19  CREATININE 1.12  CALCIUM 9.2    Patient sitting up in chair, in [place] lumbar corset.  Patient's mother sitting with him.  Patient and his nurse report that his Foley was DC'd, and that he is voiding well.  Continue to work with PT and OT.  Mobility remains limited due to weakness in lower extremities, right weaker than left.  Plan: Continuing therapies.  We will check on next week.  Hosie Spangle, MD 11/16/2018, 4:45 PM

## 2018-11-16 NOTE — Progress Notes (Signed)
Physical Therapy Session Note  Patient Details  Name: Joseph Reeves MRN: [id number] Date of Birth: 1965-04-09  Today's Date: 11/16/2018 PT Individual Time: 1100-1200 PT Individual Time Calculation (min): 60 min   Short Term Goals: Week 1:  PT Short Term Goal 1 (Week 1): Pt will complete least restrictive transfer with mod A x 1 PT Short Term Goal 2 (Week 1): Pt will initiate w/c mobility PT Short Term Goal 3 (Week 1): Pt will perform sit to stand with mod A to RW  Skilled Therapeutic Interventions/Progress Updates:    Pt received seated on toilet in bathroom, agreeable to PT session. No complaints of pain. Sit to stand with min A from elevated BSC to stedy. Pt is dependent for pericare. Stedy transfer to w/c. Pt is able to wash hands independently at sink. Manual w/c propulsion 2 x 150 ft with use of BUE and Supervision level. Pt requires min cueing and min A for management of w/c parts. Slide board transfer w/c to/from level mat table x 2 reps R and L with CGA, v/c for safe setup and head/hips relationship during transfer. Sit to stand with mod A in // bars with R knee blocked, manual and visual cues for hip extension and upright stance. Standing lateral weight shift R/L with R knee blocked, heavy reliance on BUE support. Standing forward/lateral weight shift L/R with min to mod A and R knee blocked. Standing alt L/R mini-marches 2 x 10 reps with heavy reliance on BUE support, R knee blocked. Pt exhibits absent proprioception in RLE. Standing mini-squats x 5 reps with maximum effort to perform R quad extension. Pt requests to return to recliner at end of session. Stedy transfer w/c to recliner. Pt left seated in recliner in room with needs in reach at end of session.  Therapy Documentation Precautions:  Precautions Precautions: Back, Fall Precaution Booklet Issued: No Required Braces or Orthoses: Spinal Brace Spinal Brace: Lumbar corset, Applied in sitting position Restrictions Weight  Bearing Restrictions: No    Therapy/Group: Individual Therapy   Excell Seltzer, PT, DPT  11/16/2018, 3:29 PM

## 2018-11-16 NOTE — Progress Notes (Signed)
Patient  currently resting in recliner at times. Pain controlled all shift. Education provided on medication schedule and asking for prn medications. Patient had multiple continent voids and bowel movements  during shift. Adria Devon, LPN

## 2018-11-16 NOTE — IPOC Note (Signed)
Overall Plan of Care [facility]) Patient Details Name: Joseph Reeves MRN: [id number] DOB: 1965/07/15  Admitting Diagnosis: Cauda equina syndrome [facility])  Hospital Problems: Principal Problem:   Cauda equina syndrome ([place]) Active Problems:   Leg weakness   Cauda equina syndrome with neurogenic bladder (HCC)   Neurogenic bladder, flaccid   Neurogenic bowel     Functional Problem List: Nursing Bladder, Bowel, Medication Management, Pain, Safety  PT Balance, Endurance, Motor, Pain, Safety, Sensory  OT Balance, Endurance, Motor, Pain, Safety  SLP    TR         Basic ADL's: OT Grooming, Bathing, Dressing, Toileting     Advanced  ADL's: OT       Transfers: PT Bed Mobility, Bed to Chair, Car, Furniture, Floor  OT Toilet     Locomotion: PT Ambulation, Emergency planning/management officer, Stairs     Additional Impairments: OT None  SLP        TR      Anticipated Outcomes Item Anticipated Outcome  Self Feeding n/a  Swallowing      Basic self-care  S - mod I  Toileting  S   Bathroom Transfers S  Bowel/Bladder  cont x 2, min assist  Transfers  Supervision  Locomotion  mod I with manual w/c  Communication     Cognition     Pain  less than 4  Safety/Judgment  supervision assist   Therapy Plan: PT Intensity: Minimum of 1-2 x/day ,45 to 90 minutes PT Frequency: 5 out of 7 days PT Duration Estimated Length of Stay: 18-21 days OT Intensity: Minimum of 1-2 x/day, 45 to 90 minutes OT Frequency: 5 out of 7 days OT Duration/Estimated Length of Stay: 18-21 days     Due to the current state of emergency, patients may not be receiving their 3-hours of Medicare-mandated therapy.   Team Interventions: Nursing Interventions Patient/Family Education, Disease Management/Prevention, Discharge Planning, Bladder Management, Bowel Management, Medication Management, Pain Management  PT interventions Ambulation/gait training, Balance/vestibular training, Community reintegration,  Discharge planning, DME/adaptive equipment instruction, Functional electrical stimulation, Functional mobility training, Neuromuscular re-education, Pain management, Patient/family education, Psychosocial support, Splinting/orthotics, Stair training, Therapeutic Activities, Therapeutic Exercise, UE/LE Strength taining/ROM, UE/LE Coordination activities, Wheelchair propulsion/positioning  OT Interventions Training and development officer, DME/adaptive equipment instruction, Patient/family education, Therapeutic Activities, Wheelchair propulsion/positioning, Psychosocial support, Therapeutic Exercise, Community reintegration, Functional mobility training, Self Care/advanced ADL retraining, UE/LE Strength taining/ROM, Discharge planning, Skin care/wound managment, UE/LE Coordination activities, Pain management  SLP Interventions    TR Interventions    SW/CM Interventions Discharge Planning, Psychosocial Support, Patient/Family Education   Barriers to Discharge MD  Medical stability, Home enviroment access/loayout, Neurogenic bowel and bladder, Lack of/limited family support, Insurance for SNF coverage, Weight, Weight bearing restrictions and new SCI patient  Nursing      PT Decreased caregiver support, Medical stability, Home environment access/layout, Neurogenic Bowel & Bladder    OT Home environment access/layout lives in [place] and all bedrooms are on second floor  SLP      SW       Team Discharge Planning: Destination: PT-Home ,OT- Home , SLP-  Projected Follow-up: PT-Home health PT, OT-  24 hour supervision/assistance, Home health OT, SLP-  Projected Equipment Needs: PT-Wheelchair (measurements), Wheelchair cushion (measurements), OT- To be determined, SLP-  Equipment Details: PT-TBD pending progress, OT-  Patient/family involved in discharge planning: PT- Patient, Family member/caregiver,  OT-Patient, SLP-   MD ELOS:18-21 days Medical Rehab Prognosis:  Excellent Assessment: Pt is a 53 yr old  male with 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 Patient's goals are mod I to supervision and should be here ~ 3 weeks.    See Team Conference Notes for weekly updates to the plan of care

## 2018-11-16 NOTE — Care Management (Signed)
Inpatient [facility] Individual Statement of Services  Patient Name:  Joseph Reeves  Date:  11/16/2018  Welcome to the [facility].  Our goal is to provide you with an individualized program based on your diagnosis and situation, designed to meet your specific needs.  With this comprehensive rehabilitation program, you will be expected to participate in at least 3 hours of rehabilitation therapies Monday-Friday, with modified therapy programming on the weekends.  Your rehabilitation program will include the following services:  Physical Therapy (PT), Occupational Therapy (OT), 24 hour per day rehabilitation nursing, Neuropsychology, Case Management (Social Worker), Rehabilitation Medicine, Nutrition Services and Pharmacy Services  Weekly team conferences will be held on Wednesdays to discuss your progress.  Your Social Worker will talk with you frequently to get your input and to update you on team discussions.  Team conferences with you and your family in attendance may also be held.  Expected length of stay: 18-21 days   Overall anticipated outcome: minimal assistance; mod independent at wheelchair  Depending on your progress and recovery, your program may change. Your Social Worker will coordinate services and will keep you informed of any changes. Your Social Worker's name and contact numbers are listed  below.  The following services may also be recommended but are not provided by the [facility] will be made to provide these services after discharge if needed.  Arrangements include referral to agencies that provide these services.  Your insurance has been verified to be:  None Your primary doctor is:  None  Pertinent information will be shared with your doctor and your insurance  company.  Social Worker:  [place], [place] or [phone]   Information discussed with and copy given to patient by: Joseph Reeves, 11/16/2018, 1:43 PM

## 2018-11-17 ENCOUNTER — Inpatient Hospital Stay (HOSPITAL_COMMUNITY): Payer: Self-pay | Admitting: Occupational Therapy

## 2018-11-17 ENCOUNTER — Inpatient Hospital Stay (HOSPITAL_COMMUNITY): Payer: Self-pay | Admitting: Physical Therapy

## 2018-11-17 NOTE — Progress Notes (Signed)
Physical Therapy Session Note  Patient Details  Name: Joseph Reeves MRN: [id number] Date of Birth: [birth year]  Today's Date: 11/17/2018 PT Individual Time: 1100-1200 PT Individual Time Calculation (min): 60 min   Short Term Goals: Week 1:  PT Short Term Goal 1 (Week 1): Pt will complete least restrictive transfer with mod A x 1 PT Short Term Goal 2 (Week 1): Pt will initiate w/c mobility PT Short Term Goal 3 (Week 1): Pt will perform sit to stand with mod A to RW  Skilled Therapeutic Interventions/Progress Updates:    Pt received seated in recliner in room, agreeable to PT session. No complaints of pain. Sit to stand with min A to stedy. Stedy transfer recliner to w/c. Manual w/c propulsion 2 x 150 ft with use of BUE and Supervision. Sit to stand with min A in // bars progressing to mod A with onset of fatigue. Session focus on BLE NMR in standing. Focus on upright stance with hip extension and R knee extension with use of mirror for visual feedback for upright posture. Standing alt L/R marches 2 x 10-15 reps to fatigue with R knee blocked in stance and heavy reliance on BUE support in // bars. Ambulation 2 x 5' in // bars with mod to max A, close w/c follow for safety with R knee blocked in stance, visual cues for RLE placement due to absent proprioception. Pt exhibits significant reliance on BUE with gait. Standing forward/backward steps with alt LE 2 x 10 reps with mod A for standing balance, R knee blocked. Stedy transfer back to recliner at end of session. Pt left seated in recliner with needs in reach. Cotreatment session with RT.  Therapy Documentation Precautions:  Precautions Precautions: Back, Fall Precaution Booklet Issued: No Required Braces or Orthoses: Spinal Brace Spinal Brace: Lumbar corset, Applied in sitting position Restrictions Weight Bearing Restrictions: No    Therapy/Group: Individual Therapy   Excell Seltzer, PT, DPT  11/17/2018, 3:47 PM

## 2018-11-17 NOTE — Evaluation (Signed)
Recreational Therapy Assessment and Plan  Patient Details  Name: Joseph Reeves MRN: [id number] Date of Birth: 1965-09-07 Today's Date: 11/17/2018  Rehab Potential: Good ELOS: 3 weeks   Assessment  Problem List:      Patient Active Problem List   Diagnosis Date Noted  . Cauda equina syndrome ([place]) 11/13/2018  . Cauda equina syndrome with neurogenic bladder ([place])   . Neurogenic bladder, flaccid   . Neurogenic bowel   . Leg weakness 11/07/2018  . Numbness and tingling of both legs 11/07/2018    Past Medical History:      Past Medical History:  Diagnosis Date  . Sciatica    Past Surgical History:       Past Surgical History:  Procedure Laterality Date  . RADIOLOGY WITH ANESTHESIA N/A 11/07/2018   Procedure: MRI WITH ANESTHESIA;  Surgeon: Radiologist, Medication, MD;  Location: [facility];  Service: Radiology;  Laterality: N/A;    Assessment & Plan Clinical Impression:  Joseph Reeves is a 53 year old right-handed male with history of tobacco abuse(rate cigar)as well as sciatica status post ESI in the remote past and maintained on Valium,mobicas well as oxycodone as needed. Per chart review patient lives with significant other. 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 He has a sister in [place] as well as another sister in [place] with good support. Presented 11/07/2018 with numbness in his buttocks bilaterally as well as legs and feet. The numbness in his right leg and foot are greater than the left leg. He also describes weakness in the lower extremities right greater than left. Patient also noticed some difficulty with urination. CT/MRI of the brain unremarkable. Sedimentation rate within normal limits at 15, SARS coronavirus negative, urine drug screen positive benzos. LP completed CSF inconclusive due to bloody tap. 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 Underwent L2-3 lumbar decompression including L2 and L3 laminectomy, L2-3 discectomy, 2-3 foraminotomy and decompression of central canal 11/09/2018 per Dr. Sherwood Gambler. Hospital course pain management. Acute blood loss anemia 10.7. Patient with low-grade fever 101 urine study negativeand chest x-ray showed no active disease. Blood cultures no growth to date. Patient with bouts of urinary retention with residuals greater than 900 and a Foley tube was placed. Therapy evaluations completed back brace when out of bed. Patient to beadmitted for a comprehensive rehab program. Patient transferred to CIR on 11/13/2018 .   Pt presents with decreased activity tolerance, decreased functional mobility, decreased balance Limiting pt's independence with leisure/community pursuits.   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);[employer];Nature - Fishing;Games - Other (Comment)(shooting pool, Community education officer) Leisure Participation Style: With Family/Friends Awareness of Community Resources: Excellent Psychosocial / Spiritual Social interaction - Mood/Behavior: Cooperative Academic librarian Appropriate for Education?: Yes Strengths/Weaknesses Patient Strengths/Abilities: Willingness to participate;Active premorbidly Patient weaknesses: Physical limitations TR Patient demonstrates impairments in the following area(s): Endurance;Motor;Pain;Skin Integrity  Plan Rec Therapy Plan Is patient appropriate for Therapeutic Recreation?: Yes Rehab Potential: Good Treatment times per week: Min 1 TR session >20 minutes during LOS Estimated Length of Stay: 3 weeks TR Treatment/Interventions: Adaptive equipment instruction;Community reintegration;Patient/family education;1:1 session;Functional  mobility training;Balance/vestibular training;Therapeutic activities;Wheelchair propulsion/positioning;Recreation/leisure participation  Recommendations for other services: None   Discharge Criteria: Patient will be discharged from TR if patient refuses treatment 3 consecutive times without medical reason.  If  treatment goals not met, if there is a change in medical status, if patient makes no progress towards goals or if patient is discharged from hospital.  The above assessment, treatment plan, treatment alternatives and goals were discussed and mutually agreed upon: by patient  [place] 11/17/2018, 12:14 PM

## 2018-11-17 NOTE — Progress Notes (Signed)
Occupational Therapy Session Note  Patient Details  Name: Joseph Reeves MRN: [id number] Date of Birth: 05-Feb-1965  Today's Date: 11/17/2018 OT Individual Time: 5053-9767 and 1350-1500 OT Individual Time Calculation (min): 70 min and 70 min   Short Term Goals: Week 1:  OT Short Term Goal 1 (Week 1): Pt will don lumbar corset with supervision. OT Short Term Goal 2 (Week 1): Pt will perform LB dressing with mod A overall. OT Short Term Goal 3 (Week 1): Pt will perform toilet transfer with max A of 1.  Skilled Therapeutic Interventions/Progress Updates:    Session One: Transfers: STEDY recliner<>w/c with supervision to power into standing.  Sliding board transfer w/c<> EOM and w/c<> drop arm BSC with CGA and VCs for technique and assist for set-up.  ADL re-training: Toileting task on BSC, lateral leans to pull pants down and reaching underneath BSC in order to complete hygiene. Assist provided to pull pants up with pt completing push up on armrests and therapist managing clothing.  Grooming tasks completed from w/c level at sink mod I.   Ther Ex: Seated EOM , x2 sets of 5 of the following, rest breaks provided throughout and VCs for proper form and technique    Heel slides R/L  LAQ R/L  Glute squeeze  Pt returned to recliner in room at end of session, all needs in reach.   Session Two: Pt seen for OT ADL bathing/dressing session. Pt sitting in recliner upon arrival, agreeable to tx session. He denied pain at rest, however, pain increasing with mobility, RN made aware and pain medication administered at start of session. Utilized STEDY for transfer to padded TTB in shower. He bathed seated on TTB with set-up and use of LH sponge. Used cut-out feature of padded tub bench to complete buttock hygiene.  Returned to w/c to dress, set-up UB dressing. He threaded pants on with use of reacher. Began lateral leans in order to pull pants up, however, pt began sweating due to hard work of  multiple lateral leans and requesting therapist to assist, completed w/c push up in order for pants to be advanced over hips. He self propelled w/c to ADL apartment. Completed x2 sliding board transfers w/c<> low soft surface couch. Initially with min A progressing to CGA with assist to stabilize equipment. Pt able to place board independently and VCs provided throughout for technique. Pt returned to room at end of session, transitioned to recliner and left with all needs in reach and mother present. Pt provided with home w/c measurement sheet. Education provided throughout session regarding decreased sensation in LEs and functional implications, activity progression, and d/c planning.   Therapy Documentation Precautions:  Precautions Precautions: Back, Fall Precaution Booklet Issued: No Required Braces or Orthoses: Spinal Brace Spinal Brace: Lumbar corset, Applied in sitting position Restrictions Weight Bearing Restrictions: No   Therapy/Group: Individual Therapy  Wess Baney L 11/17/2018, 7:02 AM

## 2018-11-17 NOTE — Progress Notes (Signed)
[facility] PHYSICAL MEDICINE & REHABILITATION PROGRESS NOTE   Subjective/Complaints:  Pt reports LBM this AM- slept in bedside chair after 2am last night- no more muscle spasms or nerve pain when he slept in chair.  Continent of bowel and bladder- no caths currently required.    ROS: denies CP, SOB, N/V/D Objective:   No results found. No results for input(s): WBC, HGB, HCT, PLT in the last 72 hours. No results for input(s): NA, K, CL, CO2, GLUCOSE, BUN, CREATININE, CALCIUM in the last 72 hours.  Intake/Output Summary (Last 24 hours) at 11/17/2018 0843 Last data filed at 11/17/2018 0716 Gross per 24 hour  Intake 462 ml  Output 3269 ml  Net -2807 ml     Physical Exam: Vital Signs Blood pressure 138/79, pulse 90, temperature 98.6 F (37 C), resp. rate 18, weight 100.5 kg, SpO2 99 %.  Physical Exam Nursing noteand vitalsreviewed. Constitutional:sitting up in bedside chair; NT in room; appropriate, bright affect, NAD HENT:  Head:Normocephalicand atraumatic.  Nose:Nose normal.  Mouth/Throat: Nooropharyngeal exudate.  Eyes:.conjugate gaze Neck:Normal range of motion.Neck supple. Cardiovascular:  RRR  Respiratory: CTA B/L with no wheezes, rales or rhonchi heard IN:OMVE, NT, ND, (+)BS Genitourinary: Genitourinary Comments: foley out Musculoskeletal:  General: No tendernessor edema.  Comments: UEs 5/5 in deltoids, biceps, WE, triceps, grip and finger abd B/L  LEs- RLE HF 2/5, KE 2/5, DF 2/5, PF 1/5, EHL 0/5 LLE HF 2/5 (slightly stronger than R), KE 3/5, DF 3/5, PF 2/5, EHL 2/5  Neurological: He is alertand oriented to person, place, and time. Nocranial nerve deficit. Patient is alert . Follows commands. Oriented x3. Decreased sensation ONLY at R S1 on exam- tested dermatomes on arms, torso and legs- otherwise intact everywhere else. Flaccid partial paralysis Skin: Back incision is dressed Psych: less anxious-  brighter   Assessment/Plan: 1. Functional deficits secondary to cauda equina syndrome  which require 3+ hours per day of interdisciplinary therapy in a comprehensive inpatient rehab setting.  Physiatrist is providing close team supervision and 24 hour management of active medical problems listed below.  Physiatrist and rehab team continue to assess barriers to discharge/monitor patient progress toward functional and medical goals  Care Tool:  Bathing    Body parts bathed by patient: Right arm, Left arm, Chest, Abdomen, Front perineal area, Right upper leg, Left upper leg, Face, Buttocks, Right lower leg, Left lower leg   Body parts bathed by helper: Buttocks     Bathing assist Assist Level: Minimal Assistance - Patient > 75%     Upper Body Dressing/Undressing Upper body dressing   What is the patient wearing?: Pull over shirt, Orthosis    Upper body assist Assist Level: Set up assist    Lower Body Dressing/Undressing Lower body dressing      What is the patient wearing?: Pants     Lower body assist Assist for lower body dressing: Minimal Assistance - Patient > 75%     Toileting Toileting    Toileting assist Assist for toileting: Dependent - Patient 0%(stedy)     Transfers Chair/bed transfer  Transfers assist  Chair/bed transfer activity did not occur: Safety/medical concerns  Chair/bed transfer assist level: Dependent - mechanical lift     Locomotion Ambulation   Ambulation assist   Ambulation activity did not occur: Safety/medical concerns          Walk 10 feet activity   Assist  Walk 10 feet activity did not occur: Safety/medical concerns        Walk  50 feet activity   Assist Walk 50 feet with 2 turns activity did not occur: Safety/medical concerns         Walk 150 feet activity   Assist Walk 150 feet activity did not occur: Safety/medical concerns         Walk 10 feet on uneven surface  activity   Assist Walk 10 feet  on uneven surfaces activity did not occur: Safety/medical concerns         Wheelchair     Assist Will patient use wheelchair at discharge?: Yes Type of Wheelchair: Manual Wheelchair activity did not occur: Safety/medical concerns  Wheelchair assist level: Supervision/Verbal cueing Max wheelchair distance: 150'    Wheelchair 50 feet with 2 turns activity    Assist    Wheelchair 50 feet with 2 turns activity did not occur: Safety/medical concerns   Assist Level: Supervision/Verbal cueing   Wheelchair 150 feet activity     Assist  Wheelchair 150 feet activity did not occur: Safety/medical concerns   Assist Level: Supervision/Verbal cueing   Blood pressure 138/79, pulse 90, temperature 98.6 F (37 C), resp. rate 18, weight 100.5 kg, SpO2 99 %.  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 2. Antithrombotics: -DVT/anticoagulation:SCDs. Check vascular study 10/28- has peroneal/post tibial DVT- since below knee, will start Lovenox 40 mg daily x 90 days from surgery; will recheck Dopplers next week -antiplatelet therapy: N/A 3. Pain Management:Hydrocodone as needed  10/28- Start Gabapentin 300 mg TID- hopefully will kick in soon, Baclofen 5 mg TID and con't Norco prn  10/29- pain better controlled- will increase nighttime Baclofen to 10 mg 4. Mood:Provide emotional support -antipsychotic agents: N/A 5. Neuropsych: This patientiscapable of making decisions on hisown behalf. 6. Skin/Wound Care:Routine skin checks 7. Fluids/Electrolytes/Nutrition:Routine in and outs with follow-up chemistries 8. Neurogenicbowel andbladder. Need to discuss removing Foley tube and begin voiding trial- however since he had >1L of volume in bladder and bladder has been stretched, Urology usually asks [place] to  wait ~ 1 week of bladder rest to remove foley- will discuss starting Flomax to maximize potential of pt voiding after foley removed. - pt likely has neurogenic bowel and is during spinal shock- gut doesn't work during spinal shock and needs laxatives daily, as well as suppository nightly as part of bowel program- if pt unable to go on his own today, will put in for Suppository (enemas usually don't work in cauda equina syndrome patients) for this evening.  10/27- pt cleaned out last night- went without suppository- might not have full neurogenic bowel  10/28- are in/out cathing- will order lidocaine jelly prn; started flomax last night; hopefully will be able to void.  10/29- voiding some- not emptying yet- con't Flomax  10/30- emptying and voiding and having BMs- continent- no caths required in 24+ hrs. Will see if can stop Flomax last week of rehab. 9. Postoperative anemia. Follow-up CBC 10. SCI education- will educate pt on SCI issues during inpt rehab. Will likely need Rehab physician in [place], [place] and in [place] in future.      LOS: 4 days A FACE TO FACE EVALUATION WAS PERFORMED  Malin Cervini 11/17/2018, 8:43 AM

## 2018-11-18 ENCOUNTER — Inpatient Hospital Stay (HOSPITAL_COMMUNITY): Payer: Self-pay

## 2018-11-18 ENCOUNTER — Inpatient Hospital Stay (HOSPITAL_COMMUNITY): Payer: Self-pay | Admitting: Physical Therapy

## 2018-11-18 NOTE — Progress Notes (Signed)
Occupational Therapy Session Note  Patient Details  Name: Joseph Reeves MRN: [id number] Date of Birth: [birth year]  Today's Date: 11/18/2018 OT Individual Time: 5500-1642 OT Individual Time Calculation (min): 57 min    Short Term Goals: Week 1:  OT Short Term Goal 1 (Week 1): Pt will don lumbar corset with supervision. OT Short Term Goal 2 (Week 1): Pt will perform LB dressing with mod A overall. OT Short Term Goal 3 (Week 1): Pt will perform toilet transfer with max A of 1.  Skilled Therapeutic Interventions/Progress Updates:    1:1. Pt received in recliner reports need to toilet then shower. No pain indicated during session. Pt completes toilet transfer and 3/3 components of toileting in standing in stedy with S. Pt transfers via stedy into shower with S overall for bathing using cue out in padded TTB for peri care. Pt dresses EOB with s/u for shirt and brace and S/VC for LB dressing ueing figure 4 to reach feet and lateral leans to advance pants past hips. Exited session with pt seated in w/c, call light in reach and all needs met  Therapy Documentation Precautions:  Precautions Precautions: Back, Fall Precaution Booklet Issued: No Required Braces or Orthoses: Spinal Brace Spinal Brace: Lumbar corset, Applied in sitting position Restrictions Weight Bearing Restrictions: No General:   Vital Signs:  Pain:   ADL:   Vision   Perception    Praxis   Exercises:   Other Treatments:     Therapy/Group: Individual Therapy  Tonny Branch 11/18/2018, 3:37 PM

## 2018-11-18 NOTE — Progress Notes (Signed)
Occupational Therapy Session Note  Patient Details  Name: Joseph Reeves MRN: [id number] Date of Birth: 01-Aug-1965  Today's Date: 11/18/2018 OT Individual Time: 1000-1100 OT Individual Time Calculation (min): 60 min    Short Term Goals: Week 1:  OT Short Term Goal 1 (Week 1): Pt will don lumbar corset with supervision. OT Short Term Goal 2 (Week 1): Pt will perform LB dressing with mod A overall. OT Short Term Goal 3 (Week 1): Pt will perform toilet transfer with max A of 1.  Skilled Therapeutic Interventions/Progress Updates:    1:1. Pt received in recliner with no pain reported. Pt requesting to go to sink to cut hair. Pt uses stedy to trasnfer into w/c with S overall to replace cushion into w/c. Pt perches in stedy while at sink to increase core demands and WB through BLE while trimming hair and beard with S for sitting balance and VC for maintaining spinal precautions when reaching for objects at sink. Pt able to maintain perch position for >20 min prior to requesting seated rest in w/c. Pt reports "im sweating" after maintaining perch position using core muscles. Pt given inspection mirror to observe back of head and trim appropriately. Pt completes standing break in stedy for pressure relief. Exited session with pt seated at sink with RN aware of positioning and pt reporting to call for help prior to returning to chair.  Therapy Documentation Precautions:  Precautions Precautions: Back, Fall Precaution Booklet Issued: No Required Braces or Orthoses: Spinal Brace Spinal Brace: Lumbar corset, Applied in sitting position Restrictions Weight Bearing Restrictions: No General:   Vital Signs: Therapy Vitals Temp: 98.6 F (37 C) Temp Source: Oral Pulse Rate: 84 Resp: 20 BP: (!) 143/82 Patient Position (if appropriate): Lying Oxygen Therapy SpO2: 100 % O2 Device: Room Air Pain: Pain Assessment Pain Score: 0-No pain ADL:   Vision   Perception    Praxis    Exercises:   Other Treatments:     Therapy/Group: Individual Therapy  Tonny Branch 11/18/2018, 10:23 AM

## 2018-11-18 NOTE — Progress Notes (Signed)
Physical Therapy Session Note  Patient Details  Name: Joseph Reeves MRN: [id number] Date of Birth: 10-01-1965  Today's Date: 11/18/2018 PT Individual Time: 0810-0905 PT Individual Time Calculation (min): 55 min   Short Term Goals: Week 1:  PT Short Term Goal 1 (Week 1): Pt will complete least restrictive transfer with mod A x 1 PT Short Term Goal 2 (Week 1): Pt will initiate w/c mobility PT Short Term Goal 3 (Week 1): Pt will perform sit to stand with mod A to RW  Skilled Therapeutic Interventions/Progress Updates:   Pt received sitting in recliner and agreeable to PT. Sit<>stand in stedy to attempt urination. Unable to void. Transfer to [facility] with CGA.   WC mobility 4 x 274f with distant surpervision assist and min cues for safety in turns and awareness of WC parts.   Kinetron 5 x 1 min reciprocal marching min cues for full ROM on the R and symmetrical force through BLE. Short therapeutic rest breaks between bouts.   SB transfer to and from mat table with supervision assist and set up assist from PT to place SB appropriately. Pt attempted to place board prior to both transfers, but required assist to prevent from placing too far posteriorly.   Seated EOB LE strengthening.  Press ups with emphasis on BLE 2 x 10  Heel slides into knee flexion and extension, x 10 BLE with AROM on the R and manual resistance on the L.  Hip abduction x 12 BLE with light use of level 3 tband.  Calf raises x 15 BLE.  Ankle DF x 12 BLE.  Cues for full ROM and symmetry of movement intermittently throughout.   Patient returned to room and performed stedy transfer to recliner with CGA. Pt left sitting in recliner with call bell in reach and all needs met.          Therapy Documentation Precautions:  Precautions Precautions: Back, Fall Precaution Booklet Issued: No Required Braces or Orthoses: Spinal Brace Spinal Brace: Lumbar corset, Applied in sitting position Restrictions Weight Bearing  Restrictions: No Vital Signs: Therapy Vitals Temp: 98.6 F (37 C) Temp Source: Oral Pulse Rate: 84 Resp: 20 BP: (!) 143/82 Patient Position (if appropriate): Lying Oxygen Therapy SpO2: 100 % O2 Device: Room Air Pain: Pain Assessment Pain Scale: 0-10 Pain Score: 0-No pain Pain Type: Acute pain Pain Location: Back Pain Orientation: Mid;Lower Pain Descriptors / Indicators: Aching Pain Frequency: Intermittent Pain Onset: On-going Patients Stated Pain Goal: 3    Therapy/Group: Individual Therapy  ALorie Phenix10/31/2020, 9:08 AM

## 2018-11-18 NOTE — Progress Notes (Signed)
[facility] PHYSICAL MEDICINE & REHABILITATION PROGRESS NOTE   Subjective/Complaints:  Pt reports stayed in chair to sleep in overnight- did well.  Pain overall better, even at night when in chair. Trying to work RLE as much as possible    ROS: denies CP, SOB, N/V/D Objective:   No results found. No results for input(s): WBC, HGB, HCT, PLT in the last 72 hours. No results for input(s): NA, K, CL, CO2, GLUCOSE, BUN, CREATININE, CALCIUM in the last 72 hours.  Intake/Output Summary (Last 24 hours) at 11/18/2018 1137 Last data filed at 11/18/2018 0740 Gross per 24 hour  Intake 776 ml  Output 2225 ml  Net -1449 ml     Physical Exam: Vital Signs Blood pressure (!) 143/82, pulse 84, temperature 98.6 F (37 C), temperature source Oral, resp. rate 20, weight 100.5 kg, SpO2 100 %.  Physical Exam Nursing noteand vitalsreviewed. Constitutional:sitting up in bedside chair; ; appropriate, bright affect, NAD HENT:  Head:Normocephalicand atraumatic.  Nose:Nose normal.  Mouth/Throat: Nooropharyngeal exudate.  Eyes:.conjugate gaze Neck:Normal range of motion.Neck supple. Cardiovascular:  RRR  Respiratory: CTA B/L with no wheezes, rales or rhonchi heard OI:ZTIW, NT, ND, (+)BS Genitourinary: Genitourinary Comments: foley out Musculoskeletal:  General: No tendernessor edema.  Comments: UEs 5/5 in deltoids, biceps, WE, triceps, grip and finger abd B/L  LEs- RLE HF 2+/5, KE 4-/5, DF 2/5, PF 1/5, EHL 0/5 LLE HF 2/5 (slightly stronger than R), KE 3/5, DF 3/5, PF 2/5, EHL 2/5  Neurological: He is alertand oriented to person, place, and time. Nocranial nerve deficit. Patient is alert . Follows commands. Oriented x3. Decreased sensation ONLY at R S1 on exam- tested dermatomes on arms, torso and legs- otherwise intact everywhere else. Flaccid partial paralysis Skin: Back incision is dressed Psych: less anxious- brighter   Assessment/Plan: 1.  Functional deficits secondary to cauda equina syndrome  which require 3+ hours per day of interdisciplinary therapy in a comprehensive inpatient rehab setting.  Physiatrist is providing close team supervision and 24 hour management of active medical problems listed below.  Physiatrist and rehab team continue to assess barriers to discharge/monitor patient progress toward functional and medical goals  Care Tool:  Bathing    Body parts bathed by patient: Right arm, Left arm, Chest, Abdomen, Front perineal area, Right upper leg, Left upper leg, Face, Buttocks, Right lower leg, Left lower leg   Body parts bathed by helper: Buttocks     Bathing assist Assist Level: Supervision/Verbal cueing(LH SPonge)     Upper Body Dressing/Undressing Upper body dressing   What is the patient wearing?: Pull over shirt, Orthosis Orthosis activity level: Performed by patient  Upper body assist Assist Level: Set up assist    Lower Body Dressing/Undressing Lower body dressing      What is the patient wearing?: Pants     Lower body assist Assist for lower body dressing: Minimal Assistance - Patient > 75%     Toileting Toileting    Toileting assist Assist for toileting: Contact Guard/Touching assist     Transfers Chair/bed transfer  Transfers assist  Chair/bed transfer activity did not occur: Safety/medical concerns  Chair/bed transfer assist level: Dependent - mechanical lift     Locomotion Ambulation   Ambulation assist   Ambulation activity did not occur: Safety/medical concerns  Assist level: 2 helpers Assistive device: Parallel bars Max distance: 5'   Walk 10 feet activity   Assist  Walk 10 feet activity did not occur: Safety/medical concerns  Walk 50 feet activity   Assist Walk 50 feet with 2 turns activity did not occur: Safety/medical concerns         Walk 150 feet activity   Assist Walk 150 feet activity did not occur: Safety/medical concerns          Walk 10 feet on uneven surface  activity   Assist Walk 10 feet on uneven surfaces activity did not occur: Safety/medical concerns         Wheelchair     Assist Will patient use wheelchair at discharge?: Yes Type of Wheelchair: Manual Wheelchair activity did not occur: Safety/medical concerns  Wheelchair assist level: Supervision/Verbal cueing Max wheelchair distance: 150'    Wheelchair 50 feet with 2 turns activity    Assist    Wheelchair 50 feet with 2 turns activity did not occur: Safety/medical concerns   Assist Level: Supervision/Verbal cueing   Wheelchair 150 feet activity     Assist  Wheelchair 150 feet activity did not occur: Safety/medical concerns   Assist Level: Supervision/Verbal cueing   Blood pressure (!) 143/82, pulse 84, temperature 98.6 F (37 C), temperature source Oral, resp. rate 20, weight 100.5 kg, SpO2 100 %.  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  10/31- some improvement in RLE noted 2. Antithrombotics: -DVT/anticoagulation:SCDs. Check vascular study 10/28- has peroneal/post tibial DVT- since below knee, will start Lovenox 40 mg daily x 90 days from surgery; will recheck Dopplers next week -antiplatelet therapy: N/A 3. Pain Management:Hydrocodone as needed  10/28- Start Gabapentin 300 mg TID- hopefully will kick in soon, Baclofen 5 mg TID and con't Norco prn  10/29- pain better controlled- will increase nighttime Baclofen to 10 mg 4. Mood:Provide emotional support -antipsychotic agents: N/A 5. Neuropsych: This patientiscapable of making decisions on hisown behalf. 6. Skin/Wound Care:Routine skin checks 7. Fluids/Electrolytes/Nutrition:Routine in and outs with follow-up chemistries 8. Neurogenicbowel andbladder. Need to discuss  removing Foley tube and begin voiding trial- however since he had >1L of volume in bladder and bladder has been stretched, Urology usually asks [place] to wait ~ 1 week of bladder rest to remove foley- will discuss starting Flomax to maximize potential of pt voiding after foley removed. - pt likely has neurogenic bowel and is during spinal shock- gut doesn't work during spinal shock and needs laxatives daily, as well as suppository nightly as part of bowel program- if pt unable to go on his own today, will put in for Suppository (enemas usually don't work in cauda equina syndrome patients) for this evening.  10/27- pt cleaned out last night- went without suppository- might not have full neurogenic bowel  10/28- are in/out cathing- will order lidocaine jelly prn; started flomax last night; hopefully will be able to void.  10/29- voiding some- not emptying yet- con't Flomax  10/30- emptying and voiding and having BMs- continent- no caths required in 24+ hrs. Will see if can stop Flomax last week of rehab. 9. Postoperative anemia. Follow-up CBC 10. SCI education- will educate pt on SCI issues during inpt rehab. Will likely need Rehab physician in [place], [place] and in [place] in future.      LOS: 5 days A FACE TO FACE EVALUATION WAS PERFORMED  Shanina Kepple 11/18/2018, 11:37 AM

## 2018-11-18 NOTE — Plan of Care (Signed)
  Problem: Consults Goal: RH GENERAL PATIENT EDUCATION Description: See Patient Education module for education specifics. Outcome: Progressing Goal: Skin Care Protocol Initiated - if Braden Score 18 or less Description: If consults are not indicated, leave blank or document N/A Outcome: Progressing   Problem: RH BOWEL ELIMINATION Goal: RH STG MANAGE BOWEL WITH ASSISTANCE Description: STG Manage Bowel with mod I Assistance. Outcome: Progressing Goal: RH STG MANAGE BOWEL W/MEDICATION W/ASSISTANCE Description: STG Manage Bowel with Medication with mod I Assistance. Outcome: Progressing   Problem: RH BLADDER ELIMINATION Goal: RH STG MANAGE BLADDER WITH ASSISTANCE Description: STG Manage Bladder With min Assistance Outcome: Progressing Goal: RH STG MANAGE BLADDER WITH EQUIPMENT WITH ASSISTANCE Description: STG Manage Bladder With Equipment With min Assistance Outcome: Progressing   Problem: RH SAFETY Goal: RH STG ADHERE TO SAFETY PRECAUTIONS W/ASSISTANCE/DEVICE Description: STG Adhere to Safety Precautions With supervision Assistance/Device. Outcome: Progressing   Problem: RH PAIN MANAGEMENT Goal: RH STG PAIN MANAGED AT OR BELOW PT'S PAIN GOAL Description: Less than 4 on 0-10 scale Outcome: Progressing   Problem: RH KNOWLEDGE DEFICIT GENERAL Goal: RH STG INCREASE KNOWLEDGE OF SELF CARE AFTER HOSPITALIZATION Description: Pt will identify safety and spinal precautions along with medication compliance to manage bowel function prior to DC  Outcome: Progressing   

## 2018-11-19 ENCOUNTER — Inpatient Hospital Stay (HOSPITAL_COMMUNITY): Payer: Self-pay | Admitting: Occupational Therapy

## 2018-11-19 NOTE — Progress Notes (Signed)
Occupational Therapy Session Note  Patient Details  Name: Joseph Reeves MRN: [id number] Date of Birth: 30-Jun-1965  Today's Date: 11/19/2018 OT Individual Time: 1100-1200 OT Individual Time Calculation (min): 60 min    Short Term Goals: Week 1:  OT Short Term Goal 1 (Week 1): Pt will don lumbar corset with supervision. OT Short Term Goal 2 (Week 1): Pt will perform LB dressing with mod A overall. OT Short Term Goal 3 (Week 1): Pt will perform toilet transfer with max A of 1.  Skilled Therapeutic Interventions/Progress Updates:    Patient seated in recliner, pleasant and ready for therapy session.  Completed sit to stand in stedy multiple times during session with CS.  Had to change shorts due to wetness from either urinal or washing after voiding - he is able to pull pants up/down in stedy with CGA/CS, pants over feet with CS.  stedy transfer to w/c.   Patient is able to propel w/c to and from therapy gym, set up w/c for transfer including leg rests and complete SB transfer to/from mat table with CS.  He tolerated unsupported sitting for 40 minutes of session with UB and LB conditioning and strengthening activities, core mobility and seated balance with excellent effort and improved control of bilateral LEs noted.  He returned to recliner at close of session via stedy, sister present and all items in reach.    Therapy Documentation Precautions:  Precautions Precautions: Back, Fall Precaution Booklet Issued: No Required Braces or Orthoses: Spinal Brace Spinal Brace: Lumbar corset, Applied in sitting position Restrictions Weight Bearing Restrictions: No General:   Vital Signs:  Pain: Pain Assessment Pain Scale: 0-10 Pain Score: 0-No pain Therapy/Group: Individual Therapy  Carlos Levering 11/19/2018, 12:22 PM

## 2018-11-19 NOTE — Plan of Care (Signed)
  Problem: Consults Goal: RH GENERAL PATIENT EDUCATION Description: See Patient Education module for education specifics. Outcome: Progressing Goal: Skin Care Protocol Initiated - if Braden Score 18 or less Description: If consults are not indicated, leave blank or document N/A Outcome: Progressing   Problem: RH BOWEL ELIMINATION Goal: RH STG MANAGE BOWEL WITH ASSISTANCE Description: STG Manage Bowel with mod I Assistance. Outcome: Progressing Goal: RH STG MANAGE BOWEL W/MEDICATION W/ASSISTANCE Description: STG Manage Bowel with Medication with mod I Assistance. Outcome: Progressing   Problem: RH BLADDER ELIMINATION Goal: RH STG MANAGE BLADDER WITH ASSISTANCE Description: STG Manage Bladder With min Assistance Outcome: Progressing Goal: RH STG MANAGE BLADDER WITH EQUIPMENT WITH ASSISTANCE Description: STG Manage Bladder With Equipment With min Assistance Outcome: Progressing   Problem: RH SAFETY Goal: RH STG ADHERE TO SAFETY PRECAUTIONS W/ASSISTANCE/DEVICE Description: STG Adhere to Safety Precautions With supervision Assistance/Device. Outcome: Progressing   Problem: RH PAIN MANAGEMENT Goal: RH STG PAIN MANAGED AT OR BELOW PT'S PAIN GOAL Description: Less than 4 on 0-10 scale Outcome: Progressing   Problem: RH KNOWLEDGE DEFICIT GENERAL Goal: RH STG INCREASE KNOWLEDGE OF SELF CARE AFTER HOSPITALIZATION Description: Pt will identify safety and spinal precautions along with medication compliance to manage bowel function prior to DC  Outcome: Progressing   

## 2018-11-19 NOTE — Progress Notes (Signed)
[place] PHYSICAL MEDICINE & REHABILITATION PROGRESS NOTE   Subjective/Complaints:  Pt reports tingling on both of feet on the bottom- doesn't impair sensation but is new.  Sister from [place] is here- Avel Sensor Voiding and having BMs.    ROS: denies CP, SOB, N/V/D Objective:   No results found. No results for input(s): WBC, HGB, HCT, PLT in the last 72 hours. No results for input(s): NA, K, CL, CO2, GLUCOSE, BUN, CREATININE, CALCIUM in the last 72 hours.  Intake/Output Summary (Last 24 hours) at 11/19/2018 1152 Last data filed at 11/19/2018 1124 Gross per 24 hour  Intake 1019 ml  Output 3101 ml  Net -2082 ml     Physical Exam: Vital Signs Blood pressure (!) 143/87, pulse 84, temperature 98.3 F (36.8 C), resp. rate 20, weight 100.5 kg, SpO2 100 %.  Physical Exam Nursing noteand vitalsreviewed. Constitutional:sitting up in bedside chair; sister at bedside ; appropriate, bright affect, NAD HENT:  Head:Normocephalicand atraumatic.  Nose:Nose normal.  Mouth/Throat: Nooropharyngeal exudate.  Eyes:.conjugate gaze Neck:Normal range of motion.Neck supple. Cardiovascular:  RRR  Respiratory: CTA B/L with no wheezes, rales or rhonchi heardstill XA:JOIN, NT, ND, (+)BS Musculoskeletal:  General: No tendernessor edema.  Comments: UEs 5/5 in deltoids, biceps, WE, triceps, grip and finger abd B/L  LEs- RLE HF 2+/5, KE 4-/5, DF 2/5, PF 1/5, EHL 0/5 LLE HF 2/5 (slightly stronger than R), KE 3/5, DF 3/5, PF 2/5, EHL 2/5  Neurological: He is alertand oriented to person, place, and time. Nocranial nerve deficit. Patient is alert . Follows commands. Oriented x3. Decreased sensation ONLY at R S1 on exam- tested dermatomes on arms, torso and legs- otherwise intact everywhere else. Flaccid partial paralysis Skin: Back incision is dressed Psych: bright affect   Assessment/Plan: 1. Functional deficits secondary to cauda equina syndrome   which require 3+ hours per day of interdisciplinary therapy in a comprehensive inpatient rehab setting.  Physiatrist is providing close team supervision and 24 hour management of active medical problems listed below.  Physiatrist and rehab team continue to assess barriers to discharge/monitor patient progress toward functional and medical goals  Care Tool:  Bathing    Body parts bathed by patient: Right arm, Left arm, Chest, Abdomen, Front perineal area, Right upper leg, Left upper leg, Face, Buttocks, Right lower leg, Left lower leg   Body parts bathed by helper: Buttocks     Bathing assist Assist Level: Supervision/Verbal cueing     Upper Body Dressing/Undressing Upper body dressing   What is the patient wearing?: Pull over shirt, Orthosis Orthosis activity level: Performed by patient  Upper body assist Assist Level: Set up assist    Lower Body Dressing/Undressing Lower body dressing      What is the patient wearing?: Pants     Lower body assist Assist for lower body dressing: Supervision/Verbal cueing     Toileting Toileting    Toileting assist Assist for toileting: Supervision/Verbal cueing     Transfers Chair/bed transfer  Transfers assist  Chair/bed transfer activity did not occur: Safety/medical concerns  Chair/bed transfer assist level: Dependent - mechanical lift     Locomotion Ambulation   Ambulation assist   Ambulation activity did not occur: Safety/medical concerns  Assist level: 2 helpers Assistive device: Parallel bars Max distance: 5'   Walk 10 feet activity   Assist  Walk 10 feet activity did not occur: Safety/medical concerns        Walk 50 feet activity   Assist Walk 50 feet with 2 turns  activity did not occur: Safety/medical concerns         Walk 150 feet activity   Assist Walk 150 feet activity did not occur: Safety/medical concerns         Walk 10 feet on uneven surface  activity   Assist Walk 10 feet on  uneven surfaces activity did not occur: Safety/medical concerns         Wheelchair     Assist Will patient use wheelchair at discharge?: Yes Type of Wheelchair: Manual Wheelchair activity did not occur: Safety/medical concerns  Wheelchair assist level: Supervision/Verbal cueing Max wheelchair distance: 150'    Wheelchair 50 feet with 2 turns activity    Assist    Wheelchair 50 feet with 2 turns activity did not occur: Safety/medical concerns   Assist Level: Supervision/Verbal cueing   Wheelchair 150 feet activity     Assist  Wheelchair 150 feet activity did not occur: Safety/medical concerns   Assist Level: Supervision/Verbal cueing   Blood pressure (!) 143/87, pulse 84, temperature 98.3 F (36.8 C), resp. rate 20, weight 100.5 kg, SpO2 100 %.  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  10/31- some improvement in RLE noted 2. Antithrombotics: -DVT/anticoagulation:SCDs. Check vascular study 10/28- has peroneal/post tibial DVT- since below knee, will start Lovenox 40 mg daily x 90 days from surgery; will recheck Dopplers next week -antiplatelet therapy: N/A 3. Pain Management:Hydrocodone as needed  10/28- Start Gabapentin 300 mg TID- hopefully will kick in soon, Baclofen 5 mg TID and con't Norco prn  10/29- pain better controlled- will increase nighttime Baclofen to 10 mg 4. Mood:Provide emotional support -antipsychotic agents: N/A 5. Neuropsych: This patientiscapable of making decisions on hisown behalf. 6. Skin/Wound Care:Routine skin checks 7. Fluids/Electrolytes/Nutrition:Routine in and outs with follow-up chemistries 8. Neurogenicbowel andbladder. Need to discuss removing Foley tube and begin voiding trial- however since he had >1L of volume in bladder and bladder has  been stretched, Urology usually asks [place] to wait ~ 1 week of bladder rest to remove foley- will discuss starting Flomax to maximize potential of pt voiding after foley removed. - pt likely has neurogenic bowel and is during spinal shock- gut doesn't work during spinal shock and needs laxatives daily, as well as suppository nightly as part of bowel program- if pt unable to go on his own today, will put in for Suppository (enemas usually don't work in cauda equina syndrome patients) for this evening.  10/27- pt cleaned out last night- went without suppository- might not have full neurogenic bowel  10/28- are in/out cathing- will order lidocaine jelly prn; started flomax last night; hopefully will be able to void.  10/29- voiding some- not emptying yet- con't Flomax  10/30- emptying and voiding and having BMs- continent- no caths required in 24+ hrs. Will see if can stop Flomax last week of rehab. 9. Postoperative anemia. Follow-up CBC 10. SCI education- will educate pt on SCI issues during inpt rehab. Will likely need Rehab physician in [place], [place] and in [place] in future. 11. High BP- running 140s/80s- on no meds- said he was on no meds prior to admission- will monitor- could be pain; if doesn't improve, will start low dose meds.    LOS: 6 days A FACE TO FACE EVALUATION WAS PERFORMED  Elexius Minar 11/19/2018, 11:52 AM

## 2018-11-19 NOTE — Progress Notes (Signed)
Patient's sister, Broderick Fonseca, is the designated visitor. Visitor information given to the patient this morning. She came from [place] and will be staying for the night. She was screened at the hospital's lobby before coming to the unit. Anderson Malta was notified and approved.

## 2018-11-20 ENCOUNTER — Inpatient Hospital Stay (HOSPITAL_COMMUNITY): Payer: Self-pay | Admitting: Occupational Therapy

## 2018-11-20 ENCOUNTER — Inpatient Hospital Stay (HOSPITAL_COMMUNITY): Payer: Self-pay | Admitting: Physical Therapy

## 2018-11-20 LAB — CBC WITH DIFFERENTIAL/PLATELET
Abs Immature Granulocytes: 0.04 10*3/uL (ref 0.00–0.07)
Basophils Absolute: 0.1 10*3/uL (ref 0.0–0.1)
Basophils Relative: 1 %
Eosinophils Absolute: 0.2 10*3/uL (ref 0.0–0.5)
Eosinophils Relative: 4 %
HCT: 33.4 % — ABNORMAL LOW (ref 39.0–52.0)
Hemoglobin: 10.8 g/dL — ABNORMAL LOW (ref 13.0–17.0)
Immature Granulocytes: 1 %
Lymphocytes Relative: 21 %
Lymphs Abs: 1.3 10*3/uL (ref 0.7–4.0)
MCH: 31.4 pg (ref 26.0–34.0)
MCHC: 32.3 g/dL (ref 30.0–36.0)
MCV: 97.1 fL (ref 80.0–100.0)
Monocytes Absolute: 0.7 10*3/uL (ref 0.1–1.0)
Monocytes Relative: 11 %
Neutro Abs: 4 10*3/uL (ref 1.7–7.7)
Neutrophils Relative %: 62 %
Platelets: 368 10*3/uL (ref 150–400)
RBC: 3.44 MIL/uL — ABNORMAL LOW (ref 4.22–5.81)
RDW: 12 % (ref 11.5–15.5)
WBC: 6.3 10*3/uL (ref 4.0–10.5)
nRBC: 0 % (ref 0.0–0.2)

## 2018-11-20 LAB — COMPREHENSIVE METABOLIC PANEL
ALT: 40 U/L (ref 0–44)
AST: 23 U/L (ref 15–41)
Albumin: 3.3 g/dL — ABNORMAL LOW (ref 3.5–5.0)
Alkaline Phosphatase: 43 U/L (ref 38–126)
Anion gap: 8 (ref 5–15)
BUN: 18 mg/dL (ref 6–20)
CO2: 29 mmol/L (ref 22–32)
Calcium: 9.3 mg/dL (ref 8.9–10.3)
Chloride: 103 mmol/L (ref 98–111)
Creatinine, Ser: 1.17 mg/dL (ref 0.61–1.24)
GFR calc Af Amer: >60 mL/min (ref >60)
GFR calc non Af Amer: >60 mL/min (ref >60)
Glucose, Bld: 124 mg/dL — ABNORMAL HIGH (ref 70–99)
Potassium: 4.5 mmol/L (ref 3.5–5.1)
Sodium: 140 mmol/L (ref 135–145)
Total Bilirubin: 0.5 mg/dL (ref 0.3–1.2)
Total Protein: 6.4 g/dL — ABNORMAL LOW (ref 6.5–8.1)

## 2018-11-20 NOTE — Progress Notes (Signed)
Physical Therapy Session Note  Patient Details  Name: Joseph Reeves MRN: [id number] Date of Birth: [birth year]  Today's Date: 11/20/2018 PT Individual Time: 0800-0900; 8502-7741 PT Individual Time Calculation (min): 60 min and 85 min  Short Term Goals: Week 1:  PT Short Term Goal 1 (Week 1): Pt will complete least restrictive transfer with mod A x 1 PT Short Term Goal 2 (Week 1): Pt will initiate w/c mobility PT Short Term Goal 3 (Week 1): Pt will perform sit to stand with mod A to RW  Skilled Therapeutic Interventions/Progress Updates:    Session 1: Pt received seated in recliner in room, agreeable to PT session. No complaints of pain this AM. Stedy transfer recliner to w/c, CGA to stand to stedy. Manual w/c propulsion 2 x 200 ft with setup A for management of w/c parts. Close Supervision for slide board transfer w/c to/from mat table. Sit to supine mod A for BLE management. Supine BLE strengthening therex with AAROM: heel slides, hip abd, SKFO, quad sets x 10 reps each with focus on breathing techniques with performing therex. Supine to sit with mod A for RLE management and trunk control with v/c for log rolling technique. Seated RLE gastroc stretch 3 x 30 sec each, demonstrated how to perform stretch with sheet. Slide board transfer back to w/c with close Supervision. Manual w/c propulsion back to room, setup A for management of parts. Stedy transfer back to recliner. Pt left seated in recliner in room with needs in reach at end of session.  Session 2: Pt received seated in recliner, agreeable to PT session. No complaints of pain. Stedy transfer recliner to/from w/c, sit to stand with CGA to stedy. Manual w/c propulsion 2 x 150 ft with use of BUE and setup A. Squat pivot transfer w/c to/from mat table with Supervision. Sit to stand with CGA to // bars. Session focus on standing via use of [place] in // bars for safety. Use of mirror for visual feedback for upright posture with focus on R  knee extension and hip extension in stance as well as decreased BUE support. Pt continues to rely heavily on BUE in standing and stand in flexed posture. Pt is able to perform R knee extension but unable to maintain in stance. Standing alt L/R 1" step taps with manual assist to block R knee, 2 x 10 reps. Standing mini-squats 2 x 5 reps with focus on use of BLE to return to full stand vs BUE. Alt L/R forward/backward stepping with manual assist to block R knee in stance. Pt fatigues quickly with standing activities, again focused on breathing techniques with exertion. Pt exhibits RLE edema > LLE, assisted with donning BLE TEDs. Discussed pt's home setup, he plans on d/c to sister's house initially for ~1 week before returning home to [place]. Per pt he does not have stairs as his sister's house but does at his house, recommending having a ramp installed for entry into his home. Also discussed that pt will likely d/c home at w/c level. Pt understanding of education, will continue to educate during his stay on rehab. Pt left seated in recliner in room with needs in reach at end of session.   Therapy Documentation Precautions:  Precautions Precautions: Back, Fall Precaution Booklet Issued: No Required Braces or Orthoses: Spinal Brace Spinal Brace: Lumbar corset, Applied in sitting position Restrictions Weight Bearing Restrictions: No    Therapy/Group: Individual Therapy   Excell Seltzer, PT, DPT  11/20/2018, 12:04 PM

## 2018-11-20 NOTE — Progress Notes (Signed)
Orthopedic Tech Progress Note Patient Details:  Joseph Reeves Aug 27, 1965 [id number] Called in order to [employer] for a PRAFO BOOT Patient ID: Joseph Reeves, male   DOB: 1965-12-10, 53 y.o.   MRN: [id number]   Joseph Reeves 11/20/2018, 3:20 PM

## 2018-11-20 NOTE — Progress Notes (Signed)
Occupational Therapy Session Note  Patient Details  Name: Joseph Reeves MRN: [id number] Date of Birth: 03-Jun-1965  Today's Date: 11/20/2018 OT Individual Time: 7494-4967 OT Individual Time Calculation (min): 60 min    Short Term Goals: Week 1:  OT Short Term Goal 1 (Week 1): Pt will don lumbar corset with supervision. OT Short Term Goal 2 (Week 1): Pt will perform LB dressing with mod A overall. OT Short Term Goal 3 (Week 1): Pt will perform toilet transfer with max A of 1.  Skilled Therapeutic Interventions/Progress Updates:    Pt seen for OT ADL bathing/dressing session. Pt sitting up in recliner upon arrival, denied pain and agreeable to tx session. STEDY utilized throughout session for functional transfers, close supervision to power into standing in STEDY. He bathed seated on padded tub bench with cut-out with set-up assist, use of LH sponge to wash LEs and using features of tub bench cut out to complete pericare/buttock hygiene.  He returned to w/c to dress, set-up UB dressing with pt able to don LSO. He threaded B LEs into pants, did not use AE. Discussed various modified methods for pulling pants up, including lateral leans onto bed or while sitting EOB. Pt declined these methods, wanting to stand in STEDY to complete. Guarding assist while pt stood in STEDY and pulled pants up, alternating UE support on bar of STEDY.  Grooming tasks completed mod I from w/c level. Introduced squat pivot transfers and education/demonstration provided for technique. Completed squat pivot w/c <> EOB with guarding assist and VCs for technique. Pt transitioned back to recliner at end of session, left seated with all needs in reach  Therapy Documentation Precautions:  Precautions Precautions: Back, Fall Precaution Booklet Issued: No Required Braces or Orthoses: Spinal Brace Spinal Brace: Lumbar corset, Applied in sitting position Restrictions Weight Bearing Restrictions: No   Therapy/Group:  Individual Therapy  Indya Oliveria L 11/20/2018, 7:02 AM

## 2018-11-20 NOTE — Progress Notes (Signed)
[place] PHYSICAL MEDICINE & REHABILITATION PROGRESS NOTE   Subjective/Complaints:  Mr. Koy has no complaints this morning. He is in a pleasant mood. He is working with OT and will be using the commode.   ROS: denies CP, SOB, N/V/D Objective:   No results found. Recent Labs    11/20/18 0452  WBC 6.3  HGB 10.8*  HCT 33.4*  PLT 368   Recent Labs    11/20/18 0452  NA 140  K 4.5  CL 103  CO2 29  GLUCOSE 124*  BUN 18  CREATININE 1.17  CALCIUM 9.3    Intake/Output Summary (Last 24 hours) at 11/20/2018 1039 Last data filed at 11/20/2018 0724 Gross per 24 hour  Intake 444 ml  Output 2700 ml  Net -2256 ml     Physical Exam: Vital Signs Blood pressure 135/75, pulse 79, temperature 98.8 F (37.1 C), temperature source Oral, resp. rate 18, weight 100.5 kg, SpO2 98 %.  Physical Exam Nursing noteand vitalsreviewed. Constitutional:sitting up, appropriate and pleasant, bright affect, NAD HENT:  Head:Normocephalicand atraumatic.  Nose:Nose normal.  Mouth/Throat: Nooropharyngeal exudate.  Eyes:.conjugate gaze Neck:Normal range of motion.Neck supple. Cardiovascular:  RRR  Respiratory: CTA B/L with no wheezes, rales or rhonchi heardstill TG:GYIR, NT, ND, (+)BS Musculoskeletal:  General: No tendernessor edema.  Comments: UEs 5/5 in deltoids, biceps, WE, triceps, grip and finger abd B/L  LEs- RLE HF 2+/5, KE 4-/5, DF 2/5, PF 1/5, EHL 0/5 LLE HF 2/5 (slightly stronger than R), KE 3/5, DF 3/5, PF 2/5, EHL 2/5  Neurological: He is alertand oriented to person, place, and time. Nocranial nerve deficit. Patient is alert . Follows commands. Oriented x3. Decreased sensation ONLY at R S1 on exam- tested dermatomes on arms, torso and legs- otherwise intact everywhere else. Flaccid partial paralysis Skin: Back incision is dressed Psych: bright affect   Assessment/Plan: 1. Functional deficits secondary to cauda equina syndrome  which  require 3+ hours per day of interdisciplinary therapy in a comprehensive inpatient rehab setting.  Physiatrist is providing close team supervision and 24 hour management of active medical problems listed below.  Physiatrist and rehab team continue to assess barriers to discharge/monitor patient progress toward functional and medical goals  Care Tool:  Bathing    Body parts bathed by patient: Right arm, Left arm, Chest, Abdomen, Front perineal area, Right upper leg, Left upper leg, Face, Buttocks, Right lower leg, Left lower leg   Body parts bathed by helper: Buttocks     Bathing assist Assist Level: Supervision/Verbal cueing     Upper Body Dressing/Undressing Upper body dressing   What is the patient wearing?: Pull over shirt, Orthosis Orthosis activity level: Performed by patient  Upper body assist Assist Level: Set up assist    Lower Body Dressing/Undressing Lower body dressing      What is the patient wearing?: Pants     Lower body assist Assist for lower body dressing: Contact Guard/Touching assist     Toileting Toileting    Toileting assist Assist for toileting: Supervision/Verbal cueing     Transfers Chair/bed transfer  Transfers assist  Chair/bed transfer activity did not occur: Safety/medical concerns  Chair/bed transfer assist level: Dependent - mechanical lift     Locomotion Ambulation   Ambulation assist   Ambulation activity did not occur: Safety/medical concerns  Assist level: 2 helpers Assistive device: Parallel bars Max distance: 5'   Walk 10 feet activity   Assist  Walk 10 feet activity did not occur: Safety/medical concerns  Walk 50 feet activity   Assist Walk 50 feet with 2 turns activity did not occur: Safety/medical concerns         Walk 150 feet activity   Assist Walk 150 feet activity did not occur: Safety/medical concerns         Walk 10 feet on uneven surface  activity   Assist Walk 10 feet on  uneven surfaces activity did not occur: Safety/medical concerns         Wheelchair     Assist Will patient use wheelchair at discharge?: Yes Type of Wheelchair: Manual Wheelchair activity did not occur: Safety/medical concerns  Wheelchair assist level: Supervision/Verbal cueing Max wheelchair distance: 150'    Wheelchair 50 feet with 2 turns activity    Assist    Wheelchair 50 feet with 2 turns activity did not occur: Safety/medical concerns   Assist Level: Supervision/Verbal cueing   Wheelchair 150 feet activity     Assist  Wheelchair 150 feet activity did not occur: Safety/medical concerns   Assist Level: Supervision/Verbal cueing   Blood pressure 135/75, pulse 79, temperature 98.8 F (37.1 C), temperature source Oral, resp. rate 18, weight 100.5 kg, SpO2 98 %.  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  10/31- some improvement in RLE noted 2. Antithrombotics: -DVT/anticoagulation:SCDs. Check vascular study 10/28- has peroneal/post tibial DVT- since below knee, will start Lovenox 40 mg daily x 90 days from surgery; will recheck Dopplers next week -antiplatelet therapy: N/A 3. Pain Management:Hydrocodone as needed  10/28- Start Gabapentin 300 mg TID- hopefully will kick in soon, Baclofen 5 mg TID and con't Norco prn  10/29- pain better controlled- will increase nighttime Baclofen to 10 mg 4. Mood:Provide emotional support -antipsychotic agents: N/A 5. Neuropsych: This patientiscapable of making decisions on hisown behalf. 6. Skin/Wound Care:Routine skin checks 7. Fluids/Electrolytes/Nutrition:Routine in and outs with follow-up chemistries 8. Neurogenicbowel andbladder. Need to discuss removing Foley tube and begin voiding trial- however since he had >1L of volume in  bladder and bladder has been stretched, Urology usually asks us to wait ~ 1 week of bladder rest to remove foley- will discuss starting Flomax to maximize potential of pt voiding after foley removed. - pt likely has neurogenic bowel and is during spinal shock- gut doesn't work during spinal shock and needs laxatives daily, as well as suppository nightly as part of bowel program- if pt unable to go on his own today, will put in for Suppository (enemas usually don't work in cauda equina syndrome patients) for this evening.  10/27- pt cleaned out last night- went without suppository- might not have full neurogenic bowel  10/28- are in/out cathing- will order lidocaine jelly prn; started flomax last night; hopefully will be able to void.  10/29- voiding some- not emptying yet- con't Flomax  10/30- emptying and voiding and having BMs- continent- no caths required in 24+ hrs. Will see if can stop Flomax last week of rehab. 9. Postoperative anemia. CBC from 11/2 shows  Hgb of 10.8. 10. SCI education- will educate pt on SCI issues during inpt rehab. [place] and in [place] in future. 11. High BP- running 140s/80s- on no meds- said he was on no meds prior to admission- will monitor- could be pain; if doesn't improve, will start low dose meds.    LOS: 7 days A FACE TO FACE EVALUATION WAS PERFORMED  Clint BolderKrutika  P Paulkar 11/20/2018, 10:39 AM

## 2018-11-21 ENCOUNTER — Inpatient Hospital Stay (HOSPITAL_COMMUNITY): Payer: Self-pay | Admitting: Occupational Therapy

## 2018-11-21 ENCOUNTER — Inpatient Hospital Stay (HOSPITAL_COMMUNITY): Payer: Self-pay | Admitting: Physical Therapy

## 2018-11-21 MED ORDER — GABAPENTIN 300 MG PO CAPS
600.0000 mg | ORAL_CAPSULE | Freq: Two times a day (BID) | ORAL | Status: DC
  Administered 2018-11-21 – 2018-11-30 (×18): 600 mg via ORAL
  Filled 2018-11-21 (×19): qty 2

## 2018-11-21 NOTE — Patient Care Conference (Signed)
Inpatient RehabilitationTeam Conference and Plan of Care Update Date: 11/21/2018   Time: 11:15 AM    Patient Name: Joseph Reeves      Medical Record Number: [id number]  Date of Birth: 12/23/1965 Sex: Male         Room/Bed: 4M08C/4M08C-01 Payor Info: Payor: MEDICAID POTENTIAL / Plan: MEDICAID POTENTIAL / Product Type: *No Product type* /    Admit Date/Time:  11/13/2018  7:26 PM  Primary Diagnosis:  Cauda equina syndrome [facility])  Patient Active Problem List   Diagnosis Date Noted  . Cauda equina syndrome ([place]) 11/13/2018  . Cauda equina syndrome with neurogenic bladder ([place])   . Neurogenic bladder, flaccid   . Neurogenic bowel   . Leg weakness 11/07/2018  . Numbness and tingling of both legs 11/07/2018    Expected Discharge Date: Expected Discharge Date: 11/30/18  Team Members Present: Physician leading conference: Dr. [name] Social Worker Present: Lennart Pall, LCSW Nurse Present: Dwaine Gale, RN Case Manager: Karene Fry, RN PT Present: Excell Seltzer, PT OT Present: Amy Rounds, OT SLP Present: Jettie Booze, CF-SLP PPS Coordinator present : Gunnar Fusi, Novella Olive, PT     Current Status/Progress Goal Weekly Team Focus  Bowel/Bladder   Continent of bowel, lbm 11/2, patient voiding on his own. o  To continue voiding independently.  Continue to monitor and assess Q shift.   Swallow/Nutrition/ Hydration             ADL's   Supervision sliding board or squat pivot transfers, steadying assist toileting, set-up/supervision bathing using AE, min A LB dressing, indep UB dressing  Mod I overall  ADL re-training, functional transfers, SCI education, family education and d/c plannning   Mobility   mod A bed mobility, Supervision SB vs squat pivot transfer, CGA to stand to stedy or in // bars, Supervision w/c mobility, gait assist x 2 in // bars x 5'  S to mod I at w/c level  LE NMR R>L, transfers, SCI Pharmacist, community  Observations            Pain   Patient c/o of back pain, groin, r leg  2/10  Continue to assess and monitor q shift, give pain meds as ordered.   Skin   Incision on back.  Remain free from infection and skin break down.  Assess and monitor q shift.    Rehab Goals Patient on target to meet rehab goals: Yes *See Care Plan and progress notes for long and short-term goals.     Barriers to Discharge  Current Status/Progress Possible Resolutions Date Resolved   Nursing  Medical stability;Home environment access/layout;Neurogenic Bowel & Bladder;Decreased caregiver support               PT  Decreased caregiver support;Medical stability;Home environment access/layout;Neurogenic Bowel & Bladder  his sister's home is one story; his home has STE and bedrooms are on 2nd story              OT Home environment access/layout                SLP                SW                Discharge Planning/Teaching Needs:  Pt to d/c initially to sister's home in [place] then return to his home in [place].  Teaching needs TBD and  planned ed closer to d/c.   Team Discussion: Tingling RLE, increased Gabapentin, incision looks good, voiding, very motivated.  Pain controlled with vicodin.  OT S slideboard/squat pivot transfers, has mod I goals.  PT S/min bed, S slideboard, working on standing CGa, ?w/c level at DC, mod I goals.   Revisions to Treatment Plan: N/A     Medical Summary Current Status: wound on back doing well; continent B/B; mild R ankle swelling; nerve pain RLE; min assist/supervision transfers squat pivot- goal w/c level mod I    Barriers to Discharge: Decreased family/caregiver support;Insurance for SNF coverage;Neurogenic Bowel & Bladder;Medical stability  Barriers to Discharge Comments: cauda equina syndrome Possible Resolutions to Barriers: working on standing/estim   Continued Need for Acute Rehabilitation Level of Care: The patient requires daily medical management by a physician with  specialized training in physical medicine and rehabilitation for the following reasons: Direction of a multidisciplinary physical rehabilitation program to maximize functional independence : Yes Medical management of patient stability for increased activity during participation in an intensive rehabilitation regime.: Yes Analysis of laboratory values and/or radiology reports with any subsequent need for medication adjustment and/or medical intervention. : Yes   I attest that I was present, lead the team conference, and concur with the assessment and plan of the team.   Trish Mage 11/21/2018, 7:13 PM  Team conference was held via web/ teleconference due to COVID - 19

## 2018-11-21 NOTE — Plan of Care (Signed)
  Problem: RH Balance Goal: LTG Patient will maintain dynamic standing with ADLs (OT) Description: LTG:  Patient will maintain dynamic standing balance with assist during activities of daily living (OT)  Outcome: Not Applicable Flowsheets (Taken 11/21/2018 1239) LTG: Pt will maintain dynamic standing balance during ADLs with: (Goal d/c 11/3-AR) -- Note: Goal d/c as standing not at functional level at this time. Suzie Vandam, OTR/L   Problem: Sit to Stand Goal: LTG:  Patient will perform sit to stand in prep for activites of daily living with assistance level (OT) Description: LTG:  Patient will perform sit to stand in prep for activites of daily living with assistance level (OT) Outcome: Not Applicable Flowsheets (Taken 11/21/2018 1239) LTG: PT will perform sit to stand in prep for activites of daily living with assistance level: (Goal d/c 11/3-AR) -- Note: Goal d/c as standing not at functional level at this time. Dnasia Gauna, OTR/L

## 2018-11-21 NOTE — Progress Notes (Signed)
Occupational Therapy Weekly Progress Note  Patient Details  Name: Joseph Reeves MRN: [id number] Date of Birth: [birth year]  Beginning of progress report period: November 14, 2018 End of progress report period: November 21, 2018  Today's Date: 11/21/2018 OT Individual Time: 1330-1430 OT Individual Time Calculation (min): 60 min    Patient has met 3 of 3 short term goals.  Pt is making excellent progress towards OT goals. He is completing sliding board or squat pivot transfers at close supervision-CGA level. Bathing at shower level seated on padded tub bench with set-up.  Currently using STEDY for sit>stands for toileting and LB dressing tasks, however, cont with education and encouragement to problem solve other methods for clothing management as no STEDY at d/c and standing not at functional level.   Patient continues to demonstrate the following deficits: muscle paralysis and decreased sitting balance, decreased standing balance, decreased postural control, decreased balance strategies and difficulty maintaining precautions and therefore will continue to benefit from skilled OT intervention to enhance overall performance with BADL, iADL and Reduce care partner burden.  Patient progressing toward long term goals..  Plan of care revisions: Sit>stand and dynamic standing balance goals d/c as standing not at functional level and recommending strict w/c level only at d/c. .  OT Short Term Goals Week 1:  OT Short Term Goal 1 (Week 1): Pt will don lumbar corset with supervision. OT Short Term Goal 1 - Progress (Week 1): Met OT Short Term Goal 2 (Week 1): Pt will perform LB dressing with mod A overall. OT Short Term Goal 2 - Progress (Week 1): Met OT Short Term Goal 3 (Week 1): Pt will perform toilet transfer with max A of 1. OT Short Term Goal 3 - Progress (Week 1): Met Week 2:  OT Short Term Goal 1 (Week 2): STG=LTG due to LOS  Skilled Therapeutic Interventions/Progress Updates:    Pt seen  for OT ADL bathing/dressing session. Pt sitting upright in recliner upon arrival, complaints of fatigue, but otherwise feeling well and ready for tx session.  Extensive discussion regarding modified bathing/dressing routine in case that home bathroom is not w/c accessible (sister has not yet returned w/c home measurement sheet.) Options including EOB/bed level or seated on drop arm BSC. PT willing to attempt bathing on [facility] though concerned about thoroughness for buttock/pericare hygiene when not actually getting in the shower.  Used STEDY to transfer to drop arm BSC from recliner. Completed bathing/dressing routine seated on BSC. Set-up UB bathing/dressing. Close supervision and assist to stabilize equipment when completing lateral leans for buttock/pericare hygiene and use of LH sponge to wash LEs. Multiple lateral leans required to R I+(leaning ton w/c) and to L in order to advance pants fully over hips.  Min A squat pivot transfer to w/c. He completed grooming tasks from w/c level at sink mod I. Pt transitioned back to recliner via STEDY at end of session, left seated with all needs in reach.  Education provided throughout session regarding OT/PT goals, modified ADLs, DME, continuum of care, activity progression and d/c planning.   Therapy Documentation Precautions:  Precautions Precautions: Back, Fall Precaution Booklet Issued: No Required Braces or Orthoses: Spinal Brace Spinal Brace: Lumbar corset, Applied in sitting position Restrictions Weight Bearing Restrictions: No Pain: Pain Assessment Pain Scale: 0-10 Pain Score: 0-No pain   Therapy/Group: Individual Therapy  Joseph Reeves L 11/21/2018, 6:25 AM

## 2018-11-21 NOTE — Progress Notes (Signed)
Physical Therapy Weekly Progress Note  Patient Details  Name: Joseph Reeves MRN: [id number] Date of Birth: 25-Apr-1965  Beginning of progress report period: November 14, 2018 End of progress report period: November 21, 2018  Today's Date: 11/21/2018 PT Individual Time: 0845-1000 PT Individual Time Calculation (min): 75 min   Patient has met 2 of 3 short term goals.  Pt is currently able to perform bed mobility with Supervision to min A overall, squat pivot and slide board transfers with close Supervision, and w/c mobility with Supervision x 150 (+) ft. Pt is not currently safe to stand to RW but has been able to stand to stedy and in // bars with CGA to stand. Pt relies heavily on BUE in standing due to significant BLE weakness. Pt is making progress towards LTG and will likely d/c home at a wheelchair level.  Patient continues to demonstrate the following deficits muscle weakness, abnormal tone and unbalanced muscle activation and decreased standing balance, decreased postural control and decreased balance strategies and therefore will continue to benefit from skilled PT intervention to increase functional independence with mobility.  Patient progressing toward long term goals..  Plan of care revisions: Upgraded transfer goal from Supervision to mod I due to progress. Downgraded gait goal of 10' from min A to total A due to slow progress..  PT Short Term Goals Week 1:  PT Short Term Goal 1 (Week 1): Pt will complete least restrictive transfer with mod A x 1 PT Short Term Goal 1 - Progress (Week 1): Met PT Short Term Goal 2 (Week 1): Pt will initiate w/c mobility PT Short Term Goal 2 - Progress (Week 1): Met PT Short Term Goal 3 (Week 1): Pt will perform sit to stand with mod A to RW PT Short Term Goal 3 - Progress (Week 1): Not met Week 2:  PT Short Term Goal 1 (Week 2): =LTG due to ELOS  Skilled Therapeutic Interventions/Progress Updates:    Pt received seated in recliner, agreeable  to PT session. No complaints of pain. Pt is min A to don shoes while seated in chair. Sit to stand with CGA to stedy, stedy transfer to w/c. Manual w/c propulsion x 150 ft with use of BUE and setup A. Sit to stand in standing frame with CGA. Focus on upright standing posture with multimodal cueing for hip and trunk extension with decreased BUE support. Pt is able to tolerate standing 4 x 2 min with semi-seated rest breaks in standing frame sling between bouts of standing. Standing mini-squats 3 x 7 reps from semi-seated position to full stand with focus on BLE activation to initiate stand. Pt fatigues quickly with standing activity, v/c for breathing techniques. Squat pivot transfer w/c to level mat table with Supervision. Sit to sidelying with min A for RLE management. Rolling L/R with Supervision with v/c for LE management. Sidelying to sitting EOM with Supervision. Pt reports feeling comfortable in R sidelying and will attempt to sleep in hospital bed this week vs recliner for improved comfort and position change. Squat pivot transfer w/c to/from elevated mat table with Supervision to CGA for uneven surface height transfer. Per pt report his bed at home and also the vehicles his family owns are more elevated. Will continue to practice uneven surface transfers and simulation of pt's home environment to prepare for a safe d/c home. Stedy transfer w/c to recliner. Pt left seated in recliner with BLE elevated, RLE PRAFO in place.  Therapy Documentation Precautions:  Precautions Precautions: Back,  Fall Precaution Booklet Issued: No Required Braces or Orthoses: Spinal Brace Spinal Brace: Lumbar corset, Applied in sitting position Restrictions Weight Bearing Restrictions: No   Therapy/Group: Individual Therapy   Excell Seltzer, PT, DPT 11/21/2018, 10:09 AM

## 2018-11-21 NOTE — Plan of Care (Signed)
Upgraded transfer goal from Supervision to mod I due to progress. Downgraded gait goal x 10' from min A to total A due to slow progress.

## 2018-11-21 NOTE — Progress Notes (Signed)
Physical Therapy Session Note  Patient Details  Name: Joseph Reeves MRN: [id number] Date of Birth: 1965/08/09  Today's Date: 11/21/2018 PT Individual Time: 8527-7824 PT Individual Time Calculation (min): 54 min   Short Term Goals: Week 1:  PT Short Term Goal 1 (Week 1): Pt will complete least restrictive transfer with mod A x 1 PT Short Term Goal 2 (Week 1): Pt will initiate w/c mobility PT Short Term Goal 3 (Week 1): Pt will perform sit to stand with mod A to RW  Skilled Therapeutic Interventions/Progress Updates:  Pt received in recliner & agreeable to tx. Pt with continent void in urinal with independence. Pt transfers sit<>stand in stedy with supervision and use of lift. Pt performs hand hygiene at sink in stedy with close supervision for balance then pt assisted to w/c. Pt propels w/c room>gym>dayroom>room with BUE & set up assist. In gym, pt transferred sit<>stand in parallel bars x 3 trials with min assist for sit>stand and while in standing, focused on marching in place, full hip extension with glute/hamstring activation, increasing weight bearing through BLE & decreasing support on BUE on bars, and RLE weight bearing for strengthening. Pt transferred w/c<>nu-step via squat pivot with supervision and significant use of upper body to complete transfer. Pt utilized nu-step on level 2 x 4 minutes with BLE only with task focusing on BLE strengthening with pt reporting feeling a good stretch in posterior RLE, then decreasing to level 1 x 6 minutes with addition of BUE as needed 2/2 BLE (R>L) fatigue. Back in room pt transfers to recliner via stedy in same manner as noted before. Pt left in recliner with all needs in reach.  Therapy Documentation Precautions:  Precautions Precautions: Back, Fall Precaution Booklet Issued: No Required Braces or Orthoses: Spinal Brace Spinal Brace: Lumbar corset, Applied in sitting position Restrictions Weight Bearing Restrictions: No  Pain: Pt  denies c/o pain.   Therapy/Group: Individual Therapy  Waunita Schooner 11/21/2018, 12:05 PM

## 2018-11-21 NOTE — Plan of Care (Signed)
  Problem: Consults Goal: RH GENERAL PATIENT EDUCATION Description: See Patient Education module for education specifics. Outcome: Progressing Goal: Skin Care Protocol Initiated - if Braden Score 18 or less Description: If consults are not indicated, leave blank or document N/A Outcome: Progressing   Problem: RH BOWEL ELIMINATION Goal: RH STG MANAGE BOWEL WITH ASSISTANCE Description: STG Manage Bowel with mod I Assistance. Outcome: Progressing Goal: RH STG MANAGE BOWEL W/MEDICATION W/ASSISTANCE Description: STG Manage Bowel with Medication with mod I Assistance. Outcome: Progressing   Problem: RH BLADDER ELIMINATION Goal: RH STG MANAGE BLADDER WITH ASSISTANCE Description: STG Manage Bladder With min Assistance Outcome: Progressing Goal: RH STG MANAGE BLADDER WITH EQUIPMENT WITH ASSISTANCE Description: STG Manage Bladder With Equipment With min Assistance Outcome: Progressing   Problem: RH SAFETY Goal: RH STG ADHERE TO SAFETY PRECAUTIONS W/ASSISTANCE/DEVICE Description: STG Adhere to Safety Precautions With supervision Assistance/Device. Outcome: Progressing   Problem: RH PAIN MANAGEMENT Goal: RH STG PAIN MANAGED AT OR BELOW PT'S PAIN GOAL Description: Less than 4 on 0-10 scale Outcome: Progressing   Problem: RH KNOWLEDGE DEFICIT GENERAL Goal: RH STG INCREASE KNOWLEDGE OF SELF CARE AFTER HOSPITALIZATION Description: Pt will identify safety and spinal precautions along with medication compliance to manage bowel function prior to DC  Outcome: Progressing   

## 2018-11-21 NOTE — Progress Notes (Signed)
[place] PHYSICAL MEDICINE & REHABILITATION PROGRESS NOTE   Subjective/Complaints:  Pt reports having BMs/voiding still; below R mid calf, is very tingling/bothersome- wondering if can treat that.  Also, R toes and foot/ankle feel swollen/tight.  Got PRAFO for R foot due to R foot drop.    ROS: denies CP, SOB, N/V/D Objective:   No results found. Recent Labs    11/20/18 0452  WBC 6.3  HGB 10.8*  HCT 33.4*  PLT 368   Recent Labs    11/20/18 0452  NA 140  K 4.5  CL 103  CO2 29  GLUCOSE 124*  BUN 18  CREATININE 1.17  CALCIUM 9.3    Intake/Output Summary (Last 24 hours) at 11/21/2018 0930 Last data filed at 11/21/2018 0730 Gross per 24 hour  Intake 598 ml  Output 950 ml  Net -352 ml     Physical Exam: Vital Signs Blood pressure 134/84, pulse 80, temperature 98.8 F (37.1 C), temperature source Oral, resp. rate 16, weight 100.5 kg, SpO2 100 %.  Physical Exam Nursing noteand vitalsreviewed. Constitutional:sitting up in bedside chair, appropriate and pleasant, bright affect, NAD HENT:  Head:Normocephalicand atraumatic.  Nose:Nose normal.  Mouth/Throat: Nooropharyngeal exudate.  Eyes:.conjugate gaze Neck:Normal range of motion.Neck supple. Cardiovascular:  RRR  Respiratory: CTA B/L with no wheezes, rales or rhonchi heardstill WU:JWJXGI:soft, NT, ND, (+)BS Musculoskeletal:  General: No tendernessor edema.  Comments: UEs 5/5 in deltoids, biceps, WE, triceps, grip and finger abd B/L LEs- RLE HF 2+/5, KE 4-/5, DF 2/5, PF 1/5, EHL 0/5 LLE HF 2/5 (slightly stronger than R), KE 3/5, DF 3/5, PF 2/5, EHL 2/5  Neurological: He is alertand oriented to person, place, and time. Nocranial nerve deficit. Patient is alert . Follows commands. Oriented x3. Decreased sensation ONLY at R S1 on exam- tested dermatomes on arms, torso and legs- otherwise intact everywhere else. Flaccid partial paralysis Skin: Back incision is well healing- looks  great Psych: bright affect   Assessment/Plan: 1. Functional deficits secondary to cauda equina syndrome  which require 3+ hours per day of interdisciplinary therapy in a comprehensive inpatient rehab setting.  Physiatrist is providing close team supervision and 24 hour management of active medical problems listed below.  Physiatrist and rehab team continue to assess barriers to discharge/monitor patient progress toward functional and medical goals  Care Tool:  Bathing    Body parts bathed by patient: Right arm, Left arm, Chest, Abdomen, Front perineal area, Right upper leg, Left upper leg, Face, Buttocks, Right lower leg, Left lower leg   Body parts bathed by helper: Buttocks     Bathing assist Assist Level: Set up assist     Upper Body Dressing/Undressing Upper body dressing   What is the patient wearing?: Pull over shirt, Orthosis Orthosis activity level: Performed by patient  Upper body assist Assist Level: Set up assist    Lower Body Dressing/Undressing Lower body dressing      What is the patient wearing?: Pants     Lower body assist Assist for lower body dressing: Supervision/Verbal cueing(While standing in STEDY)     Toileting Toileting    Toileting assist Assist for toileting: Supervision/Verbal cueing     Transfers Chair/bed transfer  Transfers assist  Chair/bed transfer activity did not occur: Safety/medical concerns  Chair/bed transfer assist level: Dependent - mechanical lift     Locomotion Ambulation   Ambulation assist   Ambulation activity did not occur: Safety/medical concerns  Assist level: 2 helpers Assistive device: Parallel bars Max distance: 5'  Walk 10 feet activity   Assist  Walk 10 feet activity did not occur: Safety/medical concerns        Walk 50 feet activity   Assist Walk 50 feet with 2 turns activity did not occur: Safety/medical concerns         Walk 150 feet activity   Assist Walk 150 feet activity  did not occur: Safety/medical concerns         Walk 10 feet on uneven surface  activity   Assist Walk 10 feet on uneven surfaces activity did not occur: Safety/medical concerns         Wheelchair     Assist Will patient use wheelchair at discharge?: Yes Type of Wheelchair: Manual Wheelchair activity did not occur: Safety/medical concerns  Wheelchair assist level: Set up assist Max wheelchair distance: 150'    Wheelchair 50 feet with 2 turns activity    Assist    Wheelchair 50 feet with 2 turns activity did not occur: Safety/medical concerns   Assist Level: Set up assist   Wheelchair 150 feet activity     Assist  Wheelchair 150 feet activity did not occur: Safety/medical concerns   Assist Level: Set up assist   Blood pressure 134/84, pulse 80, temperature 98.8 F (37.1 C), temperature source Oral, resp. rate 16, weight 100.5 kg, SpO2 100 %.  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  10/31- some improvement in RLE noted  11/3- pt wants a "HEP" to do in room when not in therapy 2. Antithrombotics: -DVT/anticoagulation:SCDs. Check vascular study 10/28- has peroneal/post tibial DVT- since below knee, will start Lovenox 40 mg daily x 90 days from surgery; will recheck Dopplers next week -antiplatelet therapy: N/A 3. Pain Management:Hydrocodone as needed  10/28- Start Gabapentin 300 mg TID- hopefully will kick in soon, Baclofen 5 mg TID and con't Norco prn  10/29- pain better controlled- will increase nighttime Baclofen to 10 mg  11/3- will increase gabapentin to 600 mg BID and monitor for sleepiness 4. Mood:Provide emotional support -antipsychotic agents: N/A 5. Neuropsych: This patientiscapable of making decisions on hisown behalf. 6. Skin/Wound Care:Routine skin  checks 7. Fluids/Electrolytes/Nutrition:Routine in and outs with follow-up chemistries 8. Neurogenicbowel andbladder. Need to discuss removing Foley tube and begin voiding trial- however since he had >1L of volume in bladder and bladder has been stretched, Urology usually asks [place] to wait ~ 1 week of bladder rest to remove foley- will discuss starting Flomax to maximize potential of pt voiding after foley removed. - pt likely has neurogenic bowel and is during spinal shock- gut doesn't work during spinal shock and needs laxatives daily, as well as suppository nightly as part of bowel program- if pt unable to go on his own today, will put in for Suppository (enemas usually don't work in cauda equina syndrome patients) for this evening.  10/27- pt cleaned out last night- went without suppository- might not have full neurogenic bowel  10/28- are in/out cathing- will order lidocaine jelly prn; started flomax last night; hopefully will be able to void.  10/29- voiding some- not emptying yet- con't Flomax  10/30- emptying and voiding and having BMs- continent- no caths required in 24+ hrs. Will see if can stop Flomax last week of rehab. 9. Postoperative anemia. CBC from 11/2 shows  Hgb of 10.8. 10. SCI education- will educate pt on SCI issues during inpt rehab. Will likely need Rehab  physician in [place], [place] and in [place] in future. 11. High BP- running 140s/80s- on no meds- said he was on no meds prior to admission- will monitor- could be pain; if doesn't improve, will start low dose meds.  11/3- now 130s/80s- better    LOS: 8 days A FACE TO FACE EVALUATION WAS PERFORMED  Sidni Fusco 11/21/2018, 9:30 AM

## 2018-11-22 ENCOUNTER — Ambulatory Visit (HOSPITAL_COMMUNITY): Payer: Self-pay | Admitting: *Deleted

## 2018-11-22 ENCOUNTER — Inpatient Hospital Stay (HOSPITAL_COMMUNITY): Payer: Self-pay | Admitting: Physical Therapy

## 2018-11-22 ENCOUNTER — Inpatient Hospital Stay (HOSPITAL_COMMUNITY): Payer: Self-pay | Admitting: Occupational Therapy

## 2018-11-22 NOTE — Progress Notes (Signed)
Physical Therapy Session Note  Patient Details  Name: Jamiah Recore MRN: [id number] Date of Birth: [birth year]  Today's Date: 11/22/2018 PT Individual Time: 9417-4081  PT Individual Time Calculation (min): 43 min   Short Term Goals: Week 2:  PT Short Term Goal 1 (Week 2): =LTG due to ELOS  Skilled Therapeutic Interventions/Progress Updates:  Pt received in recliner & agreeable to tx, denying c/o pain. Provided pt with HEP handout with exercises consisting of quad sets, heel slides, hip abduction slides (AAROM RLE), BLE heel cord self heel cord stretch with use of sheet (pt instructed to use reacher to position sheet to maintain back precautions), seated hip flexion, & long arc quads.  Pt performed all exercises with instructional cuing from technique while sitting/lying in recliner. At end of session pt left in recliner with all needs in reach.  Therapy Documentation Precautions:  Precautions Precautions: Back, Fall Precaution Booklet Issued: No Required Braces or Orthoses: Spinal Brace Spinal Brace: Lumbar corset, Applied in sitting position Restrictions Weight Bearing Restrictions: No   Therapy/Group: Individual Therapy  Waunita Schooner 11/22/2018, 3:52 PM

## 2018-11-22 NOTE — Progress Notes (Signed)
Recreational Therapy Session Note  Patient Details  Name: Joseph Reeves MRN: [id number] Date of Birth: 01/01/1966 Today's Date: 11/22/2018 Time:  6063-0160  Pain: no c/o Skilled Therapeutic Interventions/Progress Updates: Session focused on activity tolerance, transfers dynamic sitting balance during co-treat with PT.  Pt performed transfers with min assist, w/c mobility using BUE's with Mod I.  Pt sat on dyna-disc with feet elevated from the floor for  balance challenge while using a 5 pound weighted dowel in BUEs to hit a ball from all directions with supervision.  Therapy/Group: Co-Treatment  Jhanae Jaskowiak 11/22/2018, 12:08 PM

## 2018-11-22 NOTE — Progress Notes (Signed)
[place] PHYSICAL MEDICINE & REHABILITATION PROGRESS NOTE   Subjective/Complaints:  Pt reports sleeping well- slept in bed last night for first time in days and didn't have groin/buttock/back pain like had before.  Tingling in feet is getting better/a little less.  Using urinal during night; toilet during day most of time.    ROS: denies CP, SOB, N/V/D Objective:   No results found. Recent Labs    11/20/18 0452  WBC 6.3  HGB 10.8*  HCT 33.4*  PLT 368   Recent Labs    11/20/18 0452  NA 140  K 4.5  CL 103  CO2 29  GLUCOSE 124*  BUN 18  CREATININE 1.17  CALCIUM 9.3    Intake/Output Summary (Last 24 hours) at 11/22/2018 0915 Last data filed at 11/22/2018 0730 Gross per 24 hour  Intake 240 ml  Output 2325 ml  Net -2085 ml     Physical Exam: Vital Signs Blood pressure 123/77, pulse 90, temperature 98.9 F (37.2 C), temperature source Oral, resp. rate 16, weight 94.3 kg, SpO2 98 %.  Physical Exam Nursing noteand vitalsreviewed. Constitutional:sitting up in bed bright cheery affect, wiggling toes on both feet, better on L, NAD HENT:  Head:Normocephalicand atraumatic.  Nose:Nose normal.  Mouth/Throat: Nooropharyngeal exudate.  Eyes:.conjugate gaze Neck:Normal range of motion.Neck supple. Cardiovascular:  RRR  Respiratory: CTA B/L with no wheezes, rales or rhonchi heard JX:BJYNGI:soft, NT, ND, (+)BS Musculoskeletal:  General: No tendernessor edema.  Comments: UEs 5/5 in deltoids, biceps, WE, triceps, grip and finger abd B/L LEs- RLE HF 2+/5, KE 4-/5, DF 2/5, PF 1/5, EHL 1/5* LLE HF 2/5 (slightly stronger than R), KE 3/5, DF 3/5, PF 2/5, EHL 2/5- can wiggle toes now on R foot  Neurological: He is alertand oriented to person, place, and time. Nocranial nerve deficit. Patient is alert . Follows commands. Oriented x3. Decreased sensation ONLY at R S1 on exam- tested dermatomes on arms, torso and legs- otherwise intact everywhere  else. Flaccid partial paralysis Skin: Back incision is well healing- looks great Psych: bright affect   Assessment/Plan: 1. Functional deficits secondary to cauda equina syndrome  which require 3+ hours per day of interdisciplinary therapy in a comprehensive inpatient rehab setting.  Physiatrist is providing close team supervision and 24 hour management of active medical problems listed below.  Physiatrist and rehab team continue to assess barriers to discharge/monitor patient progress toward functional and medical goals  Care Tool:  Bathing    Body parts bathed by patient: Right arm, Left arm, Chest, Abdomen, Front perineal area, Right upper leg, Left upper leg, Face, Buttocks, Right lower leg, Left lower leg   Body parts bathed by helper: Buttocks     Bathing assist Assist Level: Supervision/Verbal cueing     Upper Body Dressing/Undressing Upper body dressing   What is the patient wearing?: Pull over shirt, Orthosis Orthosis activity level: Performed by patient  Upper body assist Assist Level: Set up assist    Lower Body Dressing/Undressing Lower body dressing      What is the patient wearing?: Pants     Lower body assist Assist for lower body dressing: Supervision/Verbal cueing     Toileting Toileting    Toileting assist Assist for toileting: Supervision/Verbal cueing     Transfers Chair/bed transfer  Transfers assist  Chair/bed transfer activity did not occur: Safety/medical concerns  Chair/bed transfer assist level: Dependent - mechanical lift     Locomotion Ambulation   Ambulation assist   Ambulation activity did not occur: Safety/medical  concerns  Assist level: 2 helpers Assistive device: Parallel bars Max distance: 5'   Walk 10 feet activity   Assist  Walk 10 feet activity did not occur: Safety/medical concerns        Walk 50 feet activity   Assist Walk 50 feet with 2 turns activity did not occur: Safety/medical concerns          Walk 150 feet activity   Assist Walk 150 feet activity did not occur: Safety/medical concerns         Walk 10 feet on uneven surface  activity   Assist Walk 10 feet on uneven surfaces activity did not occur: Safety/medical concerns         Wheelchair     Assist Will patient use wheelchair at discharge?: Yes Type of Wheelchair: Manual Wheelchair activity did not occur: Safety/medical concerns  Wheelchair assist level: Set up assist Max wheelchair distance: 150'    Wheelchair 50 feet with 2 turns activity    Assist    Wheelchair 50 feet with 2 turns activity did not occur: Safety/medical concerns   Assist Level: Set up assist   Wheelchair 150 feet activity     Assist  Wheelchair 150 feet activity did not occur: Safety/medical concerns   Assist Level: Set up assist   Blood pressure 123/77, pulse 90, temperature 98.9 F (37.2 C), temperature source Oral, resp. rate 16, weight 94.3 kg, SpO2 98 %.  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  10/31- some improvement in RLE noted  11/3- pt wants a "HEP" to do in room when not in therapy 2. Antithrombotics: -DVT/anticoagulation:SCDs. Check vascular study 10/28- has peroneal/post tibial DVT- since below knee, will start Lovenox 40 mg daily x 90 days from surgery; will recheck Dopplers next week 11/4- will order dopplers tomorrow -antiplatelet therapy: N/A 3. Pain Management:Hydrocodone as needed  10/28- Start Gabapentin 300 mg TID- hopefully will kick in soon, Baclofen 5 mg TID and con't Norco prn  10/29- pain better controlled- will increase nighttime Baclofen to 10 mg  11/3- will increase gabapentin to 600 mg BID and monitor for sleepiness 4. Mood:Provide emotional support -antipsychotic agents: N/A 5. Neuropsych:  This patientiscapable of making decisions on hisown behalf. 6. Skin/Wound Care:Routine skin checks 7. Fluids/Electrolytes/Nutrition:Routine in and outs with follow-up chemistries 8. Neurogenicbowel andbladder. Need to discuss removing Foley tube and begin voiding trial- however since he had >1L of volume in bladder and bladder has been stretched, Urology usually asks [place] to wait ~ 1 week of bladder rest to remove foley- will discuss starting Flomax to maximize potential of pt voiding after foley removed. - pt likely has neurogenic bowel and is during spinal shock- gut doesn't work during spinal shock and needs laxatives daily, as well as suppository nightly as part of bowel program- if pt unable to go on his own today, will put in for Suppository (enemas usually don't work in cauda equina syndrome patients) for this evening.  10/30- emptying and voiding and having BMs- continent- no caths required in 24+ hrs. Will see if can stop Flomax last week of rehab. 9. Postoperative anemia. CBC from 11/2 shows  Hgb of 10.8. 10. SCI education- will educate pt on SCI issues during inpt rehab. Will likely need Rehab physician in [place], [place] and in [place] in future. 11. High BP- running 140s/80s- on no meds- said he was on no meds prior to  admission- will monitor- could be pain; if doesn't improve, will start low dose meds.  11/3- now 130s/80s- better  11/4- BPs 120s/70s    LOS: 9 days A FACE TO FACE EVALUATION WAS PERFORMED  Kynlea Blackston 11/22/2018, 9:15 AM

## 2018-11-22 NOTE — Progress Notes (Signed)
Occupational Therapy Session Note  Patient Details  Name: Joseph Reeves MRN: [id number] Date of Birth: Jun 10, 1965  Today's Date: 11/22/2018 OT Individual Time: 1300-1400 OT Individual Time Calculation (min): 60 min    Short Term Goals: Week 2:  OT Short Term Goal 1 (Week 2): STG=LTG due to LOS  Skilled Therapeutic Interventions/Progress Updates:    Pt seen for OT ADL bathing/dressing session. Pt sitting up in recliner upon arrival, agreeable to tx session. Denied pain though complaints of generalzied fatigue from previous tx sessions, willing to cont as able.  Utilized STEDY for transfer in/out of shower on TTB, sit<>Stand with supervision in STEDY. He bathed seated on TTB with set-up using LH sponge and features of cut out tub bench in order to complete pericare/buttock hygiene.  He returned to EOB to dress, supervision for LB dressing via lateral leans for clothing management. Set-up UB dressing.  Grooming tasks mod I at sink.  Obtained ultra lightweight w/c for pt, transferred into it with supervision via squat pivot with VCs for foot placement. Practiced w/c mobility throughout unit in new w/c with supervision-mod I. Extensive education/discussion regarding options for custom w/c and functional implications to keep in mind when choosing w/c.  Pt returned to room at end of session and transitioned into recliner. Pt left seated with all needs in reach.   Therapy Documentation Precautions:  Precautions Precautions: Back, Fall Precaution Booklet Issued: No Required Braces or Orthoses: Spinal Brace Spinal Brace: Lumbar corset, Applied in sitting position Restrictions Weight Bearing Restrictions: No   Therapy/Group: Individual Therapy  Markesia Crilly L 11/22/2018, 6:55 AM

## 2018-11-22 NOTE — Progress Notes (Signed)
Physical Therapy Session Note  Patient Details  Name: Joseph Reeves MRN: [id number] Date of Birth: [birth year]  Today's Date: 11/22/2018 PT Individual Time: 9622-2979; 8921-1941 PT Individual Time Calculation (min): 75 min and 30 min   Short Term Goals: Week 2:  PT Short Term Goal 1 (Week 2): =LTG due to ELOS  Skilled Therapeutic Interventions/Progress Updates:    Session 1: Pt received supine in bed, agreeable to PT session. No complaints of pain, reports sleeping well in bed overnight. Supine to sit with Supervision. Squat pivot transfer bed to w/c with Supervision. Pt is setup A for w/c mobility x 150 ft with use of BUE. Car transfer w/c to/from simulation car with gradual increase in car seat height up to 30" as pt owns vehicles with higher seat heights. Pt is CGA for car transfer with v/c for safe transfer technique and for sequencing. Attempt sit to stand with one UE on w/c armrest and one on // bar, max A to stand and pt is unsafe due to significant reliance on BUE support to maintain standing. Sit to stand with CGA in // bars with use of BUE on // bars. Standing alt L/R marches with BUE support and min A for balance, R knee blocked in stance; standing alt L/R 1" step-taps; standing alt R/L forward/backward steps. Manual w/c propulsion with use of BLE only 2 x 10 ft forward/backward for BLE strengthening. Stedy transfer w/c to recliner, pt left seated in recliner with needs in reach at end of session.  Session 2: Pt received seated in recliner, agreeable to PT session. No complaints of pain. Squat pivot transfer recliner to w/c with min A. Manual w/c propulsion to/from therapy gym at mod I level. Squat pivot transfer w/c to/from mat table with Supervision. Session focus on seated balance EOM while seated on dynadisc performing volleyball with 2# weighted dowel progressing to 5# weighted dowel with close SBA for balance. Education with patient about maintaining back precautions while  performing this activity as well as carryover to everyday activities. Education with patient about safety awareness as well with leaning/reaching and applying this to functional tasks as well. Squat pivot transfer back to recliner with min A at end of session. Pt left seated in recliner in room with needs in reach. Cotreatment session with RT.  Therapy Documentation Precautions:  Precautions Precautions: Back, Fall Precaution Booklet Issued: No Required Braces or Orthoses: Spinal Brace Spinal Brace: Lumbar corset, Applied in sitting position Restrictions Weight Bearing Restrictions: No    Therapy/Group: Individual Therapy   Excell Seltzer, PT, DPT  11/22/2018, 3:14 PM

## 2018-11-23 ENCOUNTER — Inpatient Hospital Stay (HOSPITAL_COMMUNITY): Payer: Self-pay

## 2018-11-23 ENCOUNTER — Inpatient Hospital Stay (HOSPITAL_COMMUNITY): Payer: Self-pay | Admitting: Physical Therapy

## 2018-11-23 ENCOUNTER — Inpatient Hospital Stay (HOSPITAL_COMMUNITY): Payer: Self-pay | Admitting: Occupational Therapy

## 2018-11-23 DIAGNOSIS — I82409 Acute embolism and thrombosis of unspecified deep veins of unspecified lower extremity: Secondary | ICD-10-CM

## 2018-11-23 MED ORDER — RIVAROXABAN 15 MG PO TABS
15.0000 mg | ORAL_TABLET | Freq: Two times a day (BID) | ORAL | Status: DC
  Administered 2018-11-23 – 2018-11-30 (×14): 15 mg via ORAL
  Filled 2018-11-23 (×14): qty 1

## 2018-11-23 NOTE — Progress Notes (Signed)
Physical Therapy Session Note  Patient Details  Name: Joseph Reeves MRN: [id number] Date of Birth: 03-10-1965  Today's Date: 11/23/2018 PT Individual Time: 0900-1015 PT Individual Time Calculation (min): 75 min   Short Term Goals: Week 2:  PT Short Term Goal 1 (Week 2): =LTG due to ELOS  Skilled Therapeutic Interventions/Progress Updates:    Pt received seated in recliner in room, agreeable to PT session. No complaints of pain. Squat pivot transfer recliner to ultra lightweight manual w/c with CGA. Pt is able to perform w/c mobility at mod I level. Squat pivot transfer w/c to/from mat table with Supervision. Sit to from sidelying on mat table with Supervision for placement of electrodes on R soleus and gastroc muscles. NMES level 38 x 10 min to R soleus while in seated position with focus on PF activation. NMES level 36 x 10 min to R gastroc muscle while in seated position with focus on PF activation and knee flexion. Pt exhibits some ability to contract muscle with assist from electrical stimulation. Also assessed NMES level 38 to R gastroc in supine, minimal PF noted. Skin assessed following electrical stimulation with no redness noted. Supine BLE strengthening therex: heel slides x 10 reps. Squat pivot transfer back to recliner with CGA. Pt left seated in recliner in room with needs in reach at end of session.  Therapy Documentation Precautions:  Precautions Precautions: Back, Fall Precaution Booklet Issued: No Required Braces or Orthoses: Spinal Brace Spinal Brace: Lumbar corset, Applied in sitting position Restrictions Weight Bearing Restrictions: No    Therapy/Group: Individual Therapy   Excell Seltzer, PT, DPT  11/23/2018, 4:15 PM

## 2018-11-23 NOTE — Progress Notes (Signed)
Social Work Patient ID: Joseph Reeves, male   DOB: 04/10/1965, 53 y.o.   MRN: [id number]  Have reviewed team conference with pt who is aware that we continue to plan toward d/c 11/12 and mod I w/c level goals overall.  Still to speak with sister again to review d/c plans.  Working on securing a w/c that can be donated to pt for d/c as he is uninsured and is a resident of [place] where he will be returning.  Continue to follow.  Jadakiss Barish, LCSW

## 2018-11-23 NOTE — Progress Notes (Signed)
Transitions of Care Pharmacist Note  Joseph Reeves is a 53 y.o. male that has been diagnosed with DVT and will be prescribed Xarelto (rivaroxaban) at discharge.   Patient Education: I provided the following education on 11/5 to the patient: How to take the medication Described what the medication is Signs of bleeding Signs/symptoms of VTE and stroke  Answered their questions  Discharge Medications Plan: The patient wants to have their discharge medications filled by the Transitions of Care pharmacy rather than their usual pharmacy.  The discharge orders pharmacy has been changed to the Transitions of Care pharmacy, the patient will receive a phone call regarding co-pay, and their medications will be delivered by the [facility] pharmacy.    Thank you,   Berenice Bouton, PharmD PGY1 Pharmacy Resident Office phone: [phone] November 23, 2018

## 2018-11-23 NOTE — Plan of Care (Signed)
  Problem: Consults Goal: RH GENERAL PATIENT EDUCATION Description: See Patient Education module for education specifics. Outcome: Progressing Goal: Skin Care Protocol Initiated - if Braden Score 18 or less Description: If consults are not indicated, leave blank or document N/A Outcome: Progressing   Problem: RH BOWEL ELIMINATION Goal: RH STG MANAGE BOWEL WITH ASSISTANCE Description: STG Manage Bowel with mod I Assistance. Outcome: Progressing Goal: RH STG MANAGE BOWEL W/MEDICATION W/ASSISTANCE Description: STG Manage Bowel with Medication with mod I Assistance. Outcome: Progressing   Problem: RH BLADDER ELIMINATION Goal: RH STG MANAGE BLADDER WITH ASSISTANCE Description: STG Manage Bladder With min Assistance Outcome: Progressing Goal: RH STG MANAGE BLADDER WITH EQUIPMENT WITH ASSISTANCE Description: STG Manage Bladder With Equipment With min Assistance Outcome: Progressing   Problem: RH SAFETY Goal: RH STG ADHERE TO SAFETY PRECAUTIONS W/ASSISTANCE/DEVICE Description: STG Adhere to Safety Precautions With supervision Assistance/Device. Outcome: Progressing   Problem: RH PAIN MANAGEMENT Goal: RH STG PAIN MANAGED AT OR BELOW PT'S PAIN GOAL Description: Less than 4 on 0-10 scale Outcome: Progressing   Problem: RH KNOWLEDGE DEFICIT GENERAL Goal: RH STG INCREASE KNOWLEDGE OF SELF CARE AFTER HOSPITALIZATION Description: Pt will identify safety and spinal precautions along with medication compliance to manage bowel function prior to DC  Outcome: Progressing   

## 2018-11-23 NOTE — Progress Notes (Signed)
Occupational Therapy Session Note  Patient Details  Name: Joseph Reeves MRN: [id number] Date of Birth: 1965-01-20  Today's Date: 11/23/2018 OT Individual Time: 1100-1200 and 1415-1500 OT Individual Time Calculation (min): 60 min and 45 min   Short Term Goals: Week 2:  OT Short Term Goal 1 (Week 2): STG=LTG due to LOS  Skilled Therapeutic Interventions/Progress Updates:    Session One: Pt seen for OT session focusing on neuro re-ed and LE strengthening/stretching. Pt sitting up in recliner upon arrival, agreeable to tx session and denying pain. Completed squat pivot transfers throughout session at [place] I level. In therapy gym, transitioned into supine>prone on mat with supervision. Prolonged stretch in prone for hip flexors, education provided regarding functional benefits of this stretch/positioning.  Transitioned into quadraped for neuro re-ed for LEs as well as core strengthening, pt tolerating well and transitioned into tall knees with support of bench.  From prone position, completed hamstring curls, x5 on each side with min A for R LE and VCs for controlled movements, rest breaks required throughout. Quad stretch from prone position. Supine hamstring stretch performed on B sides. During exercises/stretching, education provided along with recreational therapist regarding d/c planning, specifically community re-entry and functional problem solving accessibility, etc. Also discussed fall recovery and what to do in case of fall. Deemed unsafe to attempt floor transfer at this time due to pt's back pre-cautions.  Will plan for community outing next week to cont practice and problem solving community accessibility. Pt returned to room at end of session, transitioned back to recliner and left with all needs in reach.   Session Two: Pt seen for OT ADL bathing/dressing session. Pt sitting up in recliner upon arrival, agreeable to tx session and denying pain. Completed squat pivot  transfers throughout session with supervision/ assist to [place] equipment.  Squat pivot to tub transfer bench. Bathed with set-up assist on TTB. Transitioned back to w/c and then returned to EOB to dress. Set-up assist UB/LB dressing while seated EOB and lateral leans to pull pants up. Addressed toilet transfers/toileting task without use of STEDY in prep for d/c home at w/c level. Squat pivot transfer w/c<>standard toilet without use of grab bars in simulation of home environment. Completed with supervision as well as lateral leans on toilet for clothing management.  Pt returned to recliner at end of session, left seated with all needs in reach. Education throughout session regarding activity progression, home set-up/accessibility and d/c planning.   Therapy Documentation Precautions:  Precautions Precautions: Back, Fall Precaution Booklet Issued: No Required Braces or Orthoses: Spinal Brace Spinal Brace: Lumbar corset, Applied in sitting position Restrictions Weight Bearing Restrictions: No   Therapy/Group: Individual Therapy  Lakoda Mcanany L 11/23/2018, 7:02 AM

## 2018-11-23 NOTE — Progress Notes (Signed)
Lower extremity venous has been completed.   Preliminary results in CV Proc.   Abram Sander 11/23/2018 2:24 PM

## 2018-11-23 NOTE — Progress Notes (Signed)
[place] PHYSICAL MEDICINE & REHABILITATION PROGRESS NOTE   Subjective/Complaints:  Pt reports slept In chair again last night- is just more comfortable.   tingling is better with increase in gabapentin and no side effects from meds.  Got "HEP" from PT/OT and doing that in between therapy.   ROS: denies CP, SOB, N/V/D Objective:   No results found. No results for input(s): WBC, HGB, HCT, PLT in the last 72 hours. No results for input(s): NA, K, CL, CO2, GLUCOSE, BUN, CREATININE, CALCIUM in the last 72 hours.  Intake/Output Summary (Last 24 hours) at 11/23/2018 0940 Last data filed at 11/23/2018 0908 Gross per 24 hour  Intake 840 ml  Output 1500 ml  Net -660 ml     Physical Exam: Vital Signs Blood pressure 127/70, pulse 95, temperature 98.6 F (37 C), resp. rate 16, weight 94.3 kg, SpO2 99 %.  Physical Exam Nursing noteand vitalsreviewed. Constitutional:sitting up in bedside chair, bright cheery affect, wiggling toes on both feet, better on L, NAD HENT:  Head:Normocephalicand atraumatic.  Nose:Nose normal.  Mouth/Throat: Nooropharyngeal exudate.  Eyes:.conjugate gaze Neck:Normal range of motion.Neck supple. Cardiovascular:  RRR  Respiratory: CTA B/L with no wheezes, rales or rhonchi heard JX:BJYN, NT, ND, (+)BS Musculoskeletal:  General: No tendernessor edema.  Comments: UEs 5/5 in deltoids, biceps, WE, triceps, grip and finger abd B/L LEs- RLE HF 2+/5, KE 4-/5, DF 2/5, PF 1/5, EHL 1/5 new LLE HF 2/5 (slightly stronger than R), KE 3/5, DF 3/5, PF 2/5, EHL 2/5- can wiggle toes now on R foot  Neurological: He is alertand oriented to person, place, and time. Nocranial nerve deficit. Patient is alert . Follows commands. Oriented x3. Decreased sensation ONLY at R S1 on exam- tested dermatomes on arms, torso and legs- otherwise intact everywhere else. Flaccid partial paralysis Skin: Back incision is well healing- looks great Psych:  bright affect   Assessment/Plan: 1. Functional deficits secondary to cauda equina syndrome  which require 3+ hours per day of interdisciplinary therapy in a comprehensive inpatient rehab setting.  Physiatrist is providing close team supervision and 24 hour management of active medical problems listed below.  Physiatrist and rehab team continue to assess barriers to discharge/monitor patient progress toward functional and medical goals  Care Tool:  Bathing    Body parts bathed by patient: Right arm, Left arm, Chest, Abdomen, Front perineal area, Right upper leg, Left upper leg, Face, Buttocks, Right lower leg, Left lower leg   Body parts bathed by helper: Buttocks     Bathing assist Assist Level: Supervision/Verbal cueing     Upper Body Dressing/Undressing Upper body dressing   What is the patient wearing?: Pull over shirt, Orthosis Orthosis activity level: Performed by patient  Upper body assist Assist Level: Set up assist    Lower Body Dressing/Undressing Lower body dressing      What is the patient wearing?: Pants     Lower body assist Assist for lower body dressing: Supervision/Verbal cueing     Toileting Toileting    Toileting assist Assist for toileting: Supervision/Verbal cueing     Transfers Chair/bed transfer  Transfers assist  Chair/bed transfer activity did not occur: Safety/medical concerns  Chair/bed transfer assist level: Minimal Assistance - Patient > 75%     Locomotion Ambulation   Ambulation assist   Ambulation activity did not occur: Safety/medical concerns  Assist level: 2 helpers Assistive device: Parallel bars Max distance: 5'   Walk 10 feet activity   Assist  Walk 10 feet activity did  not occur: Safety/medical concerns        Walk 50 feet activity   Assist Walk 50 feet with 2 turns activity did not occur: Safety/medical concerns         Walk 150 feet activity   Assist Walk 150 feet activity did not occur:  Safety/medical concerns         Walk 10 feet on uneven surface  activity   Assist Walk 10 feet on uneven surfaces activity did not occur: Safety/medical concerns         Wheelchair     Assist Will patient use wheelchair at discharge?: Yes Type of Wheelchair: Manual Wheelchair activity did not occur: Safety/medical concerns  Wheelchair assist level: Set up assist Max wheelchair distance: 150'    Wheelchair 50 feet with 2 turns activity    Assist    Wheelchair 50 feet with 2 turns activity did not occur: Safety/medical concerns   Assist Level: Set up assist   Wheelchair 150 feet activity     Assist  Wheelchair 150 feet activity did not occur: Safety/medical concerns   Assist Level: Set up assist   Blood pressure 127/70, pulse 95, temperature 98.6 F (37 C), resp. rate 16, weight 94.3 kg, SpO2 99 %.  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  10/31- some improvement in RLE noted  11/3- pt wants a "HEP" to do in room when not in therapy 2. Antithrombotics: -DVT/anticoagulation:SCDs. Check vascular study 10/28- has peroneal/post tibial DVT- since below knee, will start Lovenox 40 mg daily x 90 days from surgery; will recheck Dopplers next week 11/4- will order dopplers tomorrow 11/5- Dopplers ordered -antiplatelet therapy: N/A 3. Pain Management:Hydrocodone as needed  10/28- Start Gabapentin 300 mg TID- hopefully will kick in soon, Baclofen 5 mg TID and con't Norco prn  10/29- pain better controlled- will increase nighttime Baclofen to 10 mg  11/3- will increase gabapentin to 600 mg BID and monitor for sleepiness 4. Mood:Provide emotional support -antipsychotic agents: N/A 5. Neuropsych: This patientiscapable of making decisions on hisown behalf. 6. Skin/Wound  Care:Routine skin checks 7. Fluids/Electrolytes/Nutrition:Routine in and outs with follow-up chemistries 8. Neurogenicbowel andbladder. Need to discuss removing Foley tube and begin voiding trial- however since he had >1L of volume in bladder and bladder has been stretched, Urology usually asks us to wait ~ 1 week of bladder rest to remove foley- will discuss starting Flomax to maximize potential of pt voiding after foley removed. - pt likely has neurogenic bowel and is during spinal shock- gut doesn't work during spinal shock and needs laxatives daily, as well as suppository nightly as part of bowel program- if pt unable to go on his own today, will put in for Suppository (enemas usually don't work in cauda equina syndrome patients) for this evening.  10/30- emptying and voiding and having BMs- continent- no caths required in 24+ hrs. Will see if can stop Flomax last week of rehab. 9. Postoperative anemia. CBC from 11/2 shows  Hgb of 10.8. 10. SCI education- will educate pt on SCI issues during inpt rehab. [place] in [place] and in [place] in future. 11. High BP- running 140s/80s- on no meds- said he was on no meds prior to admission- will monitor- could be pain; if doesn't improve, will start low dose meds.  11/3- now 130s/80s- better  11/5- BPs 120s/70s- much better  LOS: 10 days A FACE TO FACE EVALUATION WAS PERFORMED  Joseph Reeves 11/23/2018, 9:40 AM

## 2018-11-23 NOTE — Progress Notes (Signed)
Recreational Therapy Session Note  Patient Details  Name: Joseph Reeves MRN: [id number] Date of Birth: [birth year] Today's Date: 11/23/2018 Time:  1105-1150 Pain: no c/o   Skilled Therapeutic Interventions/Progress Updates: Session focused on discharge planning in terms of home set up in [place] as he reports he hopes to return home about a week after discharge.  Discussed home set up, home access, home layout, community reintegration, accessing public restrooms and things to consider with travel.  Pt stated understanding and appreciation of information.  Also discussed the potential for an simulated outing next week prior to discharge.  Pt agreeable to participate.  Therapy/Group: Co-Treatment Falcon Mccaskey 11/23/2018, 12:01 PM

## 2018-11-24 ENCOUNTER — Inpatient Hospital Stay (HOSPITAL_COMMUNITY): Payer: Self-pay | Admitting: Occupational Therapy

## 2018-11-24 ENCOUNTER — Inpatient Hospital Stay (HOSPITAL_COMMUNITY): Payer: Self-pay | Admitting: Physical Therapy

## 2018-11-24 NOTE — Progress Notes (Signed)
Physical Therapy Session Note  Patient Details  Name: Joseph Reeves MRN: [id number] Date of Birth: October 29, 1965  Today's Date: 11/24/2018 PT Individual Time: 0900-1000 PT Individual Time Calculation (min): 60 min   Short Term Goals: Week 2:  PT Short Term Goal 1 (Week 2): =LTG due to ELOS  Skilled Therapeutic Interventions/Progress Updates:    Pt received seated in recliner in room, agreeable to PT session. No complaints of pain. Squat pivot transfer recliner to w/c with Supervision. Pt is at mod I level for w/c mobility and management of w/c parts. Squat pivot transfer w/c to 26" bed height with Supervision. Per pt his bed at home is even higher than this but he can set bed on the floor to decrease high and improve ease and safety of transfer. Sit to/from supine and sidelying on real bed with Supervision. Discussed pt's plans upon d/c home, home setup, safe ways to maneuver his environment at a w/c level, discussed stair vs ramp management, etc. Pt shows good insight into this deficits with well-thought out questions with regards to mobility upon d/c home. Sit to stand in // bars with CGA. Focus on decreased UE support in standing as well as decreased assist from therapist blocking R knee. Pt demos improved ability to maintain upright posture and R knee in locked position this date. One step forwards/backwards with BUE support and min A in // bars, R knee blocked in stance. Pt still exhibits significant reliance on BUE for maintaining standing balance but appears decreased from previous sessions, pt reports he is using UE for 70% of assist. Overall pt exhibits improved tolerance for standing this date with improved BLE muscle activation noted. Pt left seated up in w/c in room with needs in reach at end of session.  Therapy Documentation Precautions:  Precautions Precautions: Back, Fall Precaution Booklet Issued: No Required Braces or Orthoses: Spinal Brace Spinal Brace: Lumbar corset,  Applied in sitting position Restrictions Weight Bearing Restrictions: No    Therapy/Group: Individual Therapy   Excell Seltzer, PT, DPT  11/24/2018, 12:45 PM

## 2018-11-24 NOTE — Progress Notes (Signed)
[facility] PHYSICAL MEDICINE & REHABILITATION PROGRESS NOTE   Subjective/Complaints:  Pt reports tingling a lot better- no other issues- slept in bed again last night- pain better when in bed.  ROS: denies CP, SOB, N/V/D Objective:   [place] Lower Extremity Venous (dvt)  Result Date: 11/23/2018  Lower Venous Study Indications: Follow up.  Comparison Study: 11/14/18 previous Performing Technologist: Blanch MediaMegan Riddle RVS  Examination Guidelines: A complete evaluation includes B-mode imaging, spectral Doppler, color Doppler, and power Doppler as needed of all accessible portions of each vessel. Bilateral testing is considered an integral part of a complete examination. Limited examinations for reoccurring indications may be performed as noted.  +---------+---------------+---------+-----------+----------+--------------+ RIGHT    CompressibilityPhasicitySpontaneityPropertiesThrombus Aging +---------+---------------+---------+-----------+----------+--------------+ CFV      Full           Yes      Yes                                 +---------+---------------+---------+-----------+----------+--------------+ SFJ      Full                                                        +---------+---------------+---------+-----------+----------+--------------+ FV Prox  Full                                                        +---------+---------------+---------+-----------+----------+--------------+ FV Mid   Full                                                        +---------+---------------+---------+-----------+----------+--------------+ FV DistalFull                                                        +---------+---------------+---------+-----------+----------+--------------+ PFV      Full                                                        +---------+---------------+---------+-----------+----------+--------------+ POP      Full           Yes      Yes                   sluggish flow  +---------+---------------+---------+-----------+----------+--------------+ PTV      None                                         Acute          +---------+---------------+---------+-----------+----------+--------------+ PERO     None  Acute          +---------+---------------+---------+-----------+----------+--------------+   +---------+---------------+---------+-----------+----------+--------------+ LEFT     CompressibilityPhasicitySpontaneityPropertiesThrombus Aging +---------+---------------+---------+-----------+----------+--------------+ CFV      Full           Yes      Yes                                 +---------+---------------+---------+-----------+----------+--------------+ SFJ      Full                                                        +---------+---------------+---------+-----------+----------+--------------+ FV Prox  Full                                                        +---------+---------------+---------+-----------+----------+--------------+ FV Mid   Full                                                        +---------+---------------+---------+-----------+----------+--------------+ FV DistalFull                                                        +---------+---------------+---------+-----------+----------+--------------+ PFV      Full                                                        +---------+---------------+---------+-----------+----------+--------------+ POP      Full           Yes      Yes                  sluggish flow  +---------+---------------+---------+-----------+----------+--------------+ PTV      Full                                                        +---------+---------------+---------+-----------+----------+--------------+ PERO     Partial                                      Acute           +---------+---------------+---------+-----------+----------+--------------+     Summary: Right: Findings consistent with acute deep vein thrombosis involving the right posterior tibial veins, and right peroneal veins. No cystic structure found in the popliteal fossa. Left: Findings consistent with acute deep vein thrombosis involving the left peroneal veins. No cystic structure found in the popliteal fossa.  *  See table(s) above for measurements and observations. Electronically signed by Sherald Hess MD on 11/23/2018 at 3:37:52 PM.    Final    No results for input(s): WBC, HGB, HCT, PLT in the last 72 hours. No results for input(s): NA, K, CL, CO2, GLUCOSE, BUN, CREATININE, CALCIUM in the last 72 hours.  Intake/Output Summary (Last 24 hours) at 11/24/2018 0855 Last data filed at 11/24/2018 0527 Gross per 24 hour  Intake 598 ml  Output 1325 ml  Net -727 ml     Physical Exam: Vital Signs Blood pressure 124/77, pulse 87, temperature 98.8 F (37.1 C), temperature source Oral, resp. rate 17, weight 94.3 kg, SpO2 98 %.  Physical Exam Nursing noteand vitalsreviewed. Constitutional:sitting up in bedside chair, bright cheery affect, wiggling toes on both feet, better on L still, NAD HENT:  Head:Normocephalicand atraumatic.  Nose:Nose normal.  Mouth/Throat: Nooropharyngeal exudate.  Eyes:.conjugate gaze Neck:Normal range of motion.Neck supple. Cardiovascular:  RRR  Respiratory: CTA B/L with no wheezes, rales or rhonchi heard WG:NFAO, NT, ND, (+)BS Musculoskeletal:  General: No tendernessor edema.  Comments: UEs 5/5 in deltoids, biceps, WE, triceps, grip and finger abd B/L LEs- RLE HF 24-5, KE 4/5, DF 3/5, PF 1/5, EHL 2/5 better except PF LLE HF 2/5 (slightly stronger than R), KE 3/5, DF 3/5, PF 2/5, EHL 2/5- can wiggle toes now on R foot  Neurological: He is alertand oriented to person, place, and time. Nocranial nerve deficit. Patient is alert . Follows  commands. Oriented x3. Decreased sensation ONLY at R S1 on exam- tested dermatomes on arms, torso and legs- otherwise intact everywhere else. Flaccid partial paralysis Skin: Back incision is well healing- looks great Psych: bright affect   Assessment/Plan: 1. Functional deficits secondary to cauda equina syndrome  which require 3+ hours per day of interdisciplinary therapy in a comprehensive inpatient rehab setting.  Physiatrist is providing close team supervision and 24 hour management of active medical problems listed below.  Physiatrist and rehab team continue to assess barriers to discharge/monitor patient progress toward functional and medical goals  Care Tool:  Bathing    Body parts bathed by patient: Right arm, Left arm, Chest, Abdomen, Front perineal area, Right upper leg, Left upper leg, Face, Buttocks, Right lower leg, Left lower leg   Body parts bathed by helper: Buttocks     Bathing assist Assist Level: Set up assist     Upper Body Dressing/Undressing Upper body dressing   What is the patient wearing?: Pull over shirt, Orthosis Orthosis activity level: Performed by patient  Upper body assist Assist Level: Set up assist    Lower Body Dressing/Undressing Lower body dressing      What is the patient wearing?: Pants     Lower body assist Assist for lower body dressing: Set up assist     Toileting Toileting    Toileting assist Assist for toileting: Supervision/Verbal cueing     Transfers Chair/bed transfer  Transfers assist  Chair/bed transfer activity did not occur: Safety/medical concerns  Chair/bed transfer assist level: Contact Guard/Touching assist     Locomotion Ambulation   Ambulation assist   Ambulation activity did not occur: Safety/medical concerns  Assist level: 2 helpers Assistive device: Parallel bars Max distance: 5'   Walk 10 feet activity   Assist  Walk 10 feet activity did not occur: Safety/medical concerns         Walk 50 feet activity   Assist Walk 50 feet with 2 turns activity did not occur: Safety/medical concerns  Walk 150 feet activity   Assist Walk 150 feet activity did not occur: Safety/medical concerns         Walk 10 feet on uneven surface  activity   Assist Walk 10 feet on uneven surfaces activity did not occur: Safety/medical concerns         Wheelchair     Assist Will patient use wheelchair at discharge?: Yes Type of Wheelchair: Manual Wheelchair activity did not occur: Safety/medical concerns  Wheelchair assist level: Independent Max wheelchair distance: 150'    Wheelchair 50 feet with 2 turns activity    Assist    Wheelchair 50 feet with 2 turns activity did not occur: Safety/medical concerns   Assist Level: Independent   Wheelchair 150 feet activity     Assist  Wheelchair 150 feet activity did not occur: Safety/medical concerns   Assist Level: Independent   Blood pressure 124/77, pulse 87, temperature 98.8 F (37.1 C), temperature source Oral, resp. rate 17, weight 94.3 kg, SpO2 98 %.  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  10/31- some improvement in RLE noted  11/3- pt wants a "HEP" to do in room when not in therapy 2. Antithrombotics: -DVT/anticoagulation:SCDs. Check vascular study 10/28- has peroneal/post tibial DVT- since below knee, will start Lovenox 40 mg daily x 90 days from surgery; will recheck Dopplers next week 11/4- will order dopplers tomorrow 11/5- Dopplers ordered 11/6- Changed to Xarelto- since doesn't have insurance/easier to get AND has DVTs -antiplatelet therapy: N/A 3. Pain Management:Hydrocodone as needed  10/28- Start Gabapentin 300 mg TID- hopefully will kick in soon, Baclofen 5 mg TID and con't Norco prn  10/29- pain better  controlled- will increase nighttime Baclofen to 10 mg  11/3- will increase gabapentin to 600 mg BID and monitor for sleepiness 4. Mood:Provide emotional support -antipsychotic agents: N/A 5. Neuropsych: This patientiscapable of making decisions on hisown behalf. 6. Skin/Wound Care:Routine skin checks 7. Fluids/Electrolytes/Nutrition:Routine in and outs with follow-up chemistries 8. Neurogenicbowel andbladder. Need to discuss removing Foley tube and begin voiding trial- however since he had >1L of volume in bladder and bladder has been stretched, Urology usually asks [place] to wait ~ 1 week of bladder rest to remove foley- will discuss starting Flomax to maximize potential of pt voiding after foley removed. - pt likely has neurogenic bowel and is during spinal shock- gut doesn't work during spinal shock and needs laxatives daily, as well as suppository nightly as part of bowel program- if pt unable to go on his own today, will put in for Suppository (enemas usually don't work in cauda equina syndrome patients) for this evening.  10/30- emptying and voiding and having BMs- continent- no caths required in 24+ hrs. Will see if can stop Flomax last week of rehab. 9. Postoperative anemia. CBC from 11/2 shows  Hgb of 10.8. 10. SCI education- will educate pt on SCI issues during inpt rehab. Will likely need Rehab physician in [place], [place] and in [place] in future. 11. High BP- running 140s/80s- on no meds- said he was on no meds prior to admission- will monitor- could be pain; if doesn't improve, will start low dose meds.  11/3- now 130s/80s- better  11/5- BPs 120s/70s- much better    LOS: 11 days A FACE TO FACE EVALUATION WAS PERFORMED  Enza Shone 11/24/2018, 8:55 AM

## 2018-11-24 NOTE — Progress Notes (Signed)
Occupational Therapy Session Note  Patient Details  Name: Joseph Reeves MRN: [id number] Date of Birth: [birth year]  Today's Date: 11/24/2018 OT Individual Time: 1100-1200 and 1400-1455 OT Individual Time Calculation (min): 60 min and 55 min   Short Term Goals: Week 2:  OT Short Term Goal 1 (Week 2): STG=LTG due to LOS  Skilled Therapeutic Interventions/Progress Updates:    Session One: Pt seen for OT session focusing sit>stand and LE strengthening/endurance.  Pt sitting up in w/c upon arrival, agreeable to tx session and denying pain. Self-propelled w/c throughout unit mod I.  In ADL apartment, completed sit>stand at sink, CGA to power into standing pulling up on sink ledge. Pt maintaining static standing balance with CGA, heavy UE reliance with VCs to lessen UE support and weighshift on R/L evenly. Completed x4 in total with seated rest breaks btwn trials. Tolerating ~1 minute in standing before requiring seated rest break. Education provided regarding standing trials at home at sink for safety as part of HEP and for pressure relief.  In therapy gym, pt transferred via squat pivot onto mat mod I. Transitioned into prone for prolonged stretch. x5 hamstring curls from prone position, VCs for controlled movements, min A for R LE flexion, pt able to control back into extension. Seated EOM, completed x10 toe taps on target on R/L with emphasis on controlled movements.  x10 LAQ seated EOM.  Kicking ball seated on EOM with #3 ankle weight on each extremity for strengthening/control. Pt returned to room at end of session, opting to stay sitting up in w/c with all needs in reach.   Session Two: Pt seen for OT ADL bathing/dressing session. Pt sitting up in recliner upon arrival, denied pain and agreeable to tx session. Completed squat pivot transfers throughout session at [place] I level. Recliner>w/c and w/c> tub transfer bench in shower. He bathed seated on tub transfer bench with  set-up assist.  He gathered clothing items from w/c level mod I, adhering to back pre-cautions.  UB dressing including orthosis indep Pt threaded B LEs into pants, stood at sink with min A, increased proprioceptive input applied to quad at knee to facilitate extension. Pt able to pull up pants from standing position with steadying assist and blocking of R knee.  Grooming tasks completed from w/c level at sink mod I.  Completed x3 addition sit>stand trials, emphasis on forward weight shift over R knee and hand placement during sit<>stand. Pt tolerating standing ~1-2 minutes each trial before requiring seated rest break. Able to complete mini squats, bending and straightening R knee demonstrating improved control and stability in R LE.  Pt left sitting up in w/c at end of session, all needs in reach.      Therapy Documentation Precautions:  Precautions Precautions: Back, Fall Precaution Booklet Issued: No Required Braces or Orthoses: Spinal Brace Spinal Brace: Lumbar corset, Applied in sitting position Restrictions Weight Bearing Restrictions: No   Therapy/Group: Individual Therapy  [name] L 11/24/2018, 6:54 AM

## 2018-11-25 ENCOUNTER — Inpatient Hospital Stay (HOSPITAL_COMMUNITY): Payer: Self-pay

## 2018-11-25 IMAGING — CR DG LUMBAR SPINE 2-3V
2 series · 2 of 2 positions shown · non-contrast
Comparison: Lumbar spine radiographs [DATE]

CLINICAL DATA: [DATE] lumbar fusion. Pt with severe weakness, unable
to bear weight. Standing films. Motion due to 2 techs having to hold
patient, very unsteady. Did not repeat due to safety concerns.

EXAM:
LUMBAR SPINE - 2-3 VIEW

[l-spine ap]
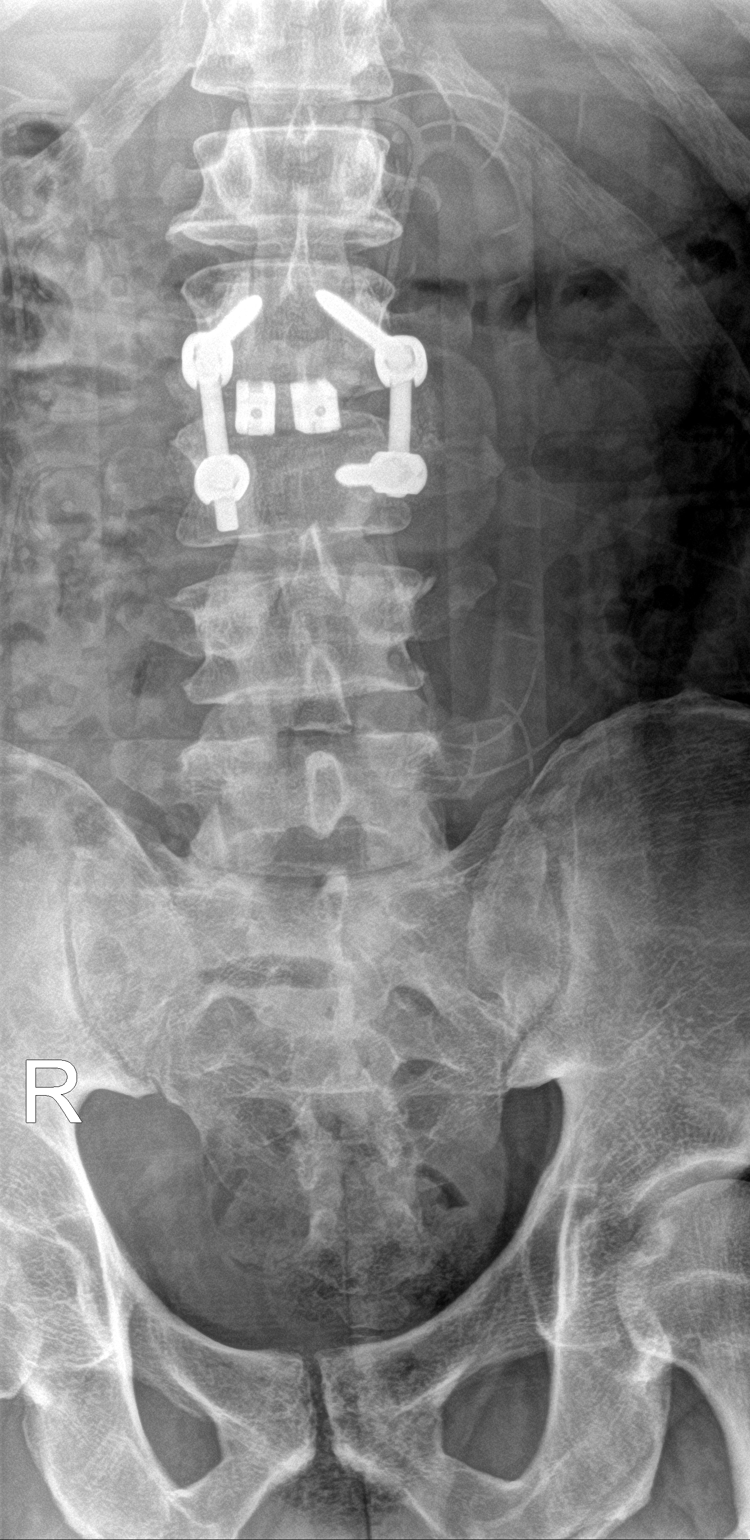

[l-spine lat]
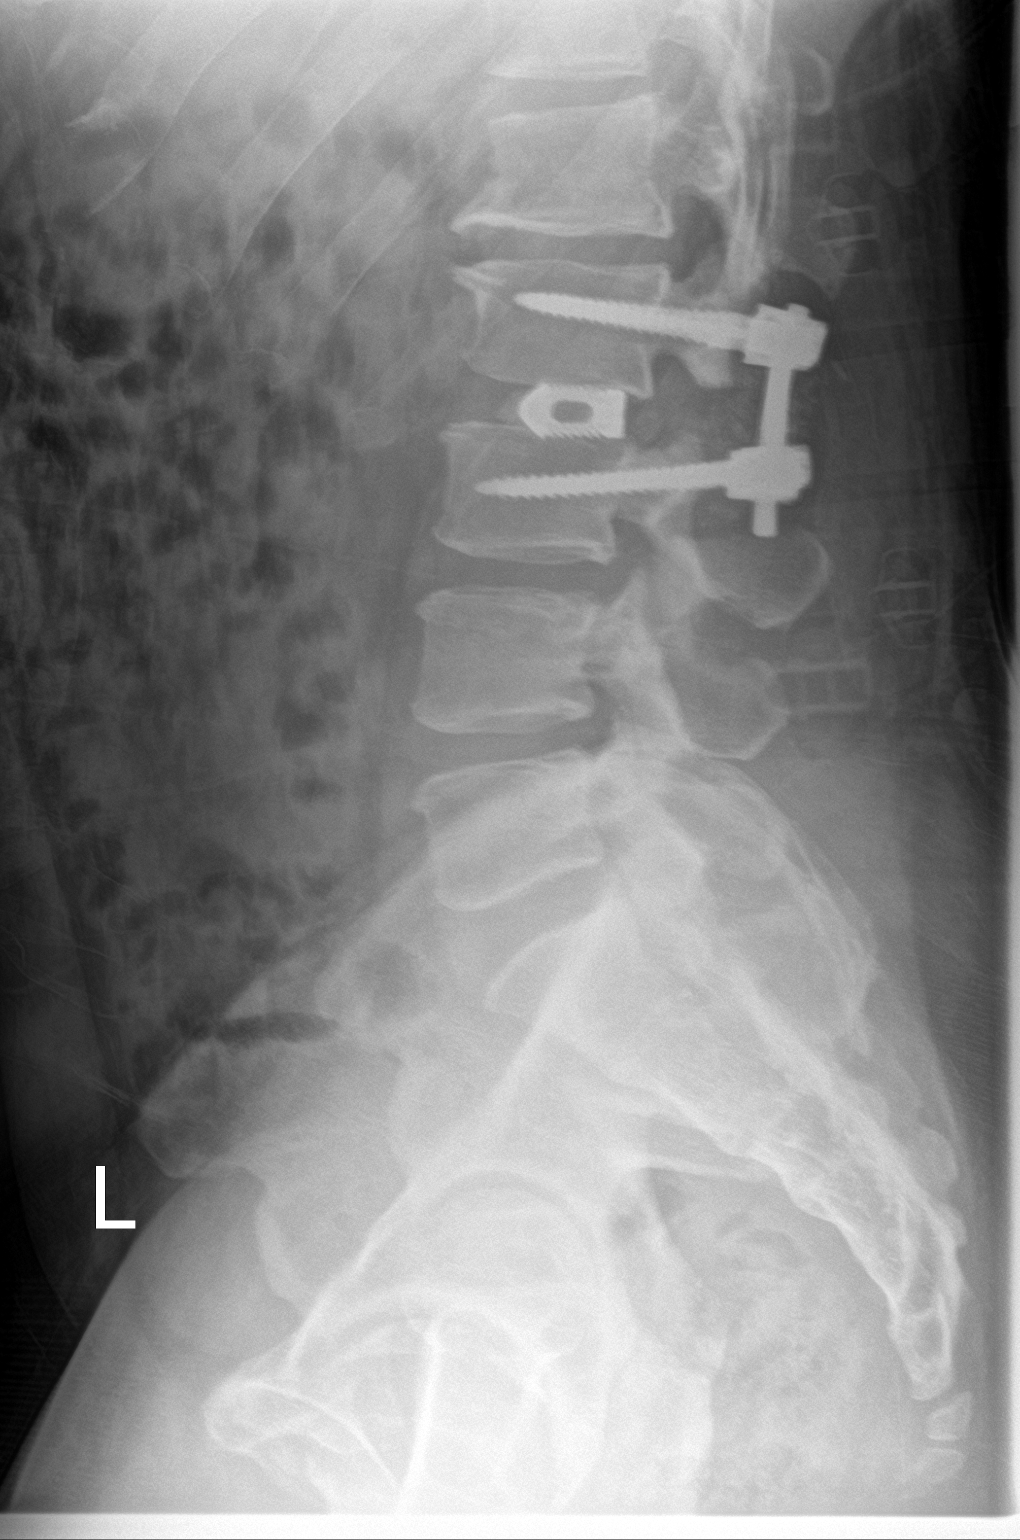

[2 of 2 positions shown; findings below may reference images not displayed]

FINDINGS: Posterior fixation with pedicle screws at L2-3 with interbody
spacer. Hardware appears intact. Stable alignment. Vertebral body
heights and intervertebral disc spaces are maintained. SI joints are
open. No acute finding in the visualized pelvis. Nonobstructive
bowel gas pattern.
IMPRESSION: Stable postoperative appearance with posterior fusion at L2-3. No
evidence of hardware complication or other acute finding.

## 2018-11-25 NOTE — Progress Notes (Signed)
[place] PHYSICAL MEDICINE & REHABILITATION PROGRESS NOTE   Subjective/Complaints:  Feels weak and numb below knee on RIght side, left side feels ok   ROS: denies CP, SOB, N/V/D Objective:   Vas [place] Lower Extremity Venous (dvt)  Result Date: 11/23/2018  Lower Venous Study Indications: Follow up.  Comparison Study: 11/14/18 previous Performing Technologist: Blanch Media RVS  Examination Guidelines: A complete evaluation includes B-mode imaging, spectral Doppler, color Doppler, and power Doppler as needed of all accessible portions of each vessel. Bilateral testing is considered an integral part of a complete examination. Limited examinations for reoccurring indications may be performed as noted.  +---------+---------------+---------+-----------+----------+--------------+ RIGHT    CompressibilityPhasicitySpontaneityPropertiesThrombus Aging +---------+---------------+---------+-----------+----------+--------------+ CFV      Full           Yes      Yes                                 +---------+---------------+---------+-----------+----------+--------------+ SFJ      Full                                                        +---------+---------------+---------+-----------+----------+--------------+ FV Prox  Full                                                        +---------+---------------+---------+-----------+----------+--------------+ FV Mid   Full                                                        +---------+---------------+---------+-----------+----------+--------------+ FV DistalFull                                                        +---------+---------------+---------+-----------+----------+--------------+ PFV      Full                                                        +---------+---------------+---------+-----------+----------+--------------+ POP      Full           Yes      Yes                  sluggish flow   +---------+---------------+---------+-----------+----------+--------------+ PTV      None                                         Acute          +---------+---------------+---------+-----------+----------+--------------+ PERO     None  Acute          +---------+---------------+---------+-----------+----------+--------------+   +---------+---------------+---------+-----------+----------+--------------+ LEFT     CompressibilityPhasicitySpontaneityPropertiesThrombus Aging +---------+---------------+---------+-----------+----------+--------------+ CFV      Full           Yes      Yes                                 +---------+---------------+---------+-----------+----------+--------------+ SFJ      Full                                                        +---------+---------------+---------+-----------+----------+--------------+ FV Prox  Full                                                        +---------+---------------+---------+-----------+----------+--------------+ FV Mid   Full                                                        +---------+---------------+---------+-----------+----------+--------------+ FV DistalFull                                                        +---------+---------------+---------+-----------+----------+--------------+ PFV      Full                                                        +---------+---------------+---------+-----------+----------+--------------+ POP      Full           Yes      Yes                  sluggish flow  +---------+---------------+---------+-----------+----------+--------------+ PTV      Full                                                        +---------+---------------+---------+-----------+----------+--------------+ PERO     Partial                                      Acute           +---------+---------------+---------+-----------+----------+--------------+     Summary: Right: Findings consistent with acute deep vein thrombosis involving the right posterior tibial veins, and right peroneal veins. No cystic structure found in the popliteal fossa. Left: Findings consistent with acute deep vein thrombosis involving the left peroneal veins. No cystic structure found in the popliteal fossa.  *  See table(s) above for measurements and observations. Electronically signed by Sherald Hess MD on 11/23/2018 at 3:37:52 PM.    Final    No results for input(s): WBC, HGB, HCT, PLT in the last 72 hours. No results for input(s): NA, K, CL, CO2, GLUCOSE, BUN, CREATININE, CALCIUM in the last 72 hours.  Intake/Output Summary (Last 24 hours) at 11/25/2018 0650 Last data filed at 11/25/2018 0400 Gross per 24 hour  Intake 720 ml  Output 1150 ml  Net -430 ml     Physical Exam: Vital Signs Blood pressure 123/69, pulse 82, temperature 98.5 F (36.9 C), temperature source Oral, resp. rate 16, weight 94.3 kg, SpO2 99 %.  Physical Exam Nursing noteand vitalsreviewed. Constitutional:sitting up in bedside chair, bright cheery affect, wiggling toes on both feet, better on L still, NAD HENT:  Head:Normocephalicand atraumatic.  Nose:Nose normal.  Mouth/Throat: Nooropharyngeal exudate.  Eyes:.conjugate gaze Neck:Normal range of motion.Neck supple. Cardiovascular:  RRR  Respiratory: CTA B/L with no wheezes, rales or rhonchi heard JX:BJYN, NT, ND, (+)BS Musculoskeletal:  General: No tendernessor edema.  Comments: UEs 5/5 in deltoids, biceps, WE, triceps, grip and finger abd B/L LEs- RLE HF 24-5, KE 4/5, DF 3/5, PF 1/5, EHL 2/5 better except PF LLE HF 2/5 (slightly stronger than R), KE 3/5, DF 3/5, PF 2/5, EHL 2/5- can wiggle toes now on R foot  Neurological: He is alertand oriented to person, place, and time. Nocranial nerve deficit. Patient is alert . Follows  commands. Oriented x3. Decreased sensation ONLY at R S1 on exam- tested dermatomes on arms, torso and legs- otherwise intact everywhere else. Flaccid partial paralysis Skin: Back incision is well healing- looks great Psych: bright affect   Assessment/Plan: 1. Functional deficits secondary to cauda equina syndrome  which require 3+ hours per day of interdisciplinary therapy in a comprehensive inpatient rehab setting.  Physiatrist is providing close team supervision and 24 hour management of active medical problems listed below.  Physiatrist and rehab team continue to assess barriers to discharge/monitor patient progress toward functional and medical goals  Care Tool:  Bathing    Body parts bathed by patient: Right arm, Left arm, Chest, Abdomen, Front perineal area, Right upper leg, Left upper leg, Face, Buttocks, Right lower leg, Left lower leg   Body parts bathed by helper: Buttocks     Bathing assist Assist Level: Set up assist     Upper Body Dressing/Undressing Upper body dressing   What is the patient wearing?: Pull over shirt, Orthosis Orthosis activity level: Performed by patient  Upper body assist Assist Level: Independent    Lower Body Dressing/Undressing Lower body dressing      What is the patient wearing?: Pants     Lower body assist Assist for lower body dressing: Minimal Assistance - Patient > 75%     Toileting Toileting    Toileting assist Assist for toileting: Supervision/Verbal cueing     Transfers Chair/bed transfer  Transfers assist  Chair/bed transfer activity did not occur: Safety/medical concerns  Chair/bed transfer assist level: Supervision/Verbal cueing     Locomotion Ambulation   Ambulation assist   Ambulation activity did not occur: Safety/medical concerns  Assist level: 2 helpers Assistive device: Parallel bars Max distance: 5'   Walk 10 feet activity   Assist  Walk 10 feet activity did not occur: Safety/medical  concerns        Walk 50 feet activity   Assist Walk 50 feet with 2 turns activity did not occur: Safety/medical concerns  Walk 150 feet activity   Assist Walk 150 feet activity did not occur: Safety/medical concerns         Walk 10 feet on uneven surface  activity   Assist Walk 10 feet on uneven surfaces activity did not occur: Safety/medical concerns         Wheelchair     Assist Will patient use wheelchair at discharge?: Yes Type of Wheelchair: Manual Wheelchair activity did not occur: Safety/medical concerns  Wheelchair assist level: Independent Max wheelchair distance: 150'    Wheelchair 50 feet with 2 turns activity    Assist    Wheelchair 50 feet with 2 turns activity did not occur: Safety/medical concerns   Assist Level: Independent   Wheelchair 150 feet activity     Assist  Wheelchair 150 feet activity did not occur: Safety/medical concerns   Assist Level: Independent   Blood pressure 123/69, pulse 82, temperature 98.5 F (36.9 C), temperature source Oral, resp. rate 16, weight 94.3 kg, SpO2 99 %.  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  10/31- some improvement in RLE noted  11/3- pt wants a "HEP" to do in room when not in therapy 2. Antithrombotics: -DVT/ 10/28- has R  peroneal/post tibial , Left peroneal DVT- 11/6- Changed to Xarelto- -antiplatelet therapy: N/A 3. Pain Management:Hydrocodone as needed  10/28- Start Gabapentin 300 mg TID- hopefully will kick in soon, Baclofen 5 mg TID and con't Norco prn  10/29- pain better controlled- will increase nighttime Baclofen to 10 mg  11/3- will increase gabapentin to 600 mg BID and monitor for sleepiness 4. Mood:Provide emotional support -antipsychotic agents: N/A 5. Neuropsych: This  patientiscapable of making decisions on hisown behalf. 6. Skin/Wound Care:Routine skin checks 7. Fluids/Electrolytes/Nutrition:Routine in and outs with follow-up chemistries 8. Neurogenicbowel andbladder. Need to discuss removing Foley tube and begin voiding trial- however since he had >1L of volume in bladder and bladder has been stretched, Urology usually asks us to wait ~ 1 week of bladder rest to remove foley- will discuss starting Flomax to maximize potential of pt voiding after foley removed. - pt likely has neurogenic bowel and is during spinal shock- gut doesn't work during spinal shock and needs laxatives daily, as well as suppository nightly as part of bowel program- if pt unable to go on his own today, will put in for Suppository (enemas usually don't work in cauda equina syndrome patients) for this evening.  10/30- emptying and voiding and having BMs- continent- no caths required in 24+ hrs. Will see if can stop Flomax last week of rehab. 9. Postoperative anemia. CBC from 11/2 shows  Hgb of 10.8. 10. SCI education- will educate pt on SCI issues during inpt rehab. [place] and in [place] in future. 11. BP controlled. Normal  Vitals:   11/24/18 2043 11/25/18 0409  BP: 118/65 123/69  Pulse: 81 82  Resp: 16 16  Temp: 98.6 F (37 C) 98.5 F (36.9 C)  SpO2: 100% 99%      LOS: 12 days A FACE TO FACE EVALUATION WAS PERFORMED  Erick Colacendrew E Kirsteins 11/25/2018, 6:50 AM

## 2018-11-25 NOTE — Plan of Care (Signed)
  Problem: Consults Goal: RH GENERAL PATIENT EDUCATION Description: See Patient Education module for education specifics. Outcome: Progressing Goal: Skin Care Protocol Initiated - if Braden Score 18 or less Description: If consults are not indicated, leave blank or document N/A Outcome: Progressing   Problem: RH BOWEL ELIMINATION Goal: RH STG MANAGE BOWEL WITH ASSISTANCE Description: STG Manage Bowel with mod I Assistance. Outcome: Progressing Goal: RH STG MANAGE BOWEL W/MEDICATION W/ASSISTANCE Description: STG Manage Bowel with Medication with mod I Assistance. Outcome: Progressing   Problem: RH BLADDER ELIMINATION Goal: RH STG MANAGE BLADDER WITH ASSISTANCE Description: STG Manage Bladder With min Assistance Outcome: Progressing Goal: RH STG MANAGE BLADDER WITH EQUIPMENT WITH ASSISTANCE Description: STG Manage Bladder With Equipment With min Assistance Outcome: Progressing   Problem: RH SAFETY Goal: RH STG ADHERE TO SAFETY PRECAUTIONS W/ASSISTANCE/DEVICE Description: STG Adhere to Safety Precautions With supervision Assistance/Device. Outcome: Progressing   Problem: RH PAIN MANAGEMENT Goal: RH STG PAIN MANAGED AT OR BELOW PT'S PAIN GOAL Description: Less than 4 on 0-10 scale Outcome: Progressing   Problem: RH KNOWLEDGE DEFICIT GENERAL Goal: RH STG INCREASE KNOWLEDGE OF SELF CARE AFTER HOSPITALIZATION Description: Pt will identify safety and spinal precautions along with medication compliance to manage bowel function prior to DC  Outcome: Progressing   

## 2018-11-26 ENCOUNTER — Inpatient Hospital Stay (HOSPITAL_COMMUNITY): Payer: Self-pay | Admitting: Occupational Therapy

## 2018-11-26 NOTE — Progress Notes (Signed)
Occupational Therapy Session Note  Patient Details  Name: Joseph Reeves MRN: [id number] Date of Birth: [birth year]  Today's Date: 11/26/2018 OT Individual Time: 9150-5697 OT Individual Time Calculation (min): 74 min    Short Term Goals: Week 2:  OT Short Term Goal 1 (Week 2): STG=LTG due to LOS  Skilled Therapeutic Interventions/Progress Updates:    Patient seated in recliner, alert and aware of needs.  stedy to/from recliner, toilet, shower bench and bed - sit to stand in stedy with stand by and set up assist.  Patient able to complete toileting with set up in stedy.  Bathing completed with set up, seated on shower bench.  Dressing (to include LSO) completed with set up seated edge of bed, patient able to complete clothing management with lateral leans, he is able to donn teds and socks seated edge of bed with set up.  Sit pivot transfer to w/c with set up.  Grooming tasks at sink in seated position mod I.  He is able to propel w/c to from therapy gym, completed kinetron for 5 minutes.  Completed modified squat/wc push ups, quad exercises with fatigue and limited ability to control for sit to stand.  Returned to room, remained seated in w/c, able to access call bell and tray table.   Therapy Documentation Precautions:  Precautions Precautions: Back, Fall Precaution Booklet Issued: No Required Braces or Orthoses: Spinal Brace Spinal Brace: Lumbar corset, Applied in sitting position Restrictions Weight Bearing Restrictions: No General:   Vital Signs: Therapy Vitals Temp: 98.1 F (36.7 C) Temp Source: Oral Pulse Rate: 78 Resp: 16 BP: (!) 111/50 Patient Position (if appropriate): Lying Oxygen Therapy SpO2: 99 % O2 Device: Room Air Pain: Pain Assessment Pain Scale: 0-10 Pain Score: 0-No pain Other Treatments:     Therapy/Group: Individual Therapy  Carlos Levering 11/26/2018, 9:12 AM

## 2018-11-26 NOTE — Progress Notes (Signed)
[facility] PHYSICAL MEDICINE & REHABILITATION PROGRESS NOTE   Subjective/Complaints:  Calf feels weak on RIght but no pain, feels numbness in RIght foot  ROS: denies CP, SOB, N/V/D Objective:   Dg Lumbar Spine 2-3 Views  Result Date: 11/25/2018 CLINICAL DATA:  10/22 lumbar fusion. Pt with severe weakness, unable to bear weight. Standing films. Motion due to 2 techs having to hold patient, very unsteady. Did not repeat due to safety concerns. EXAM: LUMBAR SPINE - 2-3 VIEW COMPARISON:  Lumbar spine radiographs 11/09/2018 FINDINGS: Posterior fixation with pedicle screws at L2-3 with interbody spacer. Hardware appears intact. Stable alignment. Vertebral body heights and intervertebral disc spaces are maintained. SI joints are open. No acute finding in the visualized pelvis. Nonobstructive bowel gas pattern. IMPRESSION: Stable postoperative appearance with posterior fusion at L2-3. No evidence of hardware complication or other acute finding. Electronically Signed   By: Emmaline KluverNancy  Ballantyne M.D.   On: 11/25/2018 11:10   No results for input(s): WBC, HGB, HCT, PLT in the last 72 hours. No results for input(s): NA, K, CL, CO2, GLUCOSE, BUN, CREATININE, CALCIUM in the last 72 hours.  Intake/Output Summary (Last 24 hours) at 11/26/2018 0703 Last data filed at 11/26/2018 0216 Gross per 24 hour  Intake 838 ml  Output 1400 ml  Net -562 ml     Physical Exam: Vital Signs Blood pressure (!) 111/50, pulse 78, temperature 98.1 F (36.7 C), temperature source Oral, resp. rate 16, weight 94.3 kg, SpO2 99 %.  Physical Exam Nursing noteand vitalsreviewed. Constitutional:sitting up in bedside chair, bright cheery affect, wiggling toes on both feet, better on L still, NAD HENT:  Head:Normocephalicand atraumatic.  Nose:Nose normal.  Mouth/Throat: Nooropharyngeal exudate.  Eyes:.conjugate gaze Neck:Normal range of motion.Neck supple. Cardiovascular:  RRR  Respiratory: CTA B/L with no wheezes,  rales or rhonchi heard ZO:XWRUGI:soft, NT, ND, (+)BS Musculoskeletal:  General: No tendernessor edema.  Comments: UEs 5/5 in deltoids, biceps, WE, triceps, grip and finger abd B/L LEs- RLE HF 2-5, KE 4/5, DF 3/5, PF 1/5, EHL 2/5 LLE HF 3+/5 stronger than R), KE 3/5, DF 3/5, PF 3/5, EHL 2/5- can wiggle toes now on R foot  Neurological: He is alertand oriented to person, place, and time. Nocranial nerve deficit. Patient is alert . Follows commands. Oriented x3. Decreased sensation to deep pressure but has intackt LT in LEs- tested dermatomes on arms, torso and legs- otherwise intact everywhere else. Flaccid partial paralysis  Psych: bright affect   Assessment/Plan: 1. Functional deficits secondary to cauda equina syndrome  which require 3+ hours per day of interdisciplinary therapy in a comprehensive inpatient rehab setting.  Physiatrist is providing close team supervision and 24 hour management of active medical problems listed below.  Physiatrist and rehab team continue to assess barriers to discharge/monitor patient progress toward functional and medical goals  Care Tool:  Bathing    Body parts bathed by patient: Right arm, Left arm, Chest, Abdomen, Front perineal area, Right upper leg, Left upper leg, Face, Buttocks, Right lower leg, Left lower leg   Body parts bathed by helper: Buttocks     Bathing assist Assist Level: Set up assist     Upper Body Dressing/Undressing Upper body dressing   What is the patient wearing?: Pull over shirt, Orthosis Orthosis activity level: Performed by patient  Upper body assist Assist Level: Independent    Lower Body Dressing/Undressing Lower body dressing      What is the patient wearing?: Pants     Lower body assist Assist  for lower body dressing: Minimal Assistance - Patient > 75%     Toileting Toileting    Toileting assist Assist for toileting: Supervision/Verbal cueing     Transfers Chair/bed  transfer  Transfers assist  Chair/bed transfer activity did not occur: Safety/medical concerns  Chair/bed transfer assist level: Supervision/Verbal cueing     Locomotion Ambulation   Ambulation assist   Ambulation activity did not occur: Safety/medical concerns  Assist level: 2 helpers Assistive device: Parallel bars Max distance: 5'   Walk 10 feet activity   Assist  Walk 10 feet activity did not occur: Safety/medical concerns        Walk 50 feet activity   Assist Walk 50 feet with 2 turns activity did not occur: Safety/medical concerns         Walk 150 feet activity   Assist Walk 150 feet activity did not occur: Safety/medical concerns         Walk 10 feet on uneven surface  activity   Assist Walk 10 feet on uneven surfaces activity did not occur: Safety/medical concerns         Wheelchair     Assist Will patient use wheelchair at discharge?: Yes Type of Wheelchair: Manual Wheelchair activity did not occur: Safety/medical concerns  Wheelchair assist level: Independent Max wheelchair distance: 150'    Wheelchair 50 feet with 2 turns activity    Assist    Wheelchair 50 feet with 2 turns activity did not occur: Safety/medical concerns   Assist Level: Independent   Wheelchair 150 feet activity     Assist  Wheelchair 150 feet activity did not occur: Safety/medical concerns   Assist Level: Independent   Blood pressure (!) 111/50, pulse 78, temperature 98.1 F (36.7 C), temperature source Oral, resp. rate 16, weight 94.3 kg, SpO2 99 %.  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  10/31- some improvement in RLE noted  11/7 Xray LS spine, fusion L2-3 normal post op appearance  2. Antithrombotics: -DVT/ 10/28- has R  peroneal/post tibial , Left peroneal DVT- 11/6- Changed to  Xarelto- -antiplatelet therapy: N/A 3. Pain Management:Hydrocodone as needed  10/28- Start Gabapentin 300 mg TID- hopefully will kick in soon, Baclofen 5 mg TID and con't Norco prn  10/29- pain better controlled- will increase nighttime Baclofen to 10 mg  11/3- will increase gabapentin to 600 mg BID and monitor for sleepiness 4. Mood:Provide emotional support -antipsychotic agents: N/A 5. Neuropsych: This patientiscapable of making decisions on hisown behalf. 6. Skin/Wound Care:Routine skin checks 7. Fluids/Electrolytes/Nutrition:Routine in and outs with follow-up chemistries 8. Neurogenicbowel andbladder. Need to discuss removing Foley tube and begin voiding trial- however since he had >1L of volume in bladder and bladder has been stretched, Urology usually asks [place] to wait ~ 1 week of bladder rest to remove foley- will discuss starting Flomax to maximize potential of pt voiding after foley removed. - pt likely has neurogenic bowel and is during spinal shock- gut doesn't work during spinal shock and needs laxatives daily, as well as suppository nightly as part of bowel program- if pt unable to go on his own today, will put in for Suppository (enemas usually don't work in cauda equina syndrome patients) for this evening.  10/30- emptying and voiding and having BMs- continent- no caths required in 24+ hrs. Will see if can stop Flomax last week of rehab. 9. Postoperative anemia. CBC from 11/2 shows  Hgb of 10.8. 10. SCI education- will educate pt on SCI issues during inpt rehab. Will likely need Rehab physician in [place], [place] and in [place] in future. 11. BP controlled. Normal 11/8  Vitals:   11/25/18 1947 11/26/18 0528  BP: 125/73 (!) 111/50  Pulse: 90 78  Resp: 16 16  Temp: 98.2 F (36.8 C) 98.1 F (36.7 C)  SpO2: 99% 99%      LOS: 13 days A FACE TO FACE EVALUATION WAS PERFORMED  Luanna Salk Sharma Lawrance 11/26/2018, 7:03 AM

## 2018-11-26 NOTE — Plan of Care (Signed)
  Problem: Consults Goal: RH GENERAL PATIENT EDUCATION Description: See Patient Education module for education specifics. Outcome: Progressing Goal: Skin Care Protocol Initiated - if Braden Score 18 or less Description: If consults are not indicated, leave blank or document N/A Outcome: Progressing   Problem: RH BOWEL ELIMINATION Goal: RH STG MANAGE BOWEL WITH ASSISTANCE Description: STG Manage Bowel with mod I Assistance. Outcome: Progressing Goal: RH STG MANAGE BOWEL W/MEDICATION W/ASSISTANCE Description: STG Manage Bowel with Medication with mod I Assistance. Outcome: Progressing   Problem: RH BLADDER ELIMINATION Goal: RH STG MANAGE BLADDER WITH ASSISTANCE Description: STG Manage Bladder With min Assistance Outcome: Progressing Goal: RH STG MANAGE BLADDER WITH EQUIPMENT WITH ASSISTANCE Description: STG Manage Bladder With Equipment With min Assistance Outcome: Progressing   Problem: RH SAFETY Goal: RH STG ADHERE TO SAFETY PRECAUTIONS W/ASSISTANCE/DEVICE Description: STG Adhere to Safety Precautions With supervision Assistance/Device. Outcome: Progressing   Problem: RH PAIN MANAGEMENT Goal: RH STG PAIN MANAGED AT OR BELOW PT'S PAIN GOAL Description: Less than 4 on 0-10 scale Outcome: Progressing   Problem: RH KNOWLEDGE DEFICIT GENERAL Goal: RH STG INCREASE KNOWLEDGE OF SELF CARE AFTER HOSPITALIZATION Description: Pt will identify safety and spinal precautions along with medication compliance to manage bowel function prior to DC  Outcome: Progressing   

## 2018-11-27 ENCOUNTER — Inpatient Hospital Stay (HOSPITAL_COMMUNITY): Payer: Self-pay | Admitting: Occupational Therapy

## 2018-11-27 ENCOUNTER — Inpatient Hospital Stay (HOSPITAL_COMMUNITY): Payer: Self-pay

## 2018-11-27 LAB — CBC WITH DIFFERENTIAL/PLATELET
Abs Immature Granulocytes: 0.01 10*3/uL (ref 0.00–0.07)
Basophils Absolute: 0 10*3/uL (ref 0.0–0.1)
Basophils Relative: 1 %
Eosinophils Absolute: 0.1 10*3/uL (ref 0.0–0.5)
Eosinophils Relative: 3 %
HCT: 34.8 % — ABNORMAL LOW (ref 39.0–52.0)
Hemoglobin: 11.1 g/dL — ABNORMAL LOW (ref 13.0–17.0)
Immature Granulocytes: 0 %
Lymphocytes Relative: 29 %
Lymphs Abs: 1.2 10*3/uL (ref 0.7–4.0)
MCH: 31.2 pg (ref 26.0–34.0)
MCHC: 31.9 g/dL (ref 30.0–36.0)
MCV: 97.8 fL (ref 80.0–100.0)
Monocytes Absolute: 0.5 10*3/uL (ref 0.1–1.0)
Monocytes Relative: 11 %
Neutro Abs: 2.4 10*3/uL (ref 1.7–7.7)
Neutrophils Relative %: 56 %
Platelets: 429 10*3/uL — ABNORMAL HIGH (ref 150–400)
RBC: 3.56 MIL/uL — ABNORMAL LOW (ref 4.22–5.81)
RDW: 12.1 % (ref 11.5–15.5)
WBC: 4.3 10*3/uL (ref 4.0–10.5)
nRBC: 0 % (ref 0.0–0.2)

## 2018-11-27 LAB — COMPREHENSIVE METABOLIC PANEL
ALT: 36 U/L (ref 0–44)
AST: 14 U/L — ABNORMAL LOW (ref 15–41)
Albumin: 3.1 g/dL — ABNORMAL LOW (ref 3.5–5.0)
Alkaline Phosphatase: 52 U/L (ref 38–126)
Anion gap: 8 (ref 5–15)
BUN: 17 mg/dL (ref 6–20)
CO2: 28 mmol/L (ref 22–32)
Calcium: 9.2 mg/dL (ref 8.9–10.3)
Chloride: 103 mmol/L (ref 98–111)
Creatinine, Ser: 1.1 mg/dL (ref 0.61–1.24)
GFR calc Af Amer: >60 mL/min (ref >60)
GFR calc non Af Amer: >60 mL/min (ref >60)
Glucose, Bld: 119 mg/dL — ABNORMAL HIGH (ref 70–99)
Potassium: 4.1 mmol/L (ref 3.5–5.1)
Sodium: 139 mmol/L (ref 135–145)
Total Bilirubin: 0.4 mg/dL (ref 0.3–1.2)
Total Protein: 6.4 g/dL — ABNORMAL LOW (ref 6.5–8.1)

## 2018-11-27 NOTE — Progress Notes (Signed)
Physical Therapy Session Note  Patient Details  Name: Joseph Reeves MRN: [id number] Date of Birth: [birth year]  Today's Date: 11/27/2018 PT Individual Time: 0800-0915 PT Individual Time Calculation (min): 75 min   Short Term Goals: Week 2:  PT Short Term Goal 1 (Week 2): =LTG due to ELOS  Skilled Therapeutic Interventions/Progress Updates:    Pt received seated in recliner in room, agreeable to PT session. No complaints of pain. Squat pivot transfer recliner to w/c at mod I level. Pt is mod I for manual w/c propulsion x 200 ft with use of BUE. Sit to stand with min A to Lite Gait, dependent for donning of harness. Pt is able to step up onto treadmill from floor while in Lite Gait harness. Ambulation x 90', x 109', x 138', x 136' on treadmill at 0.5 mph while in overhead body weight supported system of Lite Gait with BUE support, seated rest breaks between each bout of ambulation. Pt exhibits good ability to laterally weight shift during gait as well as good ability to advance RLE during gait. Pt exhibits good R quad control in stance phase with no R knee buckling. Pt also exhibits good heel strike on RLE with decrease in LE clearance with onset of fatigue. Pt does exhibit ongoing impaired proprioception in RLE with visual cues required for RLE placement when stepping with some hip adduction and narrow BOS noted. Pt also exhibits R glute weakness and relies heavily on BUE to maintain upright stance with fair ability to extend hips. Discussed exercises to be reviewed prior to d/c for glute strengthening. Pt is able to step down backwards off of treadmill to the floor while in Lite Gait with min A and manual cueing to sit safely back in w/c. Pt left seated up in w/c in room with needs in reach at end of session.  Therapy Documentation Precautions:  Precautions Precautions: Back, Fall Precaution Booklet Issued: No Required Braces or Orthoses: Spinal Brace Spinal Brace: Lumbar corset, Applied in  sitting position Restrictions Weight Bearing Restrictions: No    Therapy/Group: Individual Therapy   Excell Seltzer, PT, DPT  11/27/2018, 11:58 AM

## 2018-11-27 NOTE — Progress Notes (Signed)
Occupational Therapy Session Note  Patient Details  Name: Joseph Reeves MRN: [id number] Date of Birth: [birth year]  Today's Date: 11/27/2018 OT Individual Time: 4010-2725 and 3664-4034 OT Individual Time Calculation (min): 70 min and 50 min OT Missed Time: 10 min (pt fatigue)   Short Term Goals: Week 2:  OT Short Term Goal 1 (Week 2): STG=LTG due to LOS  Skilled Therapeutic Interventions/Progress Updates:    Session One: Pt seen for OT session focusing on ADL re-training and community mobility from w/c level. Pt sitting up in w/c upon arrival, denying pain and agreeable to tx session. Pt requesting to cut hair at sink.  He completed grooming tasks and hair cutting from w/c level with assist for set-up. Throughout task, discussed d/c planning, return to activity and community mobility.  Pt self propelled w/c throughout unit mod I. Taken to outside courtyard, practiced w/c navigation and management over uneven terrain and up/down ramps and slopes. Completed squat pivot transfer w/c<> standard part bench and w/c<> chair at out door table at supervision-mod I level. Education/discussion regarding community accessibility from w/c level, factors to consider, etc. Pt returned to room. Completed x2 sit>stand from w/c at sink with blocking and proprioceptive pressure applied to R knee to aid in extension upon sit>stand. Min A standing balance overall with R knee blocked. Pt able to brush hair while standing at sink, alternating UE support.  Pt left sitting up in w/c at end of session, all needs in reach.  Session Two: Pt seen for OT ADL bathing/dressing session. Pt sitting up in w/c upon arrival, denied pain and agreeable to tx session.  He gathered clothing items and supplies in prep for shower from w/c level, good safety awareness and management of w/c brakes throughout functional tasks.  Squat pivot transfer w/c<> padded tub bench with supervision. He bathed with set-up from seated position,  lateral leans for buttock hygiene.   Returned to EOB to dress, mod I LB dressing seated EOB with lateral leans for clothing management. Independent UB dressing.  Grooming tasks from w/c level at sink mod I. In ADL apartment, completed simulated tub/shower transfer utilizing tub bench. Completed with supervision following VCs/demonstration for technique. Pt returned to room at end of session, declined any more tx at this time 2/2 fatigue. Left seated in recliner at end of session, all needs in reach.   Therapy Documentation Precautions:  Precautions Precautions: Back, Fall Precaution Booklet Issued: No Required Braces or Orthoses: Spinal Brace Spinal Brace: Lumbar corset, Applied in sitting position Restrictions Weight Bearing Restrictions: No   Therapy/Group: Individual Therapy  Corleen Otwell L 11/27/2018, 7:06 AM

## 2018-11-27 NOTE — Progress Notes (Signed)
Subjective: Patient sitting up in chair, comfortable.  Has been continuing rehabilitation including PT and OT.  He is 2-1/2 weeks status post status post an L2-3 lumbar decompression including laminectomy, facetectomy, and foraminotomies, and bilateral L2-3 posterior lumbar interbody arthrodesis with interbody implants and bone graft and bilateral L2-3 posterior lateral arthrodesis with posterior instrumentation and bone graft.  Follow-up x-rays 2 days ago showed interbody implants in good position, screws and rods in good position, and overall construct looked good.  Patient and rehab staff explained to me that they are working towards probable discharge towards the end of the week.  He explains that he may stay with a friend locally for a week or so and then head back to [place].  He got his sister [place] on the phone, and I discussed with her that he will certainly need to consult with and be followed by a spine surgeon in the [place] area, either neurosurgery or orthopedic, as well as by a physiatrist ([facility]), and she says that she will work on making arrangements for that.  Objective: Vital signs in last 24 hours: Vitals:   11/26/18 0528 11/26/18 1505 11/26/18 1919 11/27/18 0525  BP: (!) 111/50 136/73 123/72 115/66  Pulse: 78 95 89 82  Resp: 16 18 16 16   Temp: 98.1 F (36.7 C) 98.4 F (36.9 C) 98.9 F (37.2 C) 98.5 F (36.9 C)  TempSrc: Oral  Oral Oral  SpO2: 99% 99% 100% 98%  Weight:        Intake/Output from previous day: 11/08 0701 - 11/09 0700 In: 838 [P.O.:838] Out: 650 [Urine:650] Intake/Output this shift: No intake/output data recorded.  Physical Exam: Continued significant weakness in the distal right lower extremity: Right iliopsoas 4 -, right quadriceps 4+, no dorsiflexion or plantarflexion, but he does have moderately good strength in inversion of the right ankle.  CBC Recent Labs    11/27/18 0630  WBC 4.3  HGB 11.1*  HCT 34.8*  PLT 429*    BMET Recent Labs    11/27/18 0630  NA 139  K 4.1  CL 103  CO2 28  GLUCOSE 119*  BUN 17  CREATININE 1.10  CALCIUM 9.2    Assessment/Plan: Continue to do well from a neurosurgical perspective with gradual neurologic recovery.  We discussed again that the ultimate extent of neurologic recovery is uncertain, but that continued work with rehabilitation is going to be critical for his recovery.  We did discuss follow-ups once he returns to [place].  I discussed with Marlowe Shores, PA with inpatient rehab, that the patient will need a copy of my operative report, along with a CD of the x-rays from 2 days ago and his preoperative MRI.  The patient's questions were answered for him.  Hosie Spangle, MD 11/27/2018, 12:36 PM

## 2018-11-27 NOTE — Progress Notes (Signed)
PHYSICAL MEDICINE & REHABILITATION PROGRESS NOTE   Subjective/Complaints:  Pt reports still has some numbness in RLE; also c/o some swelling/edema in RLEt  ROS: denies CP, SOB, N/V/D Objective:   No results found. Recent Labs    11/27/18 0630  WBC 4.3  HGB 11.1*  HCT 34.8*  PLT 429*   Recent Labs    11/27/18 0630  NA 139  K 4.1  CL 103  CO2 28  GLUCOSE 119*  BUN 17  CREATININE 1.10  CALCIUM 9.2    Intake/Output Summary (Last 24 hours) at 11/27/2018 1311 Last data filed at 11/26/2018 2310 Gross per 24 hour  Intake 299 ml  Output 350 ml  Net -51 ml     Physical Exam: Vital Signs Blood pressure 115/66, pulse 82, temperature 98.5 F (36.9 C), temperature source Oral, resp. rate 16, weight 94.3 kg, SpO2 98 %.  Physical Exam Nursing noteand vitalsreviewed. Constitutional:sitting up in bedside chair again, LBM this AM, bright cheery affect, wiggling toes on both feet, better on L still, NAD HENT:  Head:Normocephalicand atraumatic.  Nose:Nose normal.  Mouth/Throat: Nooropharyngeal exudate.  Eyes:.conjugate gaze Neck:Normal range of motion.Neck supple. Cardiovascular:  RRR  Respiratory: CTA B/L with no wheezes, rales or rhonchi heard WU:JWJXGI:soft, NT, ND, (+)BS Musculoskeletal:  General: No tenderness; mild/trace edema of R foot/ankle, esp lateral ankle.  Comments: UEs 5/5 in deltoids, biceps, WE, triceps, grip and finger abd B/L LEs- RLE HF 2-5, KE 4/5, DF 3/5, PF 1/5, EHL 2/5 LLE HF 3+/5 stronger than R), KE 3/5, DF 3/5, PF 3/5, EHL 2/5- can wiggle toes now on R foot  Neurological: He is alertand oriented to person, place, and time. Nocranial nerve deficit. Patient is alert . Follows commands. Oriented x3. Decreased sensation to deep pressure but has intackt LT in LEs- tested dermatomes on arms, torso and legs- otherwise intact everywhere else. Flaccid partial paralysis Psych: bright affect   Assessment/Plan: 1.  Functional deficits secondary to cauda equina syndrome  which require 3+ hours per day of interdisciplinary therapy in a comprehensive inpatient rehab setting.  Physiatrist is providing close team supervision and 24 hour management of active medical problems listed below.  Physiatrist and rehab team continue to assess barriers to discharge/monitor patient progress toward functional and medical goals  Care Tool:  Bathing    Body parts bathed by patient: Right arm, Left arm, Chest, Abdomen, Front perineal area, Right upper leg, Left upper leg, Face, Buttocks, Right lower leg, Left lower leg   Body parts bathed by helper: Buttocks     Bathing assist Assist Level: Set up assist     Upper Body Dressing/Undressing Upper body dressing   What is the patient wearing?: Pull over shirt, Orthosis Orthosis activity level: Performed by patient  Upper body assist Assist Level: Set up assist    Lower Body Dressing/Undressing Lower body dressing      What is the patient wearing?: Pants     Lower body assist Assist for lower body dressing: Set up assist     Toileting Toileting    Toileting assist Assist for toileting: Set up assist     Transfers Chair/bed transfer  Transfers assist  Chair/bed transfer activity did not occur: Safety/medical concerns  Chair/bed transfer assist level: Independent with assistive device Chair/bed transfer assistive device: Armrests   Locomotion Ambulation   Ambulation assist   Ambulation activity did not occur: Safety/medical concerns  Assist level: Dependent - Patient 0%(in LiteGait) Assistive device: Lite Gait Max distance: 185'  Walk 10 feet activity   Assist  Walk 10 feet activity did not occur: Safety/medical concerns  Assist level: Dependent - Patient 0% Assistive device: Lite Gait   Walk 50 feet activity   Assist Walk 50 feet with 2 turns activity did not occur: Safety/medical concerns  Assist level: Dependent - Patient  0% Assistive device: Lite Gait    Walk 150 feet activity   Assist Walk 150 feet activity did not occur: Safety/medical concerns  Assist level: Dependent - Patient 0% Assistive device: Lite Gait    Walk 10 feet on uneven surface  activity   Assist Walk 10 feet on uneven surfaces activity did not occur: Safety/medical concerns         Wheelchair     Assist Will patient use wheelchair at discharge?: Yes Type of Wheelchair: Manual Wheelchair activity did not occur: Safety/medical concerns  Wheelchair assist level: Independent Max wheelchair distance: 200'    Wheelchair 50 feet with 2 turns activity    Assist    Wheelchair 50 feet with 2 turns activity did not occur: Safety/medical concerns   Assist Level: Independent   Wheelchair 150 feet activity     Assist  Wheelchair 150 feet activity did not occur: Safety/medical concerns   Assist Level: Independent   Blood pressure 115/66, pulse 82, temperature 98.5 F (36.9 C), temperature source Oral, resp. rate 16, weight 94.3 kg, SpO2 98 %.  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  10/31- some improvement in RLE noted  11/7 Xray LS spine, fusion L2-3 normal post op appearance  2. Antithrombotics: -DVT/ 10/28- has R  peroneal/post tibial , Left peroneal DVT- 11/6- Changed to Xarelto- will need to be on x 3 months total -antiplatelet therapy: N/A 3. Pain Management:Hydrocodone as needed  10/28- Start Gabapentin 300 mg TID- hopefully will kick in soon, Baclofen 5 mg TID and con't Norco prn  10/29- pain better controlled- will increase nighttime Baclofen to 10 mg  11/3- will increase gabapentin to 600 mg BID and monitor for sleepiness 4. Mood:Provide emotional support -antipsychotic agents: N/A 5. Neuropsych: This  patientiscapable of making decisions on hisown behalf. 6. Skin/Wound Care:Routine skin checks 7. Fluids/Electrolytes/Nutrition:Routine in and outs with follow-up chemistries 8. Neurogenicbowel andbladder. Need to discuss removing Foley tube and begin voiding trial- however since he had >1L of volume in bladder and bladder has been stretched, Urology usually asks [place] to wait ~ 1 week of bladder rest to remove foley- will discuss starting Flomax to maximize potential of pt voiding after foley removed. - pt likely has neurogenic bowel and is during spinal shock- gut doesn't work during spinal shock and needs laxatives daily, as well as suppository nightly as part of bowel program- if pt unable to go on his own today, will put in for Suppository (enemas usually don't work in cauda equina syndrome patients) for this evening.  10/30- emptying and voiding and having BMs- continent- no caths required in 24+ hrs. Will see if can stop Flomax last week of rehab.  11/9- LBM this AM 9. Postoperative anemia. CBC from 11/2 shows  Hgb of 10.8.  11/9 Hb 11/1 10. SCI education- will educate pt on SCI issues during inpt rehab. Will likely need Rehab physician in [place], [place] and in [place] in future. 11. BP controlled. Normal 11/8  Vitals:   11/26/18 1919 11/27/18 0525  BP: 123/72 115/66  Pulse: 89 82  Resp: 16 16  Temp: 98.9 F (37.2 C) 98.5 F (36.9 C)  SpO2: 100% 98%    Dispo  11/9- d/c Thursday 11/12- going to [place] for 1-2 weeks, then going back to [place]  LOS: 14 days A FACE TO FACE EVALUATION WAS PERFORMED  Radonna Bracher 11/27/2018, 1:11 PM

## 2018-11-28 ENCOUNTER — Inpatient Hospital Stay (HOSPITAL_COMMUNITY): Payer: Self-pay | Admitting: Physical Therapy

## 2018-11-28 ENCOUNTER — Inpatient Hospital Stay (HOSPITAL_COMMUNITY): Payer: Self-pay | Admitting: Occupational Therapy

## 2018-11-28 MED ORDER — RIVAROXABAN 20 MG PO TABS: 20.0000 mg | ORAL_TABLET | Freq: Every day | ORAL | Status: DC

## 2018-11-28 NOTE — Progress Notes (Signed)
Occupational Therapy Session Note  Patient Details  Name: Nomar Broad MRN: [id number] Date of Birth: [birth year]  Today's Date: 11/28/2018 OT Individual Time: 1000-1100 OT Individual Time Calculation (min): 60 min    Short Term Goals: Week 2:  OT Short Term Goal 1 (Week 2): STG=LTG due to LOS  Skilled Therapeutic Interventions/Progress Updates:    Pt seen for OT session focusing on LE stretching/strengthening and self ROM/stretching as part of HEP. Pt desiring to work on LEs, declining IADL re-training at this time. Pt received sitting in w/c with hand off from PT. Pt agreeable to tx session and denying pain. Squat pivot transfers throughout session mod I.  Education and completion of hamstring stretch from long sitting position, supine and seated EOM with supervision/VCs for technique. Utilized leg lifter and standard bed sheet for self-stretching. Pt voices increased tightness in B LEs.  Then instructed in hip flexion and groin stretch with "butterfly" stretch in sitting on mat.  Seated EOM, completed hip AB/ADuction strengthening x10 R/L with use of level I theraband. VCs for technique. Pt returned to room at end of session, left sitting up in w/c at end of session with sister present.  Education provided throughout session regarding importance of LE stretching and functional implications, how to maintain back pre-cautions while completing stretching routine.    Therapy Documentation Precautions:  Precautions Precautions: Back, Fall Precaution Booklet Issued: No Required Braces or Orthoses: Spinal Brace Spinal Brace: Lumbar corset, Applied in sitting position Restrictions Weight Bearing Restrictions: No   Therapy/Group: Individual Therapy  Talah Cookston L 11/28/2018, 6:58 AM

## 2018-11-28 NOTE — Progress Notes (Signed)
Occupational Therapy Discharge Summary  Patient Details  Name: Joseph Reeves MRN: [id number] Date of Birth: 09/16/1965  Patient has met 7 of 7 long term goals due to improved activity tolerance, improved balance, postural control, ability to compensate for deficits and improved coordination.  Patient to discharge at overall Modified Independent level.  Patient to initially d/c to friend's home before transitioning back to [place].  He is mod I from w/c level, squat pivot for transfers.  He bathes from seated position on TTB or on EOB. Dresses mod I seated EOB with lateral leans for clothing management.  Recommend w/c level only at d/c. No formal family education completed. Family present intermittently throughout rehab admission, however, did not take part in tx sessions. Pt able to direct care as needed.   Recommendation:  Patient will benefit from ongoing skilled OT services in home health setting to continue to advance functional skills in the area of BADL, iADL and Reduce care partner burden.  Equipment: Drop arm BSC and TTB  Reasons for discharge: treatment goals met and discharge from hospital  Patient/family agrees with progress made and goals achieved: Yes  OT Discharge Precautions/Restrictions  Precautions Precautions: Back;Fall Required Braces or Orthoses: Spinal Brace Spinal Brace: Lumbar corset;Applied in sitting position Restrictions Weight Bearing Restrictions: No Vision Baseline Vision/History: Wears glasses Patient Visual Report: No change from baseline Vision Assessment?: No apparent visual deficits Perception  Perception: Within Functional Limits Praxis Praxis: Intact Cognition Overall Cognitive Status: Within Functional Limits for tasks assessed Arousal/Alertness: Awake/alert Orientation Level: Oriented X4 Focused Attention: Appears intact Memory: Appears intact Awareness: Appears intact Problem Solving: Appears intact Safety/Judgment: Appears  intact Sensation Sensation Light Touch: Impaired Detail Light Touch Impaired Details: Impaired RLE;Impaired LLE Proprioception: Impaired Detail Proprioception Impaired Details: Impaired RLE;Impaired LLE Coordination Gross Motor Movements are Fluid and Coordinated: No Fine Motor Movements are Fluid and Coordinated: Yes Coordination and Movement Description: impaired 2/2 cauda equina/incomplete paraplegia R>L Motor  Motor Motor: Paraplegia;Abnormal tone;Abnormal postural alignment and control Motor - Discharge Observations: Incomplete paraplegia R>L Trunk/Postural Assessment  Cervical Assessment Cervical Assessment: Within Functional Limits Thoracic Assessment Thoracic Assessment: Exceptions to WFL(Back pre-cautions) Lumbar Assessment Lumbar Assessment: Exceptions to WFL(Back pre-cautions; lumbar corsett) Postural Control Postural Control: Within Functional Limits  Balance Balance Balance Assessed: Yes Static Sitting Balance Static Sitting - Balance Support: Feet supported;No upper extremity supported Static Sitting - Level of Assistance: 7: Independent Static Sitting - Comment/# of Minutes: Sitting EOB Dynamic Sitting Balance Dynamic Sitting - Balance Support: No upper extremity supported;During functional activity;Feet supported Dynamic Sitting - Level of Assistance: 6: Modified independent (Device/Increase time) Sitting balance - Comments: Sitting on tub bench to complete bathing at shower level Static Standing Balance Static Standing - Balance Support: During functional activity;Right upper extremity supported;Left upper extremity supported Static Standing - Level of Assistance: 4: Min assist Static Standing - Comment/# of Minutes: Standing at sink; heavy UE reliance and blocking of R knee Extremity/Trunk Assessment RUE Assessment RUE Assessment: Within Functional Limits LUE Assessment LUE Assessment: Within Functional Limits   Rounds, Amy L 11/28/2018, 3:24 PM

## 2018-11-28 NOTE — Discharge Summary (Signed)
Physician Discharge Summary  Patient ID: Joseph Reeves MRN: [id number] DOB/AGE: 1965-06-04 53 y.o.  Admit date: 11/13/2018 Discharge date: 11/30/2018  Discharge Diagnoses:  Principal Problem:   Cauda equina syndrome (HCC) Active Problems:   Leg weakness   Cauda equina syndrome with neurogenic bladder (HCC)   Neurogenic bladder, flaccid   Neurogenic bowel Left peroneal DVT/right peroneal posterior tibial DVT Pain management Postoperative anemia Tobacco abuse  Discharged Condition: Stable  Significant Diagnostic Studies: Dg Chest 2 View  Result Date: 11/06/2018 CLINICAL DATA:  Leg weakness, light smoker EXAM: CHEST - 2 VIEW COMPARISON:  None FINDINGS: Normal heart size, mediastinal contours, and pulmonary vascularity. Lungs clear. No pleural effusion or pneumothorax. Bones unremarkable. IMPRESSION: Normal exam.  A Electronically Signed   By: Ulyses Southward M.D.   On: 11/06/2018 18:18   Dg Lumbar Spine 2-3 Views  Result Date: 11/25/2018 CLINICAL DATA:  10/22 lumbar fusion. Pt with severe weakness, unable to bear weight. Standing films. Motion due to 2 techs having to hold patient, very unsteady. Did not repeat due to safety concerns. EXAM: LUMBAR SPINE - 2-3 VIEW COMPARISON:  Lumbar spine radiographs 11/09/2018 FINDINGS: Posterior fixation with pedicle screws at L2-3 with interbody spacer. Hardware appears intact. Stable alignment. Vertebral body heights and intervertebral disc spaces are maintained. SI joints are open. No acute finding in the visualized pelvis. Nonobstructive bowel gas pattern. IMPRESSION: Stable postoperative appearance with posterior fusion at L2-3. No evidence of hardware complication or other acute finding. Electronically Signed   By: Emmaline Kluver M.D.   On: 11/25/2018 11:10   Dg Lumbar Spine 2-3 Views  Result Date: 11/09/2018 CLINICAL DATA:  L2-3 PLIF EXAM: DG C-ARM 1-60 MIN; LUMBAR SPINE - 2-3 VIEW COMPARISON:  Intraoperative films from earlier in the  same day. FLUOROSCOPY TIME:  Fluoroscopy Time:  48 seconds Radiation Exposure Index (if provided by the fluoroscopic device): Not available Number of Acquired Spot Images: 2 FINDINGS: Interbody fusion at L2-3 is noted with pedicle screws and posterior fixation. IMPRESSION: L2-3 fusion Electronically Signed   By: Alcide Clever M.D.   On: 11/09/2018 15:43   Dg Lumbar Spine 2-3 Views  Result Date: 11/09/2018 CLINICAL DATA:  Intraoperative localization EXAM: LUMBAR SPINE - 2-3 VIEW COMPARISON:  11/07/2018 FINDINGS: Lateral views of the lumbar spine were obtained. Initial image demonstrates needles within the posterior soft tissues at the L1 and L3 levels. Subsequent lateral few shows surgical retractors and instruments at the L2-3 level. IMPRESSION: Intraoperative localization at L2-3. Electronically Signed   By: Alcide Clever M.D.   On: 11/09/2018 12:56   Ct Head Wo Contrast  Result Date: 11/06/2018 CLINICAL DATA:  Lower extremity weakness EXAM: CT HEAD WITHOUT CONTRAST TECHNIQUE: Contiguous axial images were obtained from the base of the skull through the vertex without intravenous contrast. COMPARISON:  MRI 11/04/2018 FINDINGS: Brain: No acute territorial infarction, hemorrhage or intracranial mass. The ventricles are nonenlarged. Vascular: No hyperdense vessel or unexpected calcification. Skull: Normal. Negative for fracture or focal lesion. Sinuses/Orbits: No acute finding. Other: None IMPRESSION: Negative non contrasted CT appearance of the brain. Electronically Signed   By: Jasmine Pang M.D.   On: 11/06/2018 17:53   Mr Laqueta Jean WU Contrast  Result Date: 11/07/2018 CLINICAL DATA:  Bilateral lower extremity weakness. EXAM: MRI HEAD WITHOUT AND WITH CONTRAST TECHNIQUE: Multiplanar, multiecho pulse sequences of the brain and surrounding structures were obtained without and with intravenous contrast. CONTRAST:  10mL GADAVIST GADOBUTROL 1 MMOL/ML IV SOLN COMPARISON:  Head CT yesterday FINDINGS: Brain: The  brain has a normal appearance without evidence of malformation, atrophy, old or acute small or large vessel infarction, mass lesion, hemorrhage, hydrocephalus or extra-axial collection. Vascular: Major vessels at the base of the brain show flow. Venous sinuses appear patent. Skull and upper cervical spine: Normal. Sinuses/Orbits: Mucosal thickening of the right maxillary sinus. Other sinuses clear. Other: Fluid in the posterior nasopharynx, reason not certain. IMPRESSION: Normal brain MRI. No abnormality seen to explain the clinical presentation. Electronically Signed   By: Paulina Fusi M.D.   On: 11/07/2018 17:40   Mr Lumbar Spine Wo Contrast  Result Date: 11/04/2018 CLINICAL DATA:  Severe low back pain with bilateral lower extremity numbness EXAM: MRI LUMBAR SPINE WITHOUT CONTRAST TECHNIQUE: Multiplanar, multisequence MR imaging of the lumbar spine was performed. No intravenous contrast was administered. COMPARISON:  None. FINDINGS: Segmentation:  Standard. Alignment: Straightening of the lumbar lordosis without static listhesis. Vertebrae: No fracture, evidence of discitis, or bone lesion. Mild diffuse intrinsic canal narrowing on the basis of congenitally short pedicles. Multilevel degenerative endplate spurring. Conus medullaris and cauda equina: Conus extends to the L1 level. Conus and cauda equina appear normal. Paraspinal and other soft tissues: Partially visualized large T2 hyperintense structure within the pelvis, likely a distended urinary bladder. Disc levels: T12-L1: Mild bilateral facet arthrosis. No disc protrusion. No foraminal or canal stenosis. L1-L2: Diffuse disc bulge with endplate spurring and mild bilateral facet arthrosis results in mild canal stenosis and minimal bilateral foraminal narrowing without evidence of neural impingement. L2-L3: Central disc extrusion with 6 mm caudal extension. Degenerative endplate spurring and mild bilateral facet arthrosis contribute to severe canal  stenosis and moderate right foraminal stenosis. L3-L4: Diffuse disc bulge with endplate spurring and mild bilateral facet arthrosis resulting in severe right and moderate left foraminal stenosis. Mild canal stenosis. L4-L5: Diffuse disc bulge and degenerative endplate spurring with mild bilateral facet arthrosis result in severe bilateral foraminal stenosis with mild canal stenosis. L5-S1: Small central disc protrusion and mild bilateral facet arthrosis no significant foraminal or canal stenosis. IMPRESSION: 1. 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 2. Multilevel foraminal stenosis, severe on the right at L3-4 and bilaterally at L4-5. 3. Partially visualized large T2 hyperintense structure within the pelvis, likely a distended urinary bladder. Electronically Signed   By: Duanne Guess M.D.   On: 11/04/2018 16:03   Mr Thoracic Spine W Wo Contrast  Result Date: 11/07/2018 CLINICAL DATA:  Numbness, tingling and paresthesia of the extremities. EXAM: MRI THORACIC WITHOUT AND WITH CONTRAST TECHNIQUE: Multiplanar and multiecho pulse sequences of the thoracic spine were obtained without and with intravenous contrast. CONTRAST:  10mL GADAVIST GADOBUTROL 1 MMOL/ML IV SOLN COMPARISON:  None. FINDINGS: MRI THORACIC SPINE FINDINGS Alignment:  Normal Vertebrae: Normal Cord:  No primary cord lesion.  See below regarding stenosis. Paraspinal and other soft tissues: Negative Disc levels: No abnormality from T1-2 through T4-5. T5-6: Minor left-sided disc bulge.  No compressive stenosis. T6-7: Normal. T7-8: Central to slightly right-sided disc herniation, narrowing the ventral subarachnoid space but not compressing the cord. No compressive foraminal narrowing. T8-9: Normal interspace. T9-10: Minor left-sided disc bulge.  No compressive stenosis. T10-11: Right paracentral disc bulge.  No compressive stenosis.  T11-12 and T12-L1: Normal. IMPRESSION: Degenerative changes as outlined above. The only potentially symptomatic finding is at T7-8 where there is a central to right-sided disc herniation. This narrows the ventral subarachnoid space and contacts the ventral cord,  but ample subarachnoid spaces present dorsal to the cord in there does not appear to be any cord compression or abnormal cord signal. None the less, this finding could be symptomatic. Electronically Signed   By: Paulina Fusi M.D.   On: 11/07/2018 17:45   Mr Lumbar Spine W Wo Contrast  Result Date: 11/07/2018 CLINICAL DATA:  Lower extremity numbness. EXAM: MRI LUMBAR SPINE WITHOUT AND WITH CONTRAST TECHNIQUE: Multiplanar and multiecho pulse sequences of the lumbar spine were obtained without and with intravenous contrast. CONTRAST:  10mL GADAVIST GADOBUTROL 1 MMOL/ML IV SOLN COMPARISON:  11/04/2018 FINDINGS: Segmentation:  5 lumbar type vertebral bodies. Alignment:  Straightening of the normal lumbar lordosis. Vertebrae:  No fracture or primary bone lesion. Conus medullaris and cauda equina: Conus extends to the T12-L1 level. Conus and cauda equina appear normal. Paraspinal and other soft tissues: Distended bladder Disc levels: L1-2: Circumferential bulging of the disc. Mild stenosis without visible neural compression. L2-3: Broad-based disc herniation with caudal migration behind upper L3. Severe spinal stenosis with effacement of the subarachnoid space and likely nerve compression. L3-4: Bulging of the disc. Mild stenosis of both lateral recesses and foramina without definite neural compression. L4-5: Bulging of the disc more towards the left. Mild stenosis of both lateral recesses. Left foraminal narrowing with some potential for neural compression. L5-S1: Disc bulge. Mild facet hypertrophy. No compressive stenosis. IMPRESSION: L2-3: Large broad-based disc herniation with a large caudally migrated fragment behind L3 compressing the thecal sac and  likely to cause neural compression. This is quite likely the significant lesion in this case. Disc bulges at L1-2, L3-4 and L4-5 as noted above with lateral recess and foraminal stenoses but no definite neural compression. Electronically Signed   By: Paulina Fusi M.D.   On: 11/07/2018 17:51   Dg Chest Port 1 View  Result Date: 11/11/2018 CLINICAL DATA:  Fever EXAM: PORTABLE CHEST 1 VIEW COMPARISON:  11/06/2018 FINDINGS: The heart size and mediastinal contours are within normal limits. Both lungs are clear. The visualized skeletal structures are unremarkable. IMPRESSION: No acute abnormality of the lungs in AP portable projection. Electronically Signed   By: Lauralyn Primes M.D.   On: 11/11/2018 13:08   Dg C-arm 1-60 Min  Result Date: 11/09/2018 CLINICAL DATA:  L2-3 PLIF EXAM: DG C-ARM 1-60 MIN; LUMBAR SPINE - 2-3 VIEW COMPARISON:  Intraoperative films from earlier in the same day. FLUOROSCOPY TIME:  Fluoroscopy Time:  48 seconds Radiation Exposure Index (if provided by the fluoroscopic device): Not available Number of Acquired Spot Images: 2 FINDINGS: Interbody fusion at L2-3 is noted with pedicle screws and posterior fixation. IMPRESSION: L2-3 fusion Electronically Signed   By: Alcide Clever M.D.   On: 11/09/2018 15:43   Vas [place] Lower Extremity Venous (dvt)  Result Date: 11/23/2018  Lower Venous Study Indications: Follow up.  Comparison Study: 11/14/18 previous Performing Technologist: Blanch Media RVS  Examination Guidelines: A complete evaluation includes B-mode imaging, spectral Doppler, color Doppler, and power Doppler as needed of all accessible portions of each vessel. Bilateral testing is considered an integral part of a complete examination. Limited examinations for reoccurring indications may be performed as noted.  +---------+---------------+---------+-----------+----------+--------------+ RIGHT    CompressibilityPhasicitySpontaneityPropertiesThrombus Aging  +---------+---------------+---------+-----------+----------+--------------+ CFV      Full           Yes      Yes                                 +---------+---------------+---------+-----------+----------+--------------+  SFJ      Full                                                        +---------+---------------+---------+-----------+----------+--------------+ FV Prox  Full                                                        +---------+---------------+---------+-----------+----------+--------------+ FV Mid   Full                                                        +---------+---------------+---------+-----------+----------+--------------+ FV DistalFull                                                        +---------+---------------+---------+-----------+----------+--------------+ PFV      Full                                                        +---------+---------------+---------+-----------+----------+--------------+ POP      Full           Yes      Yes                  sluggish flow  +---------+---------------+---------+-----------+----------+--------------+ PTV      None                                         Acute          +---------+---------------+---------+-----------+----------+--------------+ PERO     None                                         Acute          +---------+---------------+---------+-----------+----------+--------------+   +---------+---------------+---------+-----------+----------+--------------+ LEFT     CompressibilityPhasicitySpontaneityPropertiesThrombus Aging +---------+---------------+---------+-----------+----------+--------------+ CFV      Full           Yes      Yes                                 +---------+---------------+---------+-----------+----------+--------------+ SFJ      Full                                                         +---------+---------------+---------+-----------+----------+--------------+ FV Prox  Full                                                        +---------+---------------+---------+-----------+----------+--------------+ FV Mid   Full                                                        +---------+---------------+---------+-----------+----------+--------------+ FV DistalFull                                                        +---------+---------------+---------+-----------+----------+--------------+ PFV      Full                                                        +---------+---------------+---------+-----------+----------+--------------+ POP      Full           Yes      Yes                  sluggish flow  +---------+---------------+---------+-----------+----------+--------------+ PTV      Full                                                        +---------+---------------+---------+-----------+----------+--------------+ PERO     Partial                                      Acute          +---------+---------------+---------+-----------+----------+--------------+     Summary: Right: Findings consistent with acute deep vein thrombosis involving the right posterior tibial veins, and right peroneal veins. No cystic structure found in the popliteal fossa. Left: Findings consistent with acute deep vein thrombosis involving the left peroneal veins. No cystic structure found in the popliteal fossa.  *See table(s) above for measurements and observations. Electronically signed by Sherald Hess MD on 11/23/2018 at 3:37:52 PM.    Final    Vas [place] Lower Extremity Venous (dvt)  Result Date: 11/15/2018  Lower Venous Study Risk Factors: Status post L2-3 lumbar decompression including L2 and L3 laminectomy, L2-3 discectomy, 2-3 foraminotomy and decompression of central canal 11/09/2018. Comparison Study: No prior study on file for comparison Performing Technologist:  Sherren Kerns RVS  Examination Guidelines: A complete evaluation includes B-mode imaging, spectral Doppler, color Doppler, and power Doppler as needed of all accessible portions of each vessel. Bilateral testing is considered an integral part of a complete examination. Limited examinations for reoccurring indications may be performed as noted.  +---------+---------------+---------+-----------+----------+--------------+ RIGHT    CompressibilityPhasicitySpontaneityPropertiesThrombus Aging +---------+---------------+---------+-----------+----------+--------------+ CFV      Full  Yes      Yes                                 +---------+---------------+---------+-----------+----------+--------------+ SFJ      Full                                                        +---------+---------------+---------+-----------+----------+--------------+ FV Prox  Full                                                        +---------+---------------+---------+-----------+----------+--------------+ FV Mid   Full                                                        +---------+---------------+---------+-----------+----------+--------------+ FV DistalFull                                                        +---------+---------------+---------+-----------+----------+--------------+ PFV      Full                                                        +---------+---------------+---------+-----------+----------+--------------+ POP      Full                                         sluggish flow  +---------+---------------+---------+-----------+----------+--------------+ PTV      None                                         Acute          +---------+---------------+---------+-----------+----------+--------------+ PERO     None                                         Acute          +---------+---------------+---------+-----------+----------+--------------+    +---------+---------------+---------+-----------+----------+--------------+ LEFT     CompressibilityPhasicitySpontaneityPropertiesThrombus Aging +---------+---------------+---------+-----------+----------+--------------+ CFV      Full           Yes      Yes                                 +---------+---------------+---------+-----------+----------+--------------+ SFJ      Full                                                        +---------+---------------+---------+-----------+----------+--------------+  FV Prox  Full                                                        +---------+---------------+---------+-----------+----------+--------------+ FV Mid   Full                                                        +---------+---------------+---------+-----------+----------+--------------+ FV DistalFull                                                        +---------+---------------+---------+-----------+----------+--------------+ PFV      Full                                                        +---------+---------------+---------+-----------+----------+--------------+ POP      Full                                         sluggish flow  +---------+---------------+---------+-----------+----------+--------------+ PTV      None                                         Acute          +---------+---------------+---------+-----------+----------+--------------+ PERO     None                                         Acute          +---------+---------------+---------+-----------+----------+--------------+     Summary: Right: Findings consistent with acute deep vein thrombosis involving the right posterior tibial veins, and right peroneal veins. Left: Findings consistent with acute deep vein thrombosis involving the left posterior tibial veins, and left peroneal veins.  *See table(s) above for measurements and observations. Electronically signed by  Waverly Ferrari MD on 11/15/2018 at 6:23:47 AM.    Final     Labs:  Basic Metabolic Panel: Recent Labs  Lab 11/27/18 0630  NA 139  K 4.1  CL 103  CO2 28  GLUCOSE 119*  BUN 17  CREATININE 1.10  CALCIUM 9.2    CBC: Recent Labs  Lab 11/27/18 0630  WBC 4.3  NEUTROABS 2.4  HGB 11.1*  HCT 34.8*  MCV 97.8  PLT 429*    CBG: No results for input(s): GLUCAP in the last 168 hours.  . Father with hypertension and heart failure. Denies any hyperlipidemia colon cancer or diabetes mellitus  Brief HPI:   Joseph Reeves is a 53 y.o. right-handed male with history of tobacco abuse as well as sciatica status post ESI in the remote past maintained  on Valium, Mobic as well as oxycodone as needed. Per chart review lives with significant other. 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 He has a sister in [place] as well as another sister in [place] with good support. Presented 11/07/2018 with numbness in his right leg and foot greater than the left leg. He also describes weakness in left lower extremity right greater than left. Patient also noticed some difficulty with urination. CT and MRI of the brain unremarkable. Sedimentation rate within normal limits, Covid negative, urine drug screen positive benzos. LP completed CSF inconclusive due to bloody tap. 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 Underwent L2-3 lumbar decompression including L2 and L3 laminectomy, discectomy and decompression of central canal 2018-11-09 per Dr. Newell Coral. Hospital course pain management. Acute blood loss anemia 10.7. Low-grade fever 101 urine study negative chest x-ray showed no active disease blood cultures no growth to date. Bouts of urinary retention residuals greater than 900 a Foley catheter  tube was inserted. Patient was admitted for a comprehensive rehab program   Hospital Course: Ayomide Purdy was admitted to rehab 11/13/2018 for inpatient therapies to consist of PT, ST and OT at least three hours five days a week. Past admission physiatrist, therapy team and rehab RN have worked together to provide customized collaborative inpatient rehab. Pertaining to patient cauda equina syndrome L1 [place] C incomplete paraplegia status post L2-3 decompression including laminectomy with discectomy per Dr. Newell Coral 11/09/2018. Follow-up x-rays stable. Patient was to be discharged out of the area to stay with his sister recommendations were made for follow-up neurosurgery and medical management. Hospital course findings of right perineal posterior tibial left peroneal DVT initially on Lovenox changed to Xarelto x3 months. Pain managed with use of Neurontin 300 mg 3 times daily baclofen 5 mg 3 times daily and hydrocodone as needed. His baclofen was increased to 10 mg 2018-11-16 and Neurontin titrated to 600 mg twice daily. Neurogenic bowel and bladder . Patient was placed on Flomax initially needing in and out catheterization later voiding on his own.  His bowel program was adjusted accordingly with full teaching completed. Postoperative anemia 10.8 no bleeding episodes.   Blood pressures were monitored on TID basis and stable   He/ has made gains during rehab stay and is attending therapies  He/ will continue to receive follow up therapies   after discharge  Rehab course: During patient's stay in rehab weekly team conferences were held to monitor patient's progress, set goals and discuss barriers to discharge. At admission, patient required minimal guard supine to sit, moderate assist side-lying to sitting. Minimum to moderate assist sit to stand. Moderate assist lower body bathing set up upper body bathing minimal assist upper body dressing max assist lower body dressing  Physical exam. Blood  pressure 121/69 pulse 79 temperature 99 respirations 18 oxygen saturation 99% room air Constitutional alert and oriented HEENT Head. Normocephalic and atraumatic Mouth and throat. No oropharyngeal exudate Eyes pupils round and reactive to light no discharge no nystagmus Neck. Supple nontender no tracheal deviation no thyromegaly Cardiovascular exam reveals no gallop or friction rub no murmur heard Respiratory effort normal breath sounds normal clear to auscultation GI. Hyperactive bowel sounds nontender without rebound Genitourinary Foley tube in place Musculoskeletal. General. No tenderness or edema. Upper extremities 5 out of 5 deltoids biceps triceps and  grip. Lower extremities right lower extremity hip flexors 2 out of 5 knee extension 2 out of 5 dorsi flexion 2 out of 5 plantarflexion 1 out of 5. Left lower extremity hip flexors 2 out of 5 slightly stronger than the right knee extension 3 out of 5, dorsiflexion 3 out of 5 plantar flexion 2 out of 5 Skin. Back incision dressed  He/  has had improvement in activity tolerance, balance, postural control as well as ability to compensate for deficits. He/ has had improvement in functional use RUE/LUE  and RLE/LLE as well as improvement in awareness. Working with energy conservation techniques. Sit to stand with supervision to stedy. Stedy transfer to wheelchair. Squat pivot transfers to wheelchair mat table modified independent. Patient demonstrates good understanding of full education and good safety awareness. Propels wheelchair modified independent. Demonstration of how to perform bridging supervision. Supine to side-lying modified independent. Squat pivot transfer back to recliner modified independent. Car transfer via squat pivot to a 27 inch seat height with close supervision. ADL squat pivot transfers throughout sessions modified independent. Supine and seated edge of mat with supervision. Full teaching completed plan discharge to home        Disposition: Discharge disposition: 01-Home or Self Care     Discharge to home   Diet: Regular  Special Instructions: No smoking driving or alcohol  Patient instructed to obtain neurosurgeon for follow-up once patient established out-of-state with family  He should remain on Xarelto for 3 months and follow-up Doppler studies. For DVT    Medications at discharge. 1. Tylenol as needed 2. Baclofen 5 mg 3 times daily and 10 mg nightly 3. Neurontin 600 mg p.o. twice daily 4. Glycerin suppository rectally daily as needed 5. Hydrocodone 1 to 2 tablets every 4 hours as needed pain 6. MiraLAX daily 7. Xarelto 15 mg twice daily until 12/13/2018 then begin 20 mg daily 8. Senokot tablet 1 tablet p.o. twice daily 9. Flomax 0.4 mg daily  Discharge Instructions    Ambulatory referral to Physical Medicine Rehab   Complete by: As directed    As needed as patient is being discharged out of state.      Follow-up Information    Lovorn, Jinny Blossom, MD Follow up.   Specialty: Physical Medicine and Rehabilitation Why: At patient request as needed as patient is being discharged out of state Contact information: [address] [place] [address] [phone]        Jovita Gamma, MD Follow up.   Specialty: Neurosurgery Why: As needed as patient will be discharged to [place] information: [address] [place] [address] [phone]           Signed: Lavon Paganini Ava 11/30/2018, 5:21 AM

## 2018-11-28 NOTE — Patient Care Conference (Signed)
Inpatient RehabilitationTeam Conference and Plan of Care Update Date: 11/28/2018   Time: 11:10 AM   Patient Name: Joseph Reeves      Medical Record Number: [id number]  Date of Birth: 12-23-1965 Sex: Male         Room/Bed: 4M08C/4M08C-01 Payor Info: Payor: MEDICAID POTENTIAL / Plan: MEDICAID POTENTIAL / Product Type: *No Product type* /    Admit Date/Time:  11/13/2018  7:26 PM  Primary Diagnosis:  Cauda equina syndrome [facility])  Patient Active Problem List   Diagnosis Date Noted  . Cauda equina syndrome (HCC) 11/13/2018  . Cauda equina syndrome with neurogenic bladder (HCC)   . Neurogenic bladder, flaccid   . Neurogenic bowel   . Leg weakness 11/07/2018  . Numbness and tingling of both legs 11/07/2018    Expected Discharge Date: Expected Discharge Date: 11/30/18  Team Members Present: Physician leading conference: Dr. Genice Rouge Social Worker Present: Amada Jupiter, LCSW Nurse Present: Other (comment)(Tina Laural Benes, RN) Case Manager: Roderic Palau, RN PT Present: Peter Congo, PT OT Present: Amy Rounds, OT SLP Present: Suzzette Righter, CF-SLP PPS Coordinator present : Fae Pippin, SLP     Current Status/Progress Goal Weekly Team Focus  Bowel/Bladder   Patient is continent of bladder/bowel, voiding without difficuly continue Flomax daily and daily senna and Myralax . LBM 11/27/18  Remain continent  Assess toileting needs and address patient concerns, continue daily meds,monitoring of diet and prn medications   Swallow/Nutrition/ Hydration             ADL's   Supervision-mod I squat pivot transfers, set-up bathing/dressing from seated level, supervision toileting  Mod I overall  ADL/IADL re-training, SCI education, family education and d/c planning   Mobility   Supervision to mod I overall for all mobility at w/c level, dependent gait in LiteGait  mod I at w/c level  d/c planning, SCI education   Communication             Safety/Cognition/ Behavioral Observations             Pain   Patient has not received prn pain meds x 72 hours ( Norco ) states pain to back is at a tolerate range but discomfort to right foot/ankle remains a great concer secondary to decrease movement,sensatio of moving toes and constant tingling/  pain < = 3  QS pain assess/prn adminster medications as ordered, evaluate effectiveness of meds and educate   Skin   s/p Lamkinectomy, post skin glue to incisional site, no dressing or drainage,healing well, Back brace while up OOB  Remain free of infection and skin breakage  Assess surgical area( incision site) QS/PRN address any concerns or signs of infection, irritation and education on incisional care    Rehab Goals Patient on target to meet rehab goals: Yes *See Care Plan and progress notes for long and short-term goals.     Barriers to Discharge  Current Status/Progress Possible Resolutions Date Resolved   Nursing                  PT                    OT                  SLP                SW                Discharge Planning/Teaching Needs:  Pt has not changed  plan to d/c initially to a friend's home in [place]  then return to his home in [place].  Teaching needs TBD and planned ed closer to d/c.   Team Discussion: Nerve pain R foot, meds adjusted, trace R LE edema, distal DVT, on Xarelto.  RN - cont B/B, using lumbar brace.  OT mod I w/c level, add sit to stand some.  PT mod I transfers w/c, estim education done.   Revisions to Treatment Plan: N/A     Medical Summary Current Status: Pt's nerve pain better controlled on gabapentin; walked max 300 ft in lite gait with 3-4 rest stops min-mod assist Weekly Focus/Goal: family training/d/c this week  Barriers to Discharge: Decreased family/caregiver support;Home enviroment access/layout;Neurogenic Bowel & Bladder  Barriers to Discharge Comments: peroneal and post tibial DVTs- on Xarelto Possible Resolutions to Barriers: estim/strengthening for hopeful long term gait  goals   Continued Need for Acute Rehabilitation Level of Care: The patient requires daily medical management by a physician with specialized training in physical medicine and rehabilitation for the following reasons: Direction of a multidisciplinary physical rehabilitation program to maximize functional independence : Yes Medical management of patient stability for increased activity during participation in an intensive rehabilitation regime.: Yes Analysis of laboratory values and/or radiology reports with any subsequent need for medication adjustment and/or medical intervention. : Yes   I attest that I was present, lead the team conference, and concur with the assessment and plan of the team.   Retta Diones 11/29/2018, 3:00 PM  Team conference was held via web/ teleconference due to [place] - 19

## 2018-11-28 NOTE — Progress Notes (Signed)
[facility] PHYSICAL MEDICINE & REHABILITATION PROGRESS NOTE   Subjective/Complaints:  Pt reports feel great; lite gait was great but very painful for groin due to harness- walked 90 ft then 15180ft- total >31800ft- but when got tired, was awful, due to groin pain.  ROS: denies CP, SOB, N/V/D Objective:   No results found. Recent Labs    11/27/18 0630  WBC 4.3  HGB 11.1*  HCT 34.8*  PLT 429*   Recent Labs    11/27/18 0630  NA 139  K 4.1  CL 103  CO2 28  GLUCOSE 119*  BUN 17  CREATININE 1.10  CALCIUM 9.2    Intake/Output Summary (Last 24 hours) at 11/28/2018 0853 Last data filed at 11/28/2018 0535 Gross per 24 hour  Intake 360 ml  Output 1450 ml  Net -1090 ml     Physical Exam: Vital Signs Blood pressure 110/72, pulse 78, temperature 98.3 F (36.8 C), resp. rate 18, weight 94.3 kg, SpO2 99 %.  Physical Exam Nursing noteand vitalsreviewed. Constitutional:sitting up in bedside chair again,, bright cheery affect, wiggling toes on both feet, better on L still- does it all the time; sister in room, NAD HENT:  Head:Normocephalicand atraumatic.  Nose:Nose normal.  Mouth/Throat: Nooropharyngeal exudate.  Eyes:.conjugate gaze Neck:Normal range of motion.Neck supple. Cardiovascular:  RRR  Respiratory: CTA B/L with no wheezes, rales or rhonchi heard ZO:XWRUGI:soft, NT, ND, (+)BS Musculoskeletal:  General: No tenderness; mild/trace edema of R foot/ankle, esp lateral ankle.  Comments: UEs 5/5 in deltoids, biceps, WE, triceps, grip and finger abd B/L LEs- RLE HF 2-5, KE 4/5, DF 3/5, PF 1/5, EHL 2/5 LLE HF 3+/5 stronger than R), KE 3/5, DF 3/5, PF 3/5, EHL 2/5- can wiggle toes now on R foot  Neurological: He is alertand oriented to person, place, and time. Nocranial nerve deficit. Patient is alert . Follows commands. Oriented x3. Decreased sensation to deep pressure but has intackt LT in LEs- tested dermatomes on arms, torso and legs- otherwise  intact everywhere else. Flaccid partial paralysis Psych: bright affect   Assessment/Plan: 1. Functional deficits secondary to cauda equina syndrome  which require 3+ hours per day of interdisciplinary therapy in a comprehensive inpatient rehab setting.  Physiatrist is providing close team supervision and 24 hour management of active medical problems listed below.  Physiatrist and rehab team continue to assess barriers to discharge/monitor patient progress toward functional and medical goals  Care Tool:  Bathing    Body parts bathed by patient: Right arm, Left arm, Chest, Abdomen, Front perineal area, Right upper leg, Left upper leg, Face, Buttocks, Right lower leg, Left lower leg   Body parts bathed by helper: Buttocks     Bathing assist Assist Level: Set up assist     Upper Body Dressing/Undressing Upper body dressing   What is the patient wearing?: Pull over shirt, Orthosis Orthosis activity level: Performed by patient  Upper body assist Assist Level: Independent    Lower Body Dressing/Undressing Lower body dressing      What is the patient wearing?: Pants     Lower body assist Assist for lower body dressing: Independent with assitive device     Toileting Toileting    Toileting assist Assist for toileting: Set up assist     Transfers Chair/bed transfer  Transfers assist  Chair/bed transfer activity did not occur: Safety/medical concerns  Chair/bed transfer assist level: Independent with assistive device Chair/bed transfer assistive device: Armrests   Locomotion Ambulation   Ambulation assist   Ambulation activity did  not occur: Safety/medical concerns  Assist level: Dependent - Patient 0%(in LiteGait) Assistive device: Lite Gait Max distance: 185'   Walk 10 feet activity   Assist  Walk 10 feet activity did not occur: Safety/medical concerns  Assist level: Dependent - Patient 0% Assistive device: Lite Gait   Walk 50 feet  activity   Assist Walk 50 feet with 2 turns activity did not occur: Safety/medical concerns  Assist level: Dependent - Patient 0% Assistive device: Lite Gait    Walk 150 feet activity   Assist Walk 150 feet activity did not occur: Safety/medical concerns  Assist level: Dependent - Patient 0% Assistive device: Lite Gait    Walk 10 feet on uneven surface  activity   Assist Walk 10 feet on uneven surfaces activity did not occur: Safety/medical concerns         Wheelchair     Assist Will patient use wheelchair at discharge?: Yes Type of Wheelchair: Manual Wheelchair activity did not occur: Safety/medical concerns  Wheelchair assist level: Independent Max wheelchair distance: 200'    Wheelchair 50 feet with 2 turns activity    Assist    Wheelchair 50 feet with 2 turns activity did not occur: Safety/medical concerns   Assist Level: Independent   Wheelchair 150 feet activity     Assist  Wheelchair 150 feet activity did not occur: Safety/medical concerns   Assist Level: Independent   Blood pressure 110/72, pulse 78, temperature 98.3 F (36.8 C), resp. rate 18, weight 94.3 kg, SpO2 99 %.  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  10/31- some improvement in RLE noted  11/7 Xray LS spine, fusion L2-3 normal post op appearance  2. Antithrombotics: -DVT/ 10/28- has R  peroneal/post tibial , Left peroneal DVT- 11/6- Changed to Xarelto- will need to be on x 3 months total -antiplatelet therapy: N/A 3. Pain Management:Hydrocodone as needed  10/28- Start Gabapentin 300 mg TID- hopefully will kick in soon, Baclofen 5 mg TID and con't Norco prn  10/29- pain better controlled- will increase nighttime Baclofen to 10 mg  11/3- will increase gabapentin to 600 mg BID and monitor for sleepiness 4.  Mood:Provide emotional support -antipsychotic agents: N/A 5. Neuropsych: This patientiscapable of making decisions on hisown behalf. 6. Skin/Wound Care:Routine skin checks 7. Fluids/Electrolytes/Nutrition:Routine in and outs with follow-up chemistries 8. Neurogenicbowel andbladder. Need to discuss removing Foley tube and begin voiding trial- however since he had >1L of volume in bladder and bladder has been stretched, Urology usually asks [place] to wait ~ 1 week of bladder rest to remove foley- will discuss starting Flomax to maximize potential of pt voiding after foley removed. - pt likely has neurogenic bowel and is during spinal shock- gut doesn't work during spinal shock and needs laxatives daily, as well as suppository nightly as part of bowel program- if pt unable to go on his own today, will put in for Suppository (enemas usually don't work in cauda equina syndrome patients) for this evening.  10/30- emptying and voiding and having BMs- continent- no caths required in 24+ hrs. Will see if can stop Flomax last week of rehab.  11/9- LBM this AM 9. Postoperative anemia. CBC from 11/2 shows  Hgb of 10.8.  11/9 Hb 11/1 10. SCI education- will educate pt on SCI issues during inpt rehab. Will likely need Rehab physician in [place], [place] and in [place] in future. 11. BP controlled.  Normal 11/10 Vitals:   11/27/18 1925 11/28/18 0535  BP: 119/62 110/72  Pulse: 91 78  Resp: 19 18  Temp: 98.9 F (37.2 C) 98.3 F (36.8 C)  SpO2: 100% 99%    Dispo  11/9- d/c Thursday 11/12- going to [place] for 1-2 weeks, then going back to [place]  LOS: 15 days A FACE TO [place] 11/28/2018, 8:53 AM

## 2018-11-28 NOTE — Progress Notes (Signed)
Physical Therapy Session Note  Patient Details  Name: Joseph Reeves MRN: [id number] Date of Birth: 08-11-1965  Today's Date: 11/28/2018 PT Individual Time: 0900-1000; 1130-1200; [id number] PT Individual Time Calculation (min): 60 min and 30 min and 98 min  Short Term Goals: Week 2:  PT Short Term Goal 1 (Week 2): =LTG due to ELOS  Skilled Therapeutic Interventions/Progress Updates:    Session 1: Pt received seated in recliner in room, agreeable to PT session. No complaints of pain but does report muscle soreness in lower back musculature from extensive time spent standing previous date in [place]. Sit to stand with Supervision to stedy. Stedy transfer to w/c. Set pt up with w/c he will d/c home with, adjusted leg rests and anti-tippers for improved fit and safety. Pt is at mod I level for w/c mobility and management of w/c parts. Squat pivot transfer w/c to mat table mod I. Reviewed placement of electrodes for NMES to gastroc, soleus, and anterior tibialis muscles. Reviewed settings on estim machine, provided handout for where to purchase estim machine, reviewed precautions and contraindications of machine use. Pt demos good understanding of education and good safety awareness. Provided handouts of LE motor points for more accurate placement of electrodes. Pt left seated in w/c in therapy gym for handoff to OT session.  Session 2: Pt received seated in w/c in room, agreeable to PT session. No complaints of pain. Manual w/c propulsion 2 x 150 ft mod I. Squat pivot transfer w/c to/from mat table mod I. Sit to supine mod I. Demonstration of how to perform bridges, pt is unable to clear buttocks from mat table with bridge. Pt attempts to perform glute squeeze, trace muscle activation noted. Supine to sidelying mod I. Sidelying AAROM hip flex/ext x 10 reps on powder board with manual assist needed for extension>flexion. Sidelying knee flex/ext on powder board with AROM x 10 reps. Pt returns to  sitting EOM mod I, squat pivot transfer back to w/c mod I. Squat pivot transfer back to recliner mod I. Pt left seated in recliner in room with needs in reach at end of session.  Session 3: Pt received seated in recliner in room, agreeable to PT session. No complaints of pain. Squat pivot transfer recliner to bed at mod I level. Sit to supine and supine to sidelying mod I. NMES level 22 x 10 min to R glute max for neuromuscular re-education with focus on performing glute squeeze during on cycle of NMES. Reviewed placement of electrodes and management of estim unit. Pt demos good understanding. Pt returns to sitting EOB at mod I level. Squat pivot transfer to w/c mod I. Manual w/c propulsion x 150 ft with use of BUE at mod I level. Car transfer via squat pivot to 27" seat height with close Supervision to simulate pt's [employer] and to 36" car seat height with min A to simulate [employer]. Education with patient about decreased safety and increased assist needed with transfer to his truck vs [place]. Pt in agreement that transfer to lower vehicle is safer and agreeable to consider riding home in safer vehicle. Pt is planning on riding home x 10 hours in the car, education with patient about position changes and getting in/out of the car at rest stops. Pt overall receptive to education. Sit to stand in // bars with CGA. Standing mini-squats 2 x 5 reps with focus on decreased UE support to return to full standing. Pt continues to exhibit weak gluteal musculature and struggles to perform hip  extension without UE assist. Pt also reports tightness in heel cord region, reviewed several stretches pt can perform to address heel cord tightness. Standing marches x 5 reps with focus on decreased UE support, pt unable to maintain upright stance on one LE without significant UE support. Pt is mod I for transfer w/c to shower chair. Pt is mod I for showering overall. Pt requests to use bathroom at end of session. Stedy  transfer to toilet. Pt left seated on toilet with call button in reach.  Therapy Documentation Precautions:  Precautions Precautions: Back, Fall Precaution Booklet Issued: No Required Braces or Orthoses: Spinal Brace Spinal Brace: Lumbar corset, Applied in sitting position Restrictions Weight Bearing Restrictions: No    Therapy/Group: Individual Therapy   Peter Congo, PT, DPT  11/28/2018, 12:53 PM

## 2018-11-29 ENCOUNTER — Inpatient Hospital Stay (HOSPITAL_COMMUNITY): Payer: Self-pay | Admitting: Occupational Therapy

## 2018-11-29 ENCOUNTER — Ambulatory Visit (HOSPITAL_COMMUNITY): Payer: Self-pay | Admitting: *Deleted

## 2018-11-29 ENCOUNTER — Inpatient Hospital Stay (HOSPITAL_COMMUNITY): Payer: Self-pay | Admitting: Physical Therapy

## 2018-11-29 ENCOUNTER — Inpatient Hospital Stay (HOSPITAL_COMMUNITY): Payer: Self-pay

## 2018-11-29 MED ORDER — BACLOFEN 10 MG PO TABS: 10.0000 mg | ORAL_TABLET | Freq: Every day | ORAL | 1 refills | Status: AC

## 2018-11-29 MED ORDER — TAMSULOSIN HCL 0.4 MG PO CAPS: 0.4000 mg | ORAL_CAPSULE | Freq: Every day | ORAL | 1 refills | Status: AC

## 2018-11-29 MED ORDER — GLYCERIN (LAXATIVE) 2.1 G RE SUPP: 1.0000 | Freq: Every day | RECTAL | 0 refills | Status: AC | PRN

## 2018-11-29 MED ORDER — POLYETHYLENE GLYCOL 3350 17 G PO PACK: 17.0000 g | PACK | Freq: Every day | ORAL | 0 refills | Status: AC

## 2018-11-29 MED ORDER — BACLOFEN 5 MG PO TABS: 5.0000 mg | ORAL_TABLET | Freq: Three times a day (TID) | ORAL | 1 refills | Status: AC

## 2018-11-29 MED ORDER — ACETAMINOPHEN 325 MG PO TABS: 650.0000 mg | ORAL_TABLET | Freq: Four times a day (QID) | ORAL | Status: AC | PRN

## 2018-11-29 MED ORDER — RIVAROXABAN 15 MG PO TABS: 15.0000 mg | ORAL_TABLET | Freq: Two times a day (BID) | ORAL | 0 refills | Status: AC

## 2018-11-29 MED ORDER — GABAPENTIN 300 MG PO CAPS: 600.0000 mg | ORAL_CAPSULE | Freq: Two times a day (BID) | ORAL | 1 refills | Status: AC

## 2018-11-29 MED ORDER — SENNOSIDES-DOCUSATE SODIUM 8.6-50 MG PO TABS: 1.0000 | ORAL_TABLET | Freq: Two times a day (BID) | ORAL | Status: AC

## 2018-11-29 MED ORDER — HYDROCODONE-ACETAMINOPHEN 5-325 MG PO TABS: 1.0000 | ORAL_TABLET | ORAL | 0 refills | Status: AC | PRN

## 2018-11-29 MED ORDER — RIVAROXABAN 20 MG PO TABS: 20.0000 mg | ORAL_TABLET | Freq: Every day | ORAL | 1 refills | Status: AC

## 2018-11-29 NOTE — Progress Notes (Signed)
[place] PHYSICAL MEDICINE & REHABILITATION PROGRESS NOTE   Subjective/Complaints:  Pt reports no issues; per PT, getting HEP in gym.  ROS: denies CP, SOB, N/V/D Objective:   No results found. Recent Labs    11/27/18 0630  WBC 4.3  HGB 11.1*  HCT 34.8*  PLT 429*   Recent Labs    11/27/18 0630  NA 139  K 4.1  CL 103  CO2 28  GLUCOSE 119*  BUN 17  CREATININE 1.10  CALCIUM 9.2    Intake/Output Summary (Last 24 hours) at 11/29/2018 0855 Last data filed at 11/29/2018 0745 Gross per 24 hour  Intake 880 ml  Output -  Net 880 ml     Physical Exam: Vital Signs Blood pressure 115/71, pulse 75, temperature 98.1 F (36.7 C), temperature source Oral, resp. rate 18, weight 94.3 kg, SpO2 98 %.  Physical Exam Nursing noteand vitalsreviewed. Constitutional: sitting up in gym; with PT,working on learning HEP;  NAD HENT:  Head:Normocephalicand atraumatic.  Nose:Nose normal.  Mouth/Throat: Nooropharyngeal exudate.  Eyes:.conjugate gaze Neck:Normal range of motion.Neck supple. Cardiovascular:  RRR  Respiratory: CTA B/L with no wheezes, rales or rhonchi heard UX:NATF, NT, ND, (+)BS Musculoskeletal:  General: No tenderness; mild/trace edema of R foot/ankle, esp lateral ankle.  Comments: UEs 5/5 in deltoids, biceps, WE, triceps, grip and finger abd B/L LEs- RLE HF 2-5, KE 4/5, DF 3/5, PF 1/5, EHL 2/5 LLE HF 3+/5 stronger than R), KE 3/5, DF 3/5, PF 3/5, EHL 2/5- can wiggle toes now on R foot  Neurological: He is alertand oriented to person, place, and time. Nocranial nerve deficit. Patient is alert . Follows commands. Oriented x3. Decreased sensation to deep pressure but has intackt LT in LEs- tested dermatomes on arms, torso and legs- otherwise intact everywhere else. Flaccid partial paralysis Psych: bright affect   Assessment/Plan: 1. Functional deficits secondary to cauda equina syndrome  which require 3+ hours per day of  interdisciplinary therapy in a comprehensive inpatient rehab setting.  Physiatrist is providing close team supervision and 24 hour management of active medical problems listed below.  Physiatrist and rehab team continue to assess barriers to discharge/monitor patient progress toward functional and medical goals  Care Tool:  Bathing    Body parts bathed by patient: Right arm, Left arm, Chest, Abdomen, Front perineal area, Right upper leg, Left upper leg, Face, Buttocks, Right lower leg, Left lower leg   Body parts bathed by helper: Buttocks     Bathing assist Assist Level: Set up assist     Upper Body Dressing/Undressing Upper body dressing   What is the patient wearing?: Pull over shirt, Orthosis Orthosis activity level: Performed by patient  Upper body assist Assist Level: Independent    Lower Body Dressing/Undressing Lower body dressing      What is the patient wearing?: Pants     Lower body assist Assist for lower body dressing: Independent with assitive device     Toileting Toileting    Toileting assist Assist for toileting: Set up assist     Transfers Chair/bed transfer  Transfers assist  Chair/bed transfer activity did not occur: Safety/medical concerns  Chair/bed transfer assist level: Independent with assistive device Chair/bed transfer assistive device: Armrests   Locomotion Ambulation   Ambulation assist   Ambulation activity did not occur: Safety/medical concerns  Assist level: Dependent - Patient 0%(in LiteGait) Assistive device: Lite Gait Max distance: 185'   Walk 10 feet activity   Assist  Walk 10 feet activity did not occur:  Safety/medical concerns  Assist level: Dependent - Patient 0% Assistive device: Lite Gait   Walk 50 feet activity   Assist Walk 50 feet with 2 turns activity did not occur: Safety/medical concerns  Assist level: Dependent - Patient 0% Assistive device: Lite Gait    Walk 150 feet activity   Assist  Walk 150 feet activity did not occur: Safety/medical concerns  Assist level: Dependent - Patient 0% Assistive device: Lite Gait    Walk 10 feet on uneven surface  activity   Assist Walk 10 feet on uneven surfaces activity did not occur: Safety/medical concerns         Wheelchair     Assist Will patient use wheelchair at discharge?: Yes Type of Wheelchair: Manual Wheelchair activity did not occur: Safety/medical concerns  Wheelchair assist level: Independent Max wheelchair distance: 200'    Wheelchair 50 feet with 2 turns activity    Assist    Wheelchair 50 feet with 2 turns activity did not occur: Safety/medical concerns   Assist Level: Independent   Wheelchair 150 feet activity     Assist  Wheelchair 150 feet activity did not occur: Safety/medical concerns   Assist Level: Independent   Blood pressure 115/71, pulse 75, temperature 98.1 F (36.7 C), temperature source Oral, resp. rate 18, weight 94.3 kg, SpO2 98 %.  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  10/31- some improvement in RLE noted  11/7 Xray LS spine, fusion L2-3 normal post op appearance  2. Antithrombotics: -DVT/ 10/28- has R  peroneal/post tibial , Left peroneal DVT- 11/6- Changed to Xarelto- will need to be on x 3 months total -antiplatelet therapy: N/A 3. Pain Management:Hydrocodone as needed  10/28- Start Gabapentin 300 mg TID- hopefully will kick in soon, Baclofen 5 mg TID and con't Norco prn  10/29- pain better controlled- will increase nighttime Baclofen to 10 mg  11/3- will increase gabapentin to 600 mg BID and monitor for sleepiness 4. Mood:Provide emotional support -antipsychotic agents: N/A 5. Neuropsych: This patientiscapable of making decisions on hisown behalf. 6. Skin/Wound Care:Routine  skin checks 7. Fluids/Electrolytes/Nutrition:Routine in and outs with follow-up chemistries 8. Neurogenicbowel andbladder. Need to discuss removing Foley tube and begin voiding trial- however since he had >1L of volume in bladder and bladder has been stretched, Urology usually asks [place] to wait ~ 1 week of bladder rest to remove foley- will discuss starting Flomax to maximize potential of pt voiding after foley removed. - pt likely has neurogenic bowel and is during spinal shock- gut doesn't work during spinal shock and needs laxatives daily, as well as suppository nightly as part of bowel program- if pt unable to go on his own today, will put in for Suppository (enemas usually don't work in cauda equina syndrome patients) for this evening.  10/30- emptying and voiding and having BMs- continent- no caths required in 24+ hrs. Will see if can stop Flomax last week of rehab.  11/9- LBM this AM 9. Postoperative anemia. CBC from 11/2 shows  Hgb of 10.8.  11/9 Hb 11/1 10. SCI education- will educate pt on SCI issues during inpt rehab. Will likely need Rehab physician in [place], [place] and in [place] in future. 11. BP controlled. Normal 11/10 Vitals:   11/28/18 2133 11/29/18 0456  BP: 124/78 115/71  Pulse: 86 75  Resp: 18 18  Temp: 98.2 F (36.8 C) 98.1 F (36.7 C)  SpO2:  97% 98%    Dispo  11/9- d/c Thursday [place] [facility]  11/11- d/c tomorrow  LOS: 16 days A FACE TO FACE EVALUATION WAS PERFORMED  Sheyenne Konz 11/29/2018, 8:55 AM

## 2018-11-29 NOTE — Progress Notes (Signed)
Recreational Therapy Session Note  Patient Details  Name: Joseph Reeves MRN: [id number] Date of Birth: 04/28/1965 Today's Date: 11/29/2018 Time:  1103-1230 Pain: no c/o Skilled Therapeutic Interventions/Progress Updates: Session focused on community reintegration w/c level during co-treat with PT.  Pt propelled w/c throughout the hospital and outside on various surface types (including accessing elevators, up/down ramps, opening doors) with Mod I using BUEs.  Pt practiced squat pivot transfers from w/c to multiple community seating surfaces with Mod I.  We problem solved through public restroom access.  Pt stated he planned to use a urinal & demonstrated set up with Mod I.  Problem solved toilet transfer for BM and recommended supervision for that. Pt also navigated [employer] including ordering meal and transporting meal back to his room with Mod I.  Education provided on identification and negotiation of obstacles, energy conservation, and safety concerns during community pursuits.  Pt stated understanding.  Therapy/Group: Co-Treatment Dakia Schifano 11/29/2018, 1:10 PM

## 2018-11-29 NOTE — Progress Notes (Signed)
Physical Therapy Discharge Summary  Patient Details  Name: Joseph Reeves MRN: [id number] Date of Birth: 07/09/1965  Today's Date: 11/29/2018   Patient has met 6 of 7 long term goals due to improved activity tolerance, improved balance, improved postural control, increased strength, increased range of motion, decreased pain and ability to compensate for deficits.  Patient to discharge at a wheelchair level Modified Independent.   Patient's care partner is independent to provide the necessary physical assistance at discharge as pt just requires Supervision for a car transfer and family is able to provide this level of assist.  Reasons goals not met: Patient did not meet Supervision for sit to stand goal as he is occasionally Supervision but sometimes CGA for this transfer once fatigued.  Recommendation:  Patient will benefit from ongoing skilled PT services in home health setting to continue to advance safe functional mobility, address ongoing impairments in LE strength, motor control, endurance, standing balance/tolerance, and minimize fall risk.  Equipment: 18x18 manual w/c  Reasons for discharge: treatment goals met and discharge from hospital  Patient/family agrees with progress made and goals achieved: Yes  PT Discharge Precautions/Restrictions Precautions Precautions: Back;Fall Required Braces or Orthoses: Spinal Brace Spinal Brace: Lumbar corset;Applied in sitting position Restrictions Weight Bearing Restrictions: No Vision/Perception  Perception Perception: Within Functional Limits Praxis Praxis: Intact  Cognition Overall Cognitive Status: Within Functional Limits for tasks assessed Arousal/Alertness: Awake/alert Orientation Level: Oriented X4 Attention: Focused Focused Attention: Appears intact Memory: Appears intact Awareness: Appears intact Safety/Judgment: Appears intact Sensation Sensation Light Touch: Impaired Detail Light Touch Impaired Details:  Impaired RLE Proprioception: Impaired Detail Proprioception Impaired Details: Impaired RLE Coordination Gross Motor Movements are Fluid and Coordinated: No Fine Motor Movements are Fluid and Coordinated: Yes Coordination and Movement Description: impaired 2/2 cauda equina/incomplete paraplegia R>L Motor  Motor Motor: Paraplegia;Abnormal tone;Abnormal postural alignment and control Motor - Skilled Clinical Observations: impaired 2/2 cauda equina Motor - Discharge Observations: Incomplete paraplegia R>L  Mobility Bed Mobility Bed Mobility: Rolling Right;Rolling Left;Supine to Sit;Sit to Supine Rolling Right: Independent Rolling Left: Independent Supine to Sit: Independent Sit to Supine: Independent Transfers Transfers: Sit to [employer];Lateral/Scoot Transfers Sit to Stand: Charity fundraiser Transfers: Independent with assistive device Lateral/Scoot Transfers: Independent with assistive device Transfer (Assistive device): None Locomotion  Gait Ambulation: Yes Gait Assistance: Dependent - mechanical lift Gait Distance (Feet): 185 Feet Assistive device: Body weight support system Gait Gait: Yes Gait Pattern: Impaired Gait Pattern: Decreased step length - right;Decreased stance time - right;Scissoring;Trunk flexed;Narrow base of support Gait velocity: 0.5 mph on treadmill in [employer] / Additional Locomotion Stairs: No Ramp: Independent with assistive Production manager Mobility: Yes Wheelchair Assistance: Independent with Camera operator: Both upper extremities Wheelchair Parts Management: Independent Distance: 1000  Trunk/Postural Assessment  Cervical Assessment Cervical Assessment: Within Functional Limits Thoracic Assessment Thoracic Assessment: Exceptions to WFL(back precautions) Lumbar Assessment Lumbar Assessment: Exceptions to WFL(lumbar corset; back precautions) Postural  Control Postural Control: Within Functional Limits  Balance Balance Balance Assessed: Yes Static Sitting Balance Static Sitting - Balance Support: No upper extremity supported;Feet supported Static Sitting - Level of Assistance: 7: Independent Dynamic Sitting Balance Dynamic Sitting - Balance Support: No upper extremity supported;During functional activity;Feet supported Dynamic Sitting - Level of Assistance: 6: Modified independent (Device/Increase time) Static Standing Balance Static Standing - Balance Support: Bilateral upper extremity supported;During functional activity Static Standing - Level of Assistance: 4: Min assist Dynamic Standing Balance Dynamic Standing - Balance Support: Bilateral upper extremity supported;During functional  activity Dynamic Standing - Level of Assistance: 3: Mod assist Extremity Assessment   RLE Assessment RLE Assessment: Exceptions to [facility] General Strength Comments: impaired, see below RLE Strength Right Hip Flexion: 3+/5 Right Knee Flexion: 4/5 Right Knee Extension: 3/5 Right Ankle Dorsiflexion: 1/5 LLE Assessment LLE Assessment: Exceptions to [facility] General Strength Comments: impaired, see below LLE Strength Left Hip Flexion: 3/5 Left Knee Flexion: 4+/5 Left Knee Extension: 5/5 Left Ankle Dorsiflexion: 3+/5     Excell Seltzer, PT, DPT 11/29/2018, 3:38 PM

## 2018-11-29 NOTE — Discharge Instructions (Signed)
Inpatient Rehab Discharge Instructions  Joseph Reeves Discharge date and time: No discharge date for patient encounter.   Activities/Precautions/ Functional Status: Activity: Lumbar corset when out of bed Diet: regular diet Wound Care: keep wound clean and dry Functional status:  ___ No restrictions     ___ Walk up steps independently ___ 24/7 supervision/assistance   ___ Walk up steps with assistance ___ Intermittent supervision/assistance  ___ Bathe/dress independently ___ Walk with walker     _x__ Bathe/dress with assistance ___ Walk Independently    ___ Shower independently ___ Walk with assistance    ___ Shower with assistance ___ No alcohol     ___ Return to work/school ________  Special Instructions: No driving smoking or alcohol  Follow up Neurosurgery   My questions have been answered and I understand these instructions. I will adhere to these goals and the provided educational materials after my discharge from the hospital.  Patient/Caregiver Signature _______________________________ Date __________  Clinician Signature _______________________________________ Date __________  Please bring this form and your medication list with you to all your follow-up doctor's appointments.  Information on my medicine - XARELTO (rivaroxaban)  This medication education was reviewed with me or my healthcare representative as part of my discharge preparation.  The pharmacist that spoke with me during my hospital stay was:  Yujing Z  [place],, [place]? Xarelto was prescribed to treat blood clots that may have been found in the veins of your legs (deep vein thrombosis) or in your lungs (pulmonary embolism) and to reduce the risk of them occurring again.  What do you need to know about Xarelto? The starting dose is one 15 mg tablet taken TWICE daily with food for the FIRST 21 DAYS then on (enter date) 11/26  the dose is changed to one 20 mg  tablet taken ONCE A DAY with your evening meal.  DO NOT stop taking Xarelto without talking to the health care provider who prescribed the medication.  Refill your prescription for 20 mg tablets before you run out.  After discharge, you should have regular check-up appointments with your healthcare provider that is prescribing your Xarelto.  In the future your dose may need to be changed if your kidney function changes by a significant amount.  What do you do if you miss a dose? If you are taking Xarelto TWICE DAILY and you miss a dose, take it as soon as you remember. You may take two 15 mg tablets (total 30 mg) at the same time then resume your regularly scheduled 15 mg twice daily the next day.  If you are taking Xarelto ONCE DAILY and you miss a dose, take it as soon as you remember on the same day then continue your regularly scheduled once daily regimen the next day. Do not take two doses of Xarelto at the same time.   Important Safety Information Xarelto is a blood thinner medicine that can cause bleeding. You should call your healthcare provider right away if you experience any of the following: ? Bleeding from an injury or your nose that does not stop. ? Unusual colored urine (red or dark brown) or unusual colored stools (red or black). ? Unusual bruising for unknown reasons. ? A serious fall or if you hit your head (even if there is no bleeding).  Some medicines may interact with Xarelto and might increase your risk of bleeding while on Xarelto. To help avoid this, consult your healthcare provider or pharmacist prior to  using any new prescription or non-prescription medications, including herbals, vitamins, non-steroidal anti-inflammatory drugs (NSAIDs) and supplements.  This website has more information on Xarelto: [email or web address].com.br.

## 2018-11-29 NOTE — Progress Notes (Signed)
Occupational Therapy Session Note  Patient Details  Name: Joseph Reeves MRN: [id number] Date of Birth: 10-03-1965  Today's Date: 11/29/2018 OT Individual Time: 2119-4174 OT Individual Time Calculation (min): 60 min    Short Term Goals: Week 2:  OT Short Term Goal 1 (Week 2): STG=LTG due to LOS  Skilled Therapeutic Interventions/Progress Updates:    Upon entering the room, pt seated in recliner chair with sister present in room. OT educating pt and caregiver on plan for discharge date, expectations, and equipment ordered. Social worker arrived and provided further education in regards to discharge as well. Pt donned B shoes with figure four position and mod I. Pt transferred into wheelchair at mod I level and propelled wheelchair into hall around cone obstacle and over various thresholds at mod I level. Pt standing in parallel bars x5 reps with increasing fatigue in R quad. Pt ambulating 7' down bars but with heavy use of B UEs.Pt standing and performing 2 sets of 3 reps mini squats with min A to block knee secondary to fatigue. Pt propelled wheelchair back to room at end of session and transferred back into recliner chair as he is mod I in room. All needs within reach.   Therapy Documentation Precautions:  Precautions Precautions: Back, Fall Precaution Booklet Issued: No Required Braces or Orthoses: Spinal Brace Spinal Brace: Lumbar corset, Applied in sitting position Restrictions Weight Bearing Restrictions: No Vital Signs: Therapy Vitals Temp: 98.1 F (36.7 C) Pulse Rate: 90 Resp: 18 BP: 124/82 Patient Position (if appropriate): Sitting Oxygen Therapy SpO2: 100 % O2 Device: Room Air Perception  Perception: Within Functional Limits Praxis Praxis: Intact Exercises:   Other Treatments:     Therapy/Group: Individual Therapy  Gypsy Decant 11/29/2018, 4:22 PM

## 2018-11-29 NOTE — Progress Notes (Signed)
Physical Therapy Session Note  Patient Details  Name: Joseph Reeves MRN: [id number] Date of Birth: May 22, 1965  Today's Date: 11/29/2018 PT Individual Time: 1000-1028 PT Individual Time Calculation (min): 28 min   Short Term Goals:  Week 2:  PT Short Term Goal 1 (Week 2): =LTG due to ELOS Week 3:     Skilled Therapeutic Interventions/Progress Updates:   Pt resting in recliner.  No pain reported.  neuromuscular re-education via forced use, demo and multimodal cues for terminal hip extension in standing in [place]; in high sitting on [place]: motor control for quads with isometric holds x 3 seconds; L heel cord eccentric control for heel lifts, R resisted small excursion ankle PF, resisted hip adduction/abduction     Therapy Documentation Precautions:  Precautions Precautions: Back, Fall Precaution Booklet Issued: No Required Braces or Orthoses: Spinal Brace Spinal Brace: Lumbar corset, Applied in sitting position Restrictions Weight Bearing Restrictions: No       Therapy/Group: Individual Therapy  Joseph Reeves 11/29/2018, 10:42 AM

## 2018-11-29 NOTE — Progress Notes (Signed)
Physical Therapy Session Note  Patient Details  Name: Joseph Reeves MRN: [id number] Date of Birth: 18-Apr-1965  Today's Date: 11/29/2018 PT Individual Time: 0800-0900; 1100-1230 PT Individual Time Calculation (min): 60 min and 90 min  Short Term Goals: Week 2:  PT Short Term Goal 1 (Week 2): =LTG due to ELOS  Skilled Therapeutic Interventions/Progress Updates:    Session 1: Pt received seated in recliner in room, agreeable to PT session. No complaints of pain. Squat pivot transfer recliner to w/c mod I. Pt is at mod I level for w/c mobility. Squat pivot transfer w/c to/from mat table mod I. Provided handout for additional HEP: bridges, glute sets, gastroc and soleus stretching, and sidelying AAROM hip flex/extension. Pt demos good understanding of how to perform exercises and stretches. Pt will good insight and well-thought out questions regarding HEP. Seated bicycle x 5 min forward/5 min backward for BLE strengthening. Squat pivot transfer back to recliner mod I. Pt left seated in recliner in room with needs in reach at end of session.  Session 2: Pt received seated in recliner in room, agreeable to PT session. No complaints of pain. Squat pivot transfer recliner to/from w/c at mod I level. Manual w/c propulsion x 1000 ft (+) with use of BUE at mod I level navigating functional environment of hospital hallways, elevators, doorways/ up/down ramp outdoors, parking garage, etc while discussing environments patient may encounter on his journey home as well as when out in the community once he is home. Squat pivot transfer to various functional surfaces of restaurant booth, waiting room chair, etc at mod I level. Simulation and discussion of functional transfer in public restroom reviewing how to safely enter a bathroom, choosing a stall, setting up w/c safely for transfer to/from toilet, etc. Also recommending pt have 2nd person present for transfers in a public restroom even though he will be mod  I for toilet transfers at home. Pt understanding of and receptive to all education provided. Reviewed how to maintain spinal precautions while performing functional mobility in the community as well. Sit to stand in // bars with CGA. Standing RLE TKE x 3 reps with quick onset of fatigue of R quad muscle. Ambulation 2 x 5' in // bars with pt reporting 60% UB support on // bars for gait, w/c follow. Ambulation 2 x 5' with RW inside // bars for improved safety, w/c follow. Pt fatigues more quickly with use of RW as well as decreased safety and balance noted. Manual w/c propulsion navigating functional environment of [place] with pt about to read menu, order, and pick up his food at mod I level. Pt is able to navigate back to his room while carrying food via w/c propulsion at mod I level. Pt is safe to be mod I for transfers to his w/c in his room. Pt left seated in recliner in room with needs in reach at end of session. Cotreatment session with Recreational Therapy.   Therapy Documentation Precautions:  Precautions Precautions: Back, Fall Precaution Booklet Issued: No Required Braces or Orthoses: Spinal Brace Spinal Brace: Lumbar corset, Applied in sitting position Restrictions Weight Bearing Restrictions: No    Therapy/Group: Individual Therapy   Excell Seltzer, PT, DPT  11/29/2018, 10:35 AM

## 2018-11-30 NOTE — Progress Notes (Signed)
Recreational Therapy Discharge Summary Patient Details  Name: Briscoe Daniello MRN: [id number] Date of Birth: 11/27/1965 Today's Date: 11/30/2018  Long term goals set: 1  Long term goals met: 1  Comments on progress toward goals: Pt has made great progress during LOS and is discharging home today with family at Mod I w/c level.  TR sessions focused on activity tolerance, pt education, & community reintegration.  Pt met Mod I level for community reintegration tasks, but supervision is recommended for public restroom toilet transfers.  Pt anxious for discharge today and his return to [place] in the weeks ahead.  Goal met. Reasons for discharge: discharge from hospital  Patient/family agrees with progress made and goals achieved: Yes  Michelangelo Rindfleisch 11/30/2018, 8:09 AM

## 2018-11-30 NOTE — Plan of Care (Signed)
  Problem: Consults Goal: RH GENERAL PATIENT EDUCATION Description: See Patient Education module for education specifics. Outcome: Completed/Met Goal: Skin Care Protocol Initiated - if Braden Score 18 or less Description: If consults are not indicated, leave blank or document N/A Outcome: Completed/Met   Problem: RH BOWEL ELIMINATION Goal: RH STG MANAGE BOWEL WITH ASSISTANCE Description: STG Manage Bowel with mod I Assistance. Outcome: Completed/Met Goal: RH STG MANAGE BOWEL W/MEDICATION W/ASSISTANCE Description: STG Manage Bowel with Medication with mod I Assistance. Outcome: Completed/Met   Problem: RH BLADDER ELIMINATION Goal: RH STG MANAGE BLADDER WITH ASSISTANCE Description: STG Manage Bladder With min Assistance Outcome: Completed/Met Goal: RH STG MANAGE BLADDER WITH EQUIPMENT WITH ASSISTANCE Description: STG Manage Bladder With Equipment With min Assistance Outcome: Completed/Met   Problem: RH SAFETY Goal: RH STG ADHERE TO SAFETY PRECAUTIONS W/ASSISTANCE/DEVICE Description: STG Adhere to Safety Precautions With supervision Assistance/Device. Outcome: Completed/Met   Problem: RH PAIN MANAGEMENT Goal: RH STG PAIN MANAGED AT OR BELOW PT'S PAIN GOAL Description: Less than 4 on 0-10 scale Outcome: Completed/Met   Problem: RH KNOWLEDGE DEFICIT GENERAL Goal: RH STG INCREASE KNOWLEDGE OF SELF CARE AFTER HOSPITALIZATION Description: Pt will identify safety and spinal precautions along with medication compliance to manage bowel function prior to DC  Outcome: Completed/Met

## 2018-11-30 NOTE — Progress Notes (Signed)
[facility] PHYSICAL MEDICINE & REHABILITATION PROGRESS NOTE   Subjective/Complaints: Ready for d/c today   ROS: denies CP, SOB, N/V/D Objective:   No results found. No results for input(s): WBC, HGB, HCT, PLT in the last 72 hours. No results for input(s): NA, K, CL, CO2, GLUCOSE, BUN, CREATININE, CALCIUM in the last 72 hours.  Intake/Output Summary (Last 24 hours) at 11/30/2018 0916 Last data filed at 11/30/2018 0847 Gross per 24 hour  Intake 840 ml  Output 400 ml  Net 440 ml     Physical Exam: Vital Signs Blood pressure 118/74, pulse 74, temperature 98.2 F (36.8 C), temperature source Oral, resp. rate 18, weight 94.3 kg, SpO2 99 %.  Physical Exam Nursing noteand vitalsreviewed. Constitutional: ;  NAD HENT:  Head:Normocephalicand atraumatic.  Nose:Nose normal.  Mouth/Throat: Nooropharyngeal exudate.  Eyes:.conjugate gaze Neck:Normal range of motion.Neck supple. Cardiovascular:  RRR  Respiratory: CTA B/L with no wheezes, rales or rhonchi heard AV:WUJWGI:soft, NT, ND, (+)BS Musculoskeletal:  General: No tenderness; mild/trace edema of R foot/ankle, esp lateral ankle.  Comments: UEs 5/5 in deltoids, biceps, WE, triceps, grip and finger abd B/L LEs- RLE HF 2-5, KE 4/5, DF 3/5, PF 1/5, EHL 2/5 LLE HF 3+/5 stronger than R), KE 3/5, DF 3/5, PF 3/5, EHL 2/5- can wiggle toes now on R foot  Neurological: He is alertand oriented to person, place, and time. Nocranial nerve deficit. Patient is alert . Follows commands. Oriented x3. Decreased sensation to deep pressure but has intackt LT in LEs- tested dermatomes on arms, torso and legs- otherwise intact everywhere else. Flaccid partial paralysis Psych: bright affect   Assessment/Plan: 1. Functional deficits secondary to cauda equina syndrome  which require 3+ hours per day of interdisciplinary therapy in a comprehensive inpatient rehab setting.  Physiatrist is providing close team supervision and  24 hour management of active medical problems listed below.  Physiatrist and rehab team continue to assess barriers to discharge/monitor patient progress toward functional and medical goals  Care Tool:  Bathing    Body parts bathed by patient: Right arm, Left arm, Chest, Abdomen, Front perineal area, Right upper leg, Left upper leg, Face, Buttocks, Right lower leg, Left lower leg   Body parts bathed by helper: Buttocks     Bathing assist Assist Level: Independent with assistive device(per staff report)     Upper Body Dressing/Undressing Upper body dressing   What is the patient wearing?: Pull over shirt, Orthosis Orthosis activity level: Performed by patient  Upper body assist Assist Level: Independent(per staff report)    Lower Body Dressing/Undressing Lower body dressing      What is the patient wearing?: Pants     Lower body assist Assist for lower body dressing: Independent with assitive device(per staff report)     Toileting Toileting    Toileting assist Assist for toileting: Supervision/Verbal cueing(per staff report)     Transfers Chair/bed transfer  Transfers assist  Chair/bed transfer activity did not occur: Safety/medical concerns  Chair/bed transfer assist level: Independent with assistive device Chair/bed transfer assistive device: Armrests   Locomotion Ambulation   Ambulation assist   Ambulation activity did not occur: Safety/medical concerns  Assist level: Dependent - Patient 0% Assistive device: Lite Gait Max distance: 185'   Walk 10 feet activity   Assist  Walk 10 feet activity did not occur: Safety/medical concerns  Assist level: Dependent - Patient 0% Assistive device: Lite Gait   Walk 50 feet activity   Assist Walk 50 feet with 2 turns activity did  not occur: Safety/medical concerns  Assist level: Dependent - Patient 0% Assistive device: Lite Gait    Walk 150 feet activity   Assist Walk 150 feet activity did not occur:  Safety/medical concerns  Assist level: Dependent - Patient 0% Assistive device: Lite Gait    Walk 10 feet on uneven surface  activity   Assist Walk 10 feet on uneven surfaces activity did not occur: Safety/medical concerns         Wheelchair     Assist Will patient use wheelchair at discharge?: Yes Type of Wheelchair: Manual Wheelchair activity did not occur: Safety/medical concerns  Wheelchair assist level: Independent Max wheelchair distance: 200'    Wheelchair 50 feet with 2 turns activity    Assist    Wheelchair 50 feet with 2 turns activity did not occur: Safety/medical concerns   Assist Level: Independent   Wheelchair 150 feet activity     Assist  Wheelchair 150 feet activity did not occur: Safety/medical concerns   Assist Level: Independent   Blood pressure 118/74, pulse 74, temperature 98.2 F (36.8 C), temperature source Oral, resp. rate 18, weight 94.3 kg, SpO2 99 %.  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  10/31- some improvement in RLE noted  11/7 Xray LS spine, fusion L2-3 normal post op appearance  2. Antithrombotics: -DVT/ 10/28- has R  peroneal/post tibial , Left peroneal DVT- 11/6- Changed to Xarelto- will need to be on x 3 months total -antiplatelet therapy: N/A 3. Pain Management:Hydrocodone as needed  10/28- Start Gabapentin 300 mg TID- hopefully will kick in soon, Baclofen 5 mg TID and con't Norco prn  10/29- pain better controlled- will increase nighttime Baclofen to 10 mg  11/3- will increase gabapentin to 600 mg BID and monitor for sleepiness 4. Mood:Provide emotional support -antipsychotic agents: N/A 5. Neuropsych: This patientiscapable of making decisions on hisown behalf. 6. Skin/Wound Care:Routine skin checks 7.  Fluids/Electrolytes/Nutrition:Routine in and outs with follow-up chemistries 8. Neurogenicbowel andbladder. Need to discuss removing Foley tube and begin voiding trial- however since he had >1L of volume in bladder and bladder has been stretched, Urology usually asks [place] to wait ~ 1 week of bladder rest to remove foley- will discuss starting Flomax to maximize potential of pt voiding after foley removed. - pt likely has neurogenic bowel and is during spinal shock- gut doesn't work during spinal shock and needs laxatives daily, as well as suppository nightly as part of bowel program- if pt unable to go on his own today, will put in for Suppository (enemas usually don't work in cauda equina syndrome patients) for this evening.  10/30- emptying and voiding and having BMs- continent- no caths required in 24+ hrs. Will see if can stop Flomax last week of rehab.  11/9- LBM this AM 9. Postoperative anemia. CBC from 11/2 shows  Hgb of 10.8.  11/9 Hb 11/1 10. SCI education- will educate pt on SCI issues during inpt rehab. Will likely need Rehab physician in [place], [place] and in [place] in future. 11. BP controlled. Normal 11/12 Vitals:   11/29/18 2004 11/30/18 0631  BP: 118/71 118/74  Pulse: 88 74  Resp: 16 18  Temp: 98.7 F (37.1 C) 98.2 F (36.8 C)  SpO2: 97% 99%    Dispo  11/9- d/c Thursday 11/12- going to [place] for 1-2 weeks, then going back to [facility]  11/11- d/c tomorrow  11/12- d/c today  LOS: 17 days A FACE TO FACE EVALUATION WAS PERFORMED  Merl Bommarito 11/30/2018, 9:16 AM

## 2018-11-30 NOTE — Progress Notes (Signed)
Pt DC today to home. DC summary completed by Marlowe Shores, PA. Sister present for DC. Belongings packed. Pt and family have no questions/concerns noted. Will be transported to main lobby for DC.  Erie Noe, RN

## 2018-11-30 NOTE — Progress Notes (Signed)
Social Work Discharge Note   The overall goal for the admission was met for:   Discharge location: Yes - initially to stay with a friend ~ 1 week in [place] then will return to his home in [place]  Length of Stay: Yes - 17 days  Discharge activity level: Yes - modified independent w/c level  Home/community participation: Yes  Services provided included: MD, RD, PT, OT, RN, Pharmacy and [place]: uninsured - SSD application submitted while on CIR and telephone interview scheduled for 12/06/18.  Pt's sister is working on [employer].  Follow-up services arranged: DME: Wheelchair doanted by CIR to pt;  w/c cushion, 3n1 commode and tub bench via charity program with [place]   Have discussed at length with pt and his sisters that we do recommend therapy follow up, however, he does not expect to return to his home in [place] for ~ one week.  He will need to establish a primary care MD who can refer for therapies and a clinic who will see him with MA apps pending.  They have my contact information in order to let me know how they are doing with this process and any assistance I might be able to offer from here.  Referred pt to [facility] med assist program to secure first month supply and reiterated the need for primary med care/ community clinic in [place] to assist with ongoing med needs.  Comments (or additional information):  Patient/Family verbalized understanding of follow-up arrangements: Yes  Individual responsible for coordination of the follow-up plan: pt  Confirmed correct DME delivered: Cyan Moultrie 11/30/2018    Oprah Camarena

## 2019-01-16 ENCOUNTER — Other Ambulatory Visit: Payer: Self-pay | Admitting: Physical Medicine and Rehabilitation
